# Patient Record
Sex: Female | Born: 1945 | Race: White | Hispanic: No | Marital: Married | Smoking: Never smoker
Health system: Southern US, Community
[De-identification: ages and names within clinical notes are randomized; demographics above are authoritative.]

## PROBLEM LIST (undated history)

## (undated) DIAGNOSIS — E876 Hypokalemia: Secondary | ICD-10-CM

## (undated) DIAGNOSIS — C50919 Malignant neoplasm of unspecified site of unspecified female breast: Secondary | ICD-10-CM

## (undated) DIAGNOSIS — J939 Pneumothorax, unspecified: Secondary | ICD-10-CM

## (undated) DIAGNOSIS — Z9889 Other specified postprocedural states: Secondary | ICD-10-CM

## (undated) DIAGNOSIS — J189 Pneumonia, unspecified organism: Secondary | ICD-10-CM

## (undated) DIAGNOSIS — D696 Thrombocytopenia, unspecified: Secondary | ICD-10-CM

## (undated) DIAGNOSIS — I158 Other secondary hypertension: Secondary | ICD-10-CM

## (undated) DIAGNOSIS — R011 Cardiac murmur, unspecified: Secondary | ICD-10-CM

## (undated) DIAGNOSIS — D6851 Activated protein C resistance: Secondary | ICD-10-CM

## (undated) DIAGNOSIS — E7800 Pure hypercholesterolemia, unspecified: Secondary | ICD-10-CM

## (undated) DIAGNOSIS — D62 Acute posthemorrhagic anemia: Secondary | ICD-10-CM

## (undated) DIAGNOSIS — R739 Hyperglycemia, unspecified: Secondary | ICD-10-CM

## (undated) DIAGNOSIS — I340 Nonrheumatic mitral (valve) insufficiency: Secondary | ICD-10-CM

## (undated) DIAGNOSIS — I341 Nonrheumatic mitral (valve) prolapse: Secondary | ICD-10-CM

## (undated) DIAGNOSIS — D72829 Elevated white blood cell count, unspecified: Secondary | ICD-10-CM

## (undated) DIAGNOSIS — E871 Hypo-osmolality and hyponatremia: Secondary | ICD-10-CM

## (undated) HISTORY — DX: Malignant neoplasm of unspecified site of unspecified female breast: C50.919

## (undated) HISTORY — DX: Thrombocytopenia, unspecified: D69.6

## (undated) HISTORY — DX: Nonrheumatic mitral (valve) prolapse: I34.1

## (undated) HISTORY — DX: Nonrheumatic mitral (valve) insufficiency: I34.0

## (undated) HISTORY — DX: Hypokalemia: E87.6

## (undated) HISTORY — PX: MASTECTOMY: SHX3

## (undated) HISTORY — DX: Hyperglycemia, unspecified: R73.9

## (undated) HISTORY — DX: Acute posthemorrhagic anemia: D62

## (undated) HISTORY — DX: Other secondary hypertension: I15.8

## (undated) HISTORY — DX: Activated protein C resistance: D68.51

## (undated) HISTORY — DX: Hypo-osmolality and hyponatremia: E87.1

## (undated) HISTORY — DX: Pneumothorax, unspecified: J93.9

## (undated) HISTORY — DX: Pure hypercholesterolemia, unspecified: E78.00

## (undated) HISTORY — DX: Elevated white blood cell count, unspecified: D72.829

---
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
## 2008-11-23 HISTORY — PX: MASTECTOMY: SHX3

## 2009-11-23 DIAGNOSIS — J939 Pneumothorax, unspecified: Secondary | ICD-10-CM

## 2009-11-23 HISTORY — DX: Pneumothorax, unspecified: J93.9

## 2009-11-23 HISTORY — PX: ROTATOR CUFF REPAIR: SHX139

## 2010-11-23 DIAGNOSIS — C50919 Malignant neoplasm of unspecified site of unspecified female breast: Secondary | ICD-10-CM

## 2010-11-23 HISTORY — DX: Malignant neoplasm of unspecified site of unspecified female breast: C50.919

## 2014-08-30 LAB — PULMONARY FUNCTION TEST

## 2015-08-13 ENCOUNTER — Ambulatory Visit (INDEPENDENT_AMBULATORY_CARE_PROVIDER_SITE_OTHER): Payer: Medicare Other | Admitting: Pulmonary Disease

## 2015-08-13 ENCOUNTER — Ambulatory Visit (INDEPENDENT_AMBULATORY_CARE_PROVIDER_SITE_OTHER)
Admission: RE | Admit: 2015-08-13 | Discharge: 2015-08-13 | Disposition: A | Discharge status: Home / Self Care | Payer: Medicare Other | Source: Ambulatory Visit | Attending: Pulmonary Disease | Admitting: Pulmonary Disease

## 2015-08-13 ENCOUNTER — Encounter: Payer: Self-pay | Admitting: Pulmonary Disease

## 2015-08-13 VITALS — BP 118/66 | HR 58 | Ht 62.5 in | Wt 102.0 lb

## 2015-08-13 DIAGNOSIS — R05 Cough: Secondary | ICD-10-CM

## 2015-08-13 DIAGNOSIS — R059 Cough, unspecified: Secondary | ICD-10-CM

## 2015-08-13 HISTORY — DX: Cough, unspecified: R05.9

## 2015-08-13 MED ORDER — BECLOMETHASONE DIPROPIONATE 80 MCG/ACT IN AERS: 2.0000 | INHALATION_SPRAY | Freq: Two times a day (BID) | RESPIRATORY_TRACT | Status: DC

## 2015-08-13 NOTE — Progress Notes (Signed)
Subjective:    Patient ID: Laura Galvan, female    DOB: 08/29/1946, 69 y.o.   MRN: [id number]  HPI Chief Complaint  Patient presents with  . Advice Only    Pt here for chronic cough X4 mos.     Magdaline is here to see me today for a cough that's been persistent since May. She says it came out of the blue without any sort of preceding infection or other problem. She said that it has persisted since without much mucus production. She says that there is no difference when she lays flat or when she is awake. She has noticed that the cough is worse with talking. There does not seem to be a correlation with a meal. She says sometimes it feels like ice water may make her cough. She denies heartburn symptoms. She says that she will have allergic rhinitis type symptoms with postnasal drip and itchy eyes after a coughing spell that she does not have these before the coughing spell. She says the cough does cause her to wake up at night. After several months of expansion treatments she finally went to the doctor in the end of August and she was prescribed dymista, this did not help. She has also used a proton pump inhibitor twice a day as well as daily Zyrtec. She has been elevating the head of her bed. She's not certain if any of these have helped.  She's never smoked. She has a history of breast cancer and a mastectomy. She says these thinks that she had childhood asthma. She worked in [employer] house [employer].  About a year ago her oncologist said that she heard abnormal breath sounds and so she started with a chest x-ray, and then finally her primary care physician ordered a pulmonary function test and a CT scan. She believes that these were normal.   Past Medical History  Diagnosis Date  . Hypercholesteremia   . Mitral valve prolapse      Family History  Problem Relation Age of Onset  . Heart disease Father   . Clotting disorder      "whole family" per pt.   . Lung cancer Father   . Lung cancer Mother      Social History   Social History  . Marital Status: Married    Spouse Name: N/A  . Number of Children: N/A  . Years of Education: N/A   Occupational History  . Not on file.   Social History Main Topics  . Smoking status: Never Smoker   . Smokeless tobacco: Never Used  . Alcohol Use: Not on file  . Drug Use: Not on file  . Sexual Activity: Not on file   Other Topics Concern  . Not on file   Social History Narrative  . No narrative on file     Allergies  Allergen Reactions  . Sulfa Antibiotics     Childhood allergy     No outpatient prescriptions prior to visit.   No facility-administered medications prior to visit.       Review of Systems  Constitutional: Negative for fever and unexpected weight change.  HENT: Positive for rhinorrhea. Negative for congestion, dental problem, ear pain, nosebleeds, postnasal drip, sinus pressure, sneezing, sore throat and trouble swallowing.   Eyes: Negative for redness and itching.  Respiratory: Positive for cough. Negative for chest tightness, shortness of breath and wheezing.   Cardiovascular: Negative for palpitations and leg swelling.  Gastrointestinal: Negative for nausea and vomiting.  Genitourinary: Negative  for dysuria.  Musculoskeletal: Negative for joint swelling.  Skin: Negative for rash.  Neurological: Negative for headaches.  Hematological: Does not bruise/bleed easily.  Psychiatric/Behavioral: Negative for dysphoric mood. The patient is not nervous/anxious.        Objective:   Physical Exam Filed Vitals:   08/13/15 1324  BP: 118/66  Pulse: 58  Height: 5' 2.5" (1.588 m)  Weight: 102 lb (46.267 kg)  SpO2: 97%     Gen: well appearing, no acute distress HENT: NCAT, OP clear, neck supple without masses Eyes: PERRL, EOMi Lymph: no cervical lymphadenopathy PULM: wheezing bases bilaterally, good air movement CV: RRR, systolic murmur noted, no JVD GI: BS+, soft, nontender, no hsm Derm: no rash or skin  breakdown MSK: normal bulk and tone Neuro: A&Ox4, CN II-XII intact, strength 5/5 in all 4 extremities Psyche: normal mood and affect  Chest x-ray report available to me from August 2016 is normal    Assessment & Plan:  Cough She has struggled with cough for the last several months and the underlying cause is not certain. It started spontaneously without an upper respiratory infection or other problems though she did note some allergic rhinitis preceding it. She says her allergy symptoms have improved. I explained to her today that the most common causes of chronic cough in the [place] her postnasal drip (it does not sound she has much of this), GERD (she denies but is currently receiving treatment for), underlying lung disease, active smoking, or asthma. She has a history of childhood asthma and on exam today she does have some mild wheezing in the bases of her lungs so I wonder whether or not there is some degree of airways inflammation such as low-level asthma or eosinophilic bronchitis. Ongoing laryngeal irritation (irritable larynx syndrome) can also cause a persistent cough like this.  Interestingly she said that she was assessed for lung disease about a year ago when her primary care physician heard abnormal lung sounds. I will need to request the results of the CT scan and pulmonary function testing that were done at that time.  Plan: Simple spirometry today Start Qvar empirically for asthma/eosinophilic bronchitis Use albuterol as needed for cough Add nasal steroid for allergic rhinitis symptoms, continue anti-histamine and proton pump inhibitor prescribed by primary care physician Voice rest was encouraged Chest x-ray today Obtain results from pulmonary function test and CT scan from 2015 If no improvement in 3 weeks then come back and we will repeat a pulmonary infection test and see her here in clinic.     Current outpatient prescriptions:  .  atorvastatin (LIPITOR) 10 MG  tablet, Take 10 mg by mouth daily., Disp: , Rfl:  .  Calcium Carbonate-Vitamin D (CALTRATE 600+D PO), Take 1 tablet by mouth daily., Disp: , Rfl:  .  cetirizine (ZYRTEC) 10 MG chewable tablet, Chew 10 mg by mouth daily., Disp: , Rfl:  .  omeprazole (PRILOSEC) 40 MG capsule, Take 40 mg by mouth 2 (two) times daily., Disp: , Rfl:  .  beclomethasone (QVAR) 80 MCG/ACT inhaler, Inhale 2 puffs into the lungs 2 (two) times daily., Disp: 1 Inhaler, Rfl: 6

## 2015-08-13 NOTE — Patient Instructions (Addendum)
Take Qvar 2 puffs twice a day no matter how you feel Use albuterol 1 puff as needed for coughing spells I would like for you to use a nasal steroid consistently for 2-3 weeks, you can use Dymista or OTC nasacort We will call you with results of the chest x-ray  We will request records of your CT scan and PFT from last year  You need to try to suppress your cough to allow your larynx (voice box) to heal.  For three days don't talk, laugh, sing, or clear your throat. Do everything you can to suppress the cough during this time. Use hard candies (sugarless Jolly Ranchers) or non-mint or non-menthol containing cough drops during this time to soothe your throat.  Use a cough suppressant (Delsym) around the clock during this time.  After three days, gradually increase the use of your voice and back off on the cough suppressants.  If you are not better in 3 weeks let me know and we will order a lung function test here

## 2015-08-13 NOTE — Assessment & Plan Note (Signed)
She has struggled with cough for the last several months and the underlying cause is not certain. It started spontaneously without an upper respiratory infection or other problems though she did note some allergic rhinitis preceding it. She says her allergy symptoms have improved. I explained to her today that the most common causes of chronic cough in the [place] her postnasal drip (it does not sound she has much of this), GERD (she denies but is currently receiving treatment for), underlying lung disease, active smoking, or asthma. She has a history of childhood asthma and on exam today she does have some mild wheezing in the bases of her lungs so I wonder whether or not there is some degree of airways inflammation such as low-level asthma or eosinophilic bronchitis. Ongoing laryngeal irritation (irritable larynx syndrome) can also cause a persistent cough like this.  Interestingly she said that she was assessed for lung disease about a year ago when her primary care physician heard abnormal lung sounds. I will need to request the results of the CT scan and pulmonary function testing that were done at that time.  Plan: Simple spirometry today Start Qvar empirically for asthma/eosinophilic bronchitis Use albuterol as needed for cough Add nasal steroid for allergic rhinitis symptoms, continue anti-histamine and proton pump inhibitor prescribed by primary care physician Voice rest was encouraged Chest x-ray today Obtain results from pulmonary function test and CT scan from 2015 If no improvement in 3 weeks then come back and we will repeat a pulmonary infection test and see her here in clinic.

## 2015-08-27 ENCOUNTER — Telehealth: Payer: Self-pay | Admitting: Pulmonary Disease

## 2015-08-27 DIAGNOSIS — R05 Cough: Secondary | ICD-10-CM

## 2015-08-27 DIAGNOSIS — R059 Cough, unspecified: Secondary | ICD-10-CM

## 2015-08-27 NOTE — Telephone Encounter (Signed)
Patient Instructions     Take Qvar 2 puffs twice a day no matter how you feel Use albuterol 1 puff as needed for coughing spells I would like for you to use a nasal steroid consistently for 2-3 weeks, you can use Dymista or OTC nasacort We will call you with results of the chest x-ray  We will request records of your CT scan and PFT from last year  You need to try to suppress your cough to allow your larynx (voice box) to heal. For three days don't talk, laugh, sing, or clear your throat. Do everything you can to suppress the cough during this time. Use hard candies (sugarless Jolly Ranchers) or non-mint or non-menthol containing cough drops during this time to soothe your throat. Use a cough suppressant (Delsym) around the clock during this time. After three days, gradually increase the use of your voice and back off on the cough suppressants.  If you are not better in 3 weeks let me know and we will order a lung function test here   Spoke with pt. States that Qvar, Albuterol and Dymista are not working for her. Would like to know what BQ wants her to do from here. Advised her that BQ is on vacation this week and would not be back until next week. She verbalized understanding.  BQ - please advise. Thanks.

## 2015-09-03 NOTE — Telephone Encounter (Signed)
Called and spoke with pt Pt stated having CT scan done at [facility] in [place], [place] and was informed that release would need to be signed from pt to release results  Called pt back and informed he that when she come to office tomorrow to sign release form before leaving Fax # [phone] Will fax once completed  Sending to Dr Lake Bells as Juluis Rainier

## 2015-09-03 NOTE — Telephone Encounter (Signed)
Dr. McQuaid, please advise. 

## 2015-09-03 NOTE — Telephone Encounter (Signed)
She needs a PFT We need to get the report and the disc of her CT scan of her chest from a year ago She needs to follow up with me after the PFT, ideally same day as she lives in [place]

## 2015-09-03 NOTE — Telephone Encounter (Signed)
We need to call the place where she had the CT scan and request the result, she doesn't need to do anything other than sign a release if necessary.  Find out where she had the CT scan performed and request the images and the report from there.

## 2015-09-03 NOTE — Telephone Encounter (Signed)
thanks

## 2015-09-03 NOTE — Telephone Encounter (Signed)
Patient is currently in town due to [facility] evacuation.  She is staying in [place].  She requested that she go ahead and get the PFT done this week while she is in [place].  Scheduled patient to have PFT done tomorrow at 11:00pm.  Patient says she will be leaving [place] on Friday.  She said she will not be able to obtain the CT results until she returns home. Patient will call to schedule follow up appointment as soon as she gets home and can obtain CT results.  FYI to Dr. Lake Bells

## 2015-09-04 ENCOUNTER — Ambulatory Visit (INDEPENDENT_AMBULATORY_CARE_PROVIDER_SITE_OTHER): Payer: Medicare Other | Admitting: Pulmonary Disease

## 2015-09-04 DIAGNOSIS — R059 Cough, unspecified: Secondary | ICD-10-CM

## 2015-09-04 DIAGNOSIS — R05 Cough: Secondary | ICD-10-CM | POA: Diagnosis not present

## 2015-09-04 LAB — PULMONARY FUNCTION TEST
DL/VA % PRED: 112 %
DL/VA: 5.26 ml/min/mmHg/L
DLCO UNC % PRED: 89 %
DLCO UNC: 20.52 ml/min/mmHg
FEF 25-75 POST: 1.92 L/s
FEF 25-75 PRE: 1.32 L/s
FEF2575-%Change-Post: 45 %
FEF2575-%PRED-POST: 103 %
FEF2575-%PRED-PRE: 71 %
FEV1-%CHANGE-POST: 8 %
FEV1-%Pred-Post: 88 %
FEV1-%Pred-Pre: 81 %
FEV1-Post: 1.93 L
FEV1-Pre: 1.78 L
FEV1FVC-%Change-Post: 6 %
FEV1FVC-%PRED-PRE: 97 %
FEV6-%CHANGE-POST: 1 %
FEV6-%PRED-POST: 88 %
FEV6-%Pred-Pre: 86 %
FEV6-Post: 2.42 L
FEV6-Pre: 2.38 L
FEV6FVC-%CHANGE-POST: 0 %
FEV6FVC-%Pred-Post: 105 %
FEV6FVC-%Pred-Pre: 105 %
FVC-%Change-Post: 1 %
FVC-%PRED-POST: 84 %
FVC-%Pred-Pre: 82 %
FVC-Post: 2.43 L
FVC-Pre: 2.39 L
POST FEV1/FVC RATIO: 80 %
PRE FEV1/FVC RATIO: 74 %
PRE FEV6/FVC RATIO: 100 %
Post FEV6/FVC ratio: 100 %
RV % pred: 83 %
RV: 1.79 L
TLC % pred: 88 %
TLC: 4.33 L

## 2015-09-04 NOTE — Progress Notes (Signed)
PFT done today. 

## 2015-09-04 NOTE — Telephone Encounter (Signed)
Received signature on authorization request. Faxed to [facility] Nothing further needed.

## 2015-09-06 ENCOUNTER — Telehealth: Payer: Self-pay | Admitting: Pulmonary Disease

## 2015-09-06 NOTE — Telephone Encounter (Signed)
Please check on the status of our records request from the CT chest performed one year ago

## 2015-09-06 NOTE — Progress Notes (Signed)
Quick Note:  lmomtcb for pt on home and cell #s ______ 

## 2015-09-06 NOTE — Telephone Encounter (Signed)
Spoke to medical records dept with [facility] in [place] about CT results being faxed Informed that when authorization form was faxed on 09/04/15 their fax machine was down and nothing was transmitted all day Asked medical records if there was any way they could fax results since we had attempted to fax authorization form and was told no  Signed authorization form has been sent to scan center and is not currently in the system Will have to call on Monday to see if scan center can fax authorization form to office so we can fax again to medical center  Will hold in triage until Monday

## 2015-09-06 NOTE — Telephone Encounter (Signed)
Notes Recorded by Adalberto Ill, RN on 09/06/2015 at 10:31 AM lmomtcb for pt on home and cell #s. Notes Recorded by Juanito Doom, MD on 09/05/2015 at 5:32 PM C, Please let the patient know this was OK Thanks, B -------------------- Pt is aware of results. She states that she would like to know where she goes from here. Still has a cough that won't go away.  BQ- please advise. Thanks.

## 2015-09-09 NOTE — Telephone Encounter (Signed)
Patient is calling for results.  She is in [place] today and will be back in [place] tomorrow afternoon through Thursday in case he needs to see her.  Patient can be reached at [phone].

## 2015-09-09 NOTE — Telephone Encounter (Signed)
The form still has not reached the scan center. Was advised to try back later this afternoon or tomorrow.

## 2015-09-10 NOTE — Telephone Encounter (Signed)
[phone], pt cb

## 2015-09-10 NOTE — Telephone Encounter (Signed)
Called scan center at [phone] and they have not received the release form. LMTCB for pt to see if she is able to get the past CT.

## 2015-09-10 NOTE — Telephone Encounter (Signed)
Patient called Laura Galvan back.  She recd a copy of the report and the CT scan that Dr. Lake Bells sent.  She will bring it in around 8 am in the morning.  Patient can be reached at [phone] if you have any questions.

## 2015-09-10 NOTE — Telephone Encounter (Signed)
Called and spoke with pt and advised her about issue with form not being able to be located to get CT results from [place] stated that she was in [place] now and would stop by office to get information She stated that she would drop off images and report tomorrow to Dr Lake Bells for review   Nothing further is needed at this time.   Will send to Dr Lake Bells and Caryl Pina as a Juluis Rainier

## 2015-09-11 ENCOUNTER — Telehealth: Payer: Self-pay | Admitting: Pulmonary Disease

## 2015-09-11 DIAGNOSIS — R059 Cough, unspecified: Secondary | ICD-10-CM

## 2015-09-11 DIAGNOSIS — R05 Cough: Secondary | ICD-10-CM

## 2015-09-12 ENCOUNTER — Ambulatory Visit (INDEPENDENT_AMBULATORY_CARE_PROVIDER_SITE_OTHER)
Admission: RE | Admit: 2015-09-12 | Discharge: 2015-09-12 | Disposition: A | Discharge status: Home / Self Care | Payer: Medicare Other | Source: Ambulatory Visit | Attending: Pulmonary Disease | Admitting: Pulmonary Disease

## 2015-09-12 DIAGNOSIS — R059 Cough, unspecified: Secondary | ICD-10-CM

## 2015-09-12 DIAGNOSIS — R05 Cough: Secondary | ICD-10-CM | POA: Diagnosis not present

## 2015-09-12 DIAGNOSIS — J479 Bronchiectasis, uncomplicated: Secondary | ICD-10-CM

## 2015-09-12 NOTE — Telephone Encounter (Signed)
Per BQ, pt needs repeat CT based on these findings.  Pt aware.  Pt in office and gave her papers back to her.  Nothing further needed.

## 2015-09-13 DIAGNOSIS — J479 Bronchiectasis, uncomplicated: Secondary | ICD-10-CM

## 2015-09-13 HISTORY — DX: Bronchiectasis, uncomplicated: J47.9

## 2015-09-18 ENCOUNTER — Telehealth: Payer: Self-pay | Admitting: Pulmonary Disease

## 2015-09-18 NOTE — Telephone Encounter (Signed)
Result Notes     Notes Recorded by Glean Hess, CMA on 09/16/2015 at 3:28 PM lmtcb ------  Notes Recorded by Juanito Doom, MD on 09/13/2015 at 9:37 PM Caryl Pina, Her CT scan showed bronchiectasis, please let her know. It appears to be mild. However, this likely explains the reasoning for her cough. I would like to talk to her about this next clinic visit. Please let me know when she is coming back. Thanks, Ruby Cola   Pt is aware of results. She does not have an upcoming appointment. Offered the next available (09/24/15) with BQ but she declined due to her being out of town. The next available will be 11/05/15 and she feels this is to far out. Wants to know if there is medication BQ wants her to start, if so she would to go ahead and do this.  BQ - can pt wait until 11/05/15?

## 2015-09-18 NOTE — Telephone Encounter (Signed)
She can try stopping other inhaled medications and starting symbicort 80/4.5 2 puffs bid  12/13 too far out   I am OK with an overbook sooner.

## 2015-09-19 MED ORDER — BUDESONIDE-FORMOTEROL FUMARATE 80-4.5 MCG/ACT IN AERO: 2.0000 | INHALATION_SPRAY | Freq: Two times a day (BID) | RESPIRATORY_TRACT | Status: DC

## 2015-09-19 NOTE — Telephone Encounter (Signed)
Spoke with the pt and notified of appt date/time  She verbalized understanding and nothing further needed

## 2015-09-19 NOTE — Telephone Encounter (Signed)
OV scheduled for 12.13.16, will inform pt when she calls back.

## 2015-09-19 NOTE — Telephone Encounter (Signed)
I spoke with Laura Galvan on the phone this morning and explained that she has mild bronchiectasis. She told me that she had a severe case of pneumonia about 3 or 4 years ago. She also had pneumonia as a child.  I instructed her to stop taking Qvar. We will start Symbicort 2 puffs twice a day.  If there is no improvement after 4-6 weeks of therapy then I will change focused to decreasing the sensitivity of her upper airway.  I would like for her to come back and see me on December 13.  I will copy this message to triage to make sure that she comes back to see [place] on that date.  Roselie Awkward, MD [facility] Pager: [phone] Cell: [phone] After 3pm or if no response, call [phone]

## 2015-09-19 NOTE — Telephone Encounter (Signed)
LVM for pt to return call to schedule ov.

## 2015-11-05 ENCOUNTER — Ambulatory Visit (INDEPENDENT_AMBULATORY_CARE_PROVIDER_SITE_OTHER): Payer: Medicare Other | Admitting: Pulmonary Disease

## 2015-11-05 ENCOUNTER — Encounter: Payer: Self-pay | Admitting: Pulmonary Disease

## 2015-11-05 VITALS — BP 118/66 | HR 61 | Ht 62.5 in | Wt 105.0 lb

## 2015-11-05 DIAGNOSIS — J479 Bronchiectasis, uncomplicated: Secondary | ICD-10-CM

## 2015-11-05 DIAGNOSIS — R059 Cough, unspecified: Secondary | ICD-10-CM

## 2015-11-05 DIAGNOSIS — R05 Cough: Secondary | ICD-10-CM

## 2015-11-05 MED ORDER — FLUTTER DEVI: Status: DC

## 2015-11-05 NOTE — Progress Notes (Signed)
Subjective:    Patient ID: Laura Galvan, female    DOB: [birth year], 69 y.o.   MRN: [id number]  Synopsis: Referred in 2016 for evaluation of chronic cough. Found to have mild bronchiectasis. Has a distant history of breast cancer. October 2016 pulmonary function testing ratio 80%, FEV1 1.93 L (88% predicted), FVC 2.43 L (43% predicted), total lung capacity 4.33 L (88% predicted), DLCO 20.52 (89% predicted). October 2016 CT chest with areas of mild bronchiectasis bilaterally, no other pulmonary parenchymal abnormality  HPI Chief Complaint  Patient presents with  . Follow-up    pt still c/o nonprod cough through all day.  pt saw an allergist for this also, has results to review today.      Laura Galvan was seen by an allergist a few weeks ago and was found to have mulitple allergist including dust mite, cat, some tree.  She has started on allergy injections in the last two weeks.  No the cough has steadily improved, but it still persistent.  The inhaled therapy doesn't help.  She says taht the cough is worse on days when she talks a lot. She denies shortness of breath or chest pain. No fevers chills or weight loss.   Past Medical History  Diagnosis Date  . Hypercholesteremia   . Mitral valve prolapse       Review of Systems     Objective:   Physical Exam Filed Vitals:   11/05/15 1006  BP: 118/66  Pulse: 61  Height: 5' 2.5" (1.588 m)  Weight: 105 lb (47.628 kg)  SpO2: 99%   Gen: well appearing HENT: OP clear, TM's clear, neck supple PULM: CTA B, normal percussion CV: RRR, no mgr, trace edema GI: BS+, soft, nontender Derm: no cyanosis or rash Psyche: normal mood and affect        Assessment & Plan:  Cough I believe that her cough is primarily due to upper airway irritation in her larynx. She has recently been found to have a significant amount of allergies and so I agree completely with immunotherapy. I also encouraged her today to rest her voice is much as  possible.  Bronchiectasis ([place]) She has very mild varicose bronchiectasis which I believe is related to a severe episode of pneumonia approximately 2 years ago. Fortunately this is not seem to be causing any other problems and at this time I do not think it's causing her cough. She did not benefit from 2 separate bronchodilator and inhaled steroid combination therapy so I see no benefit to further using this. I did explain to her today that should she have an infection that she would be at increased risk for a bacterial superinfection so she should be treated with antibiotics sooner rather then later should she develop bronchitis. Specifically, I would recommend treating her with an antipseudomonal therapy like Levaquin.  Plan: Stop Symbicort Flutter valve provided for episodes of bronchitis Encouraged to use Mucinex twice a day when she has bronchitis Encouraged her to see a physician immediately for antibody therapy if she has bronchitis Follow-up as needed     Current outpatient prescriptions:  .  atorvastatin (LIPITOR) 10 MG tablet, Take 10 mg by mouth daily., Disp: , Rfl:  .  budesonide-formoterol (SYMBICORT) 80-4.5 MCG/ACT inhaler, Inhale 2 puffs into the lungs 2 (two) times daily., Disp: 1 Inhaler, Rfl: 12 .  Calcium Carbonate-Vitamin D (CALTRATE 600+D PO), Take 1 tablet by mouth daily., Disp: , Rfl:  .  cetirizine (ZYRTEC) 10 MG chewable tablet, Chew 10 mg  by mouth daily., Disp: , Rfl:  .  Respiratory Therapy Supplies (FLUTTER) DEVI, Use as directed, Disp: 1 each, Rfl: 0

## 2015-11-05 NOTE — Patient Instructions (Signed)
If you have a cough with mucus production I recommend that you see a physician to be treated with antibiotics sooner rather than later on the basis of your bronchiectasis If this occurs I recommend that you take guaifenesin 1200 mg extended release twice a day and you use a flutter valve 4-5 breaths, 4-5 times a day  We will see you back on an as-needed basis

## 2015-11-05 NOTE — Assessment & Plan Note (Signed)
She has very mild varicose bronchiectasis which I believe is related to a severe episode of pneumonia approximately 2 years ago. Fortunately this is not seem to be causing any other problems and at this time I do not think it's causing her cough. She did not benefit from 2 separate bronchodilator and inhaled steroid combination therapy so I see no benefit to further using this. I did explain to her today that should she have an infection that she would be at increased risk for a bacterial superinfection so she should be treated with antibiotics sooner rather then later should she develop bronchitis. Specifically, I would recommend treating her with an antipseudomonal therapy like Levaquin.  Plan: Stop Symbicort Flutter valve provided for episodes of bronchitis Encouraged to use Mucinex twice a day when she has bronchitis Encouraged her to see a physician immediately for antibody therapy if she has bronchitis Follow-up as needed

## 2015-11-05 NOTE — Assessment & Plan Note (Signed)
I believe that her cough is primarily due to upper airway irritation in her larynx. She has recently been found to have a significant amount of allergies and so I agree completely with immunotherapy. I also encouraged her today to rest her voice is much as possible.

## 2016-04-02 IMAGING — CT CT CHEST W/O CM
2 of 3 series · 15 of 36 positions shown, 18 images · IV contrast (Omnipaque 300)
Comparison: None

CLINICAL DATA: Cough.

EXAM:
CT CHEST WITHOUT CONTRAST
TECHNIQUE: Multidetector CT imaging of the chest was performed following the
standard protocol without IV contrast.

[Series 2: chest routine with · axial · 0.61mm/px · z∈[-254,-24]mm · 12 of 56 slices shown, 15 images]
[im 5/56  mediastinal]
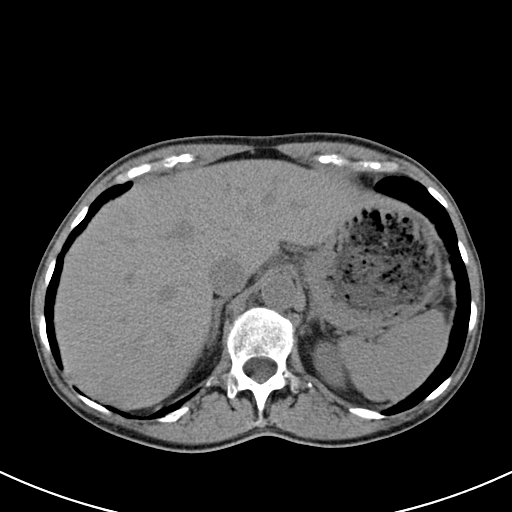
[im 5/56  lung]
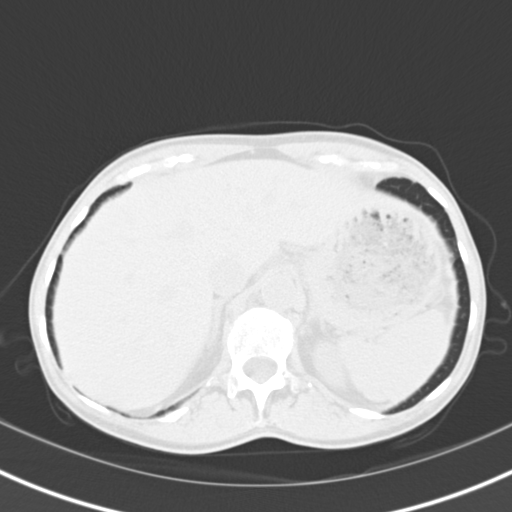
[im 9/56  lung]
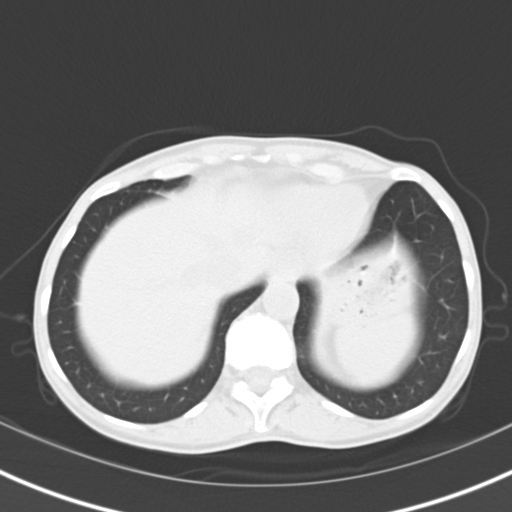
[im 13/56  lung]
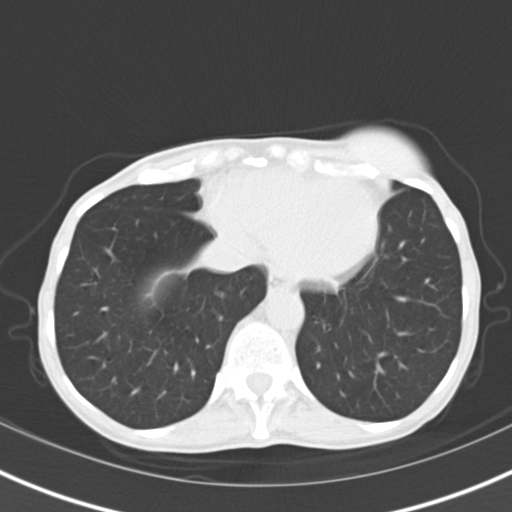
[im 17/56  lung]
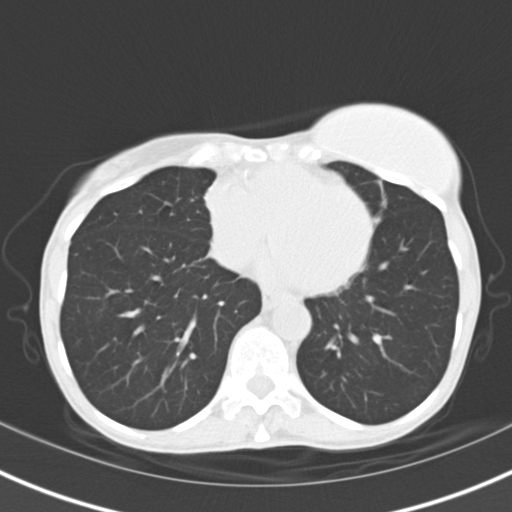
[im 21/56  mediastinal]
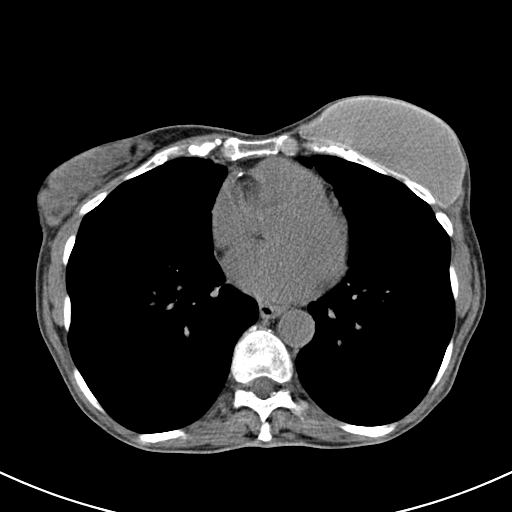
[im 21/56  lung]
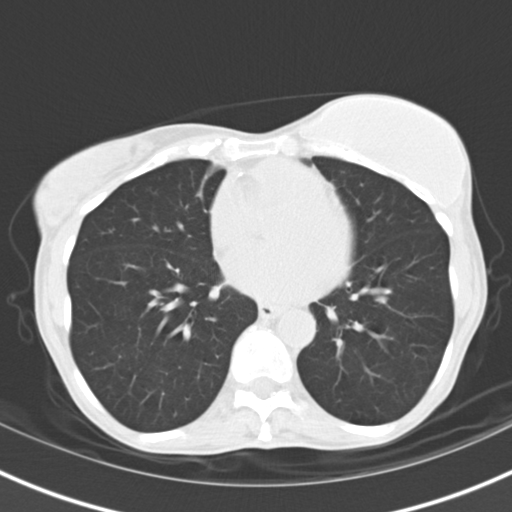
[im 25/56  lung]
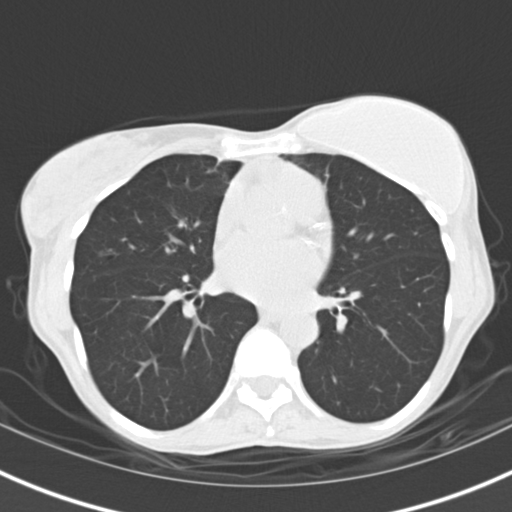
[im 31/56  lung]
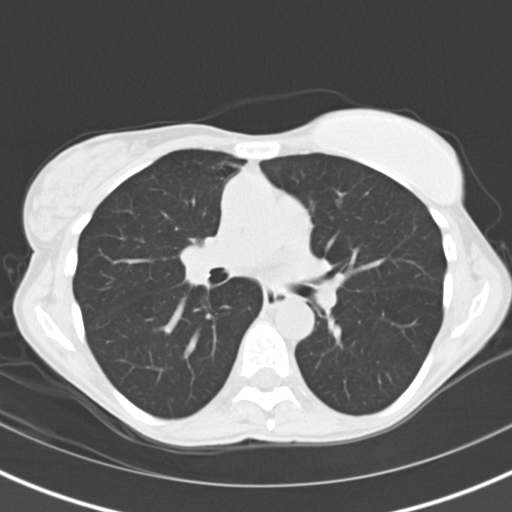
[im 35/56  lung]
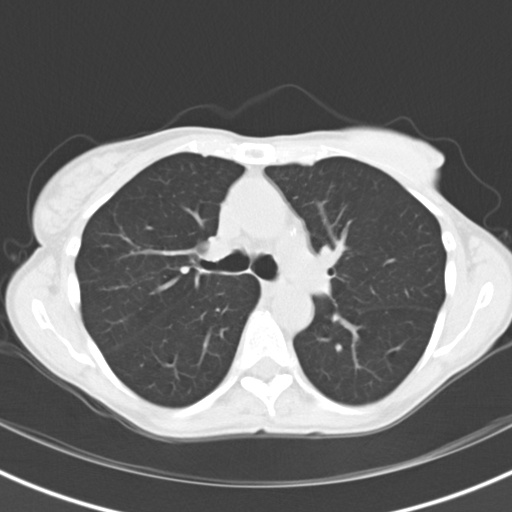
[im 39/56  mediastinal]
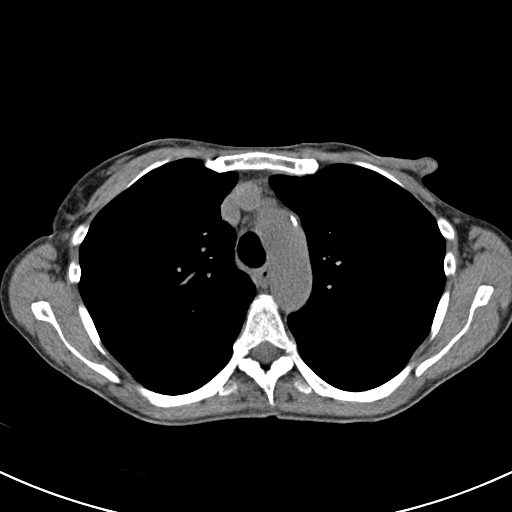
[im 39/56  lung]
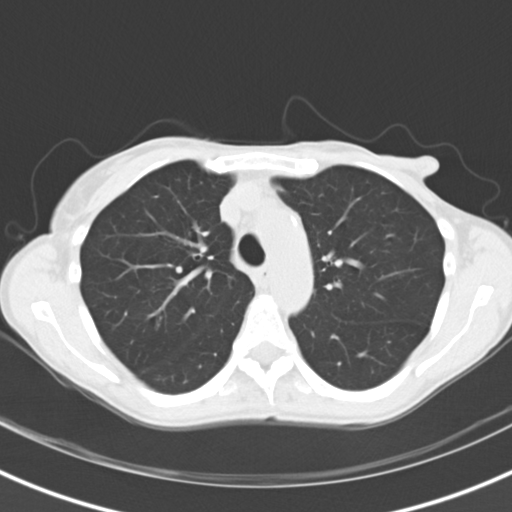
[im 43/56  lung]
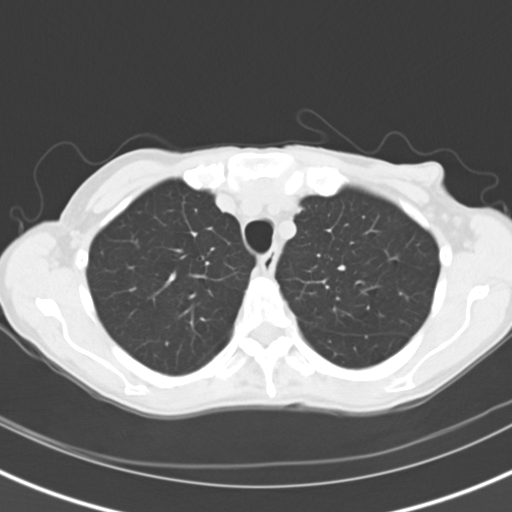
[im 47/56  lung]
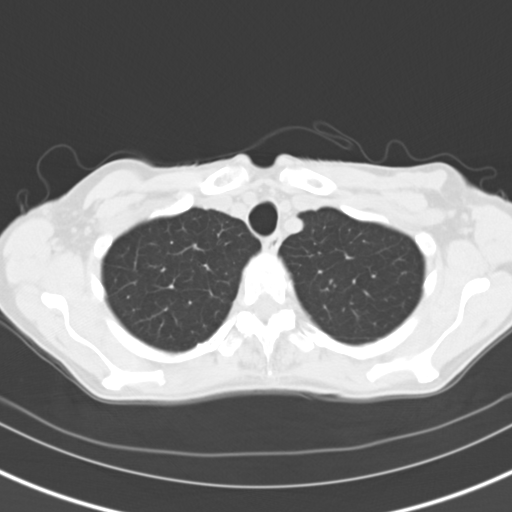
[im 51/56  lung]
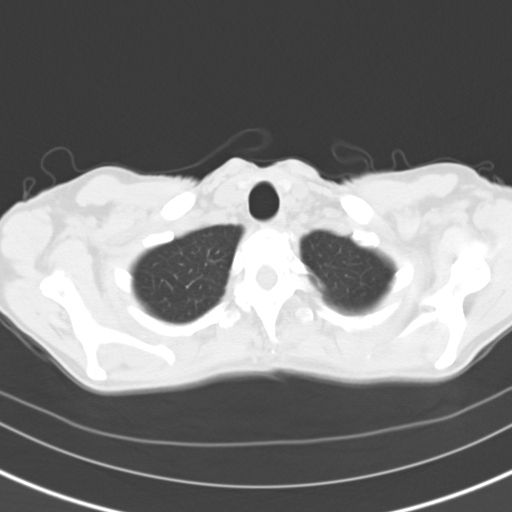

[Series 602: cor · coronal · 0.61mm/px · 3 of 99 slices shown]
[im 20/99  lung]
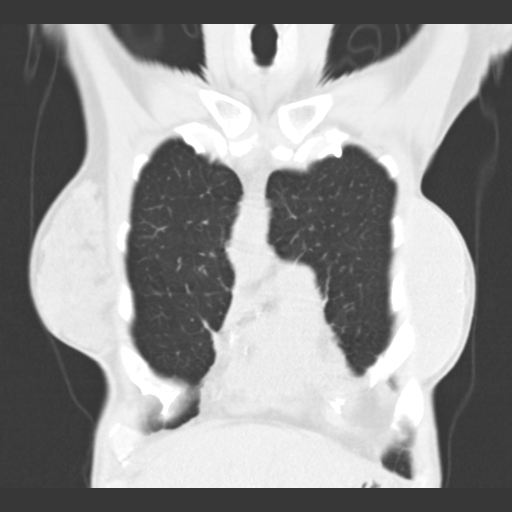
[im 40/99  lung]
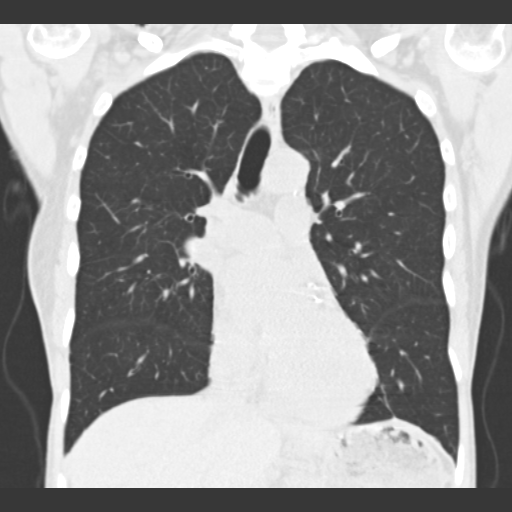
[im 59/99  lung]
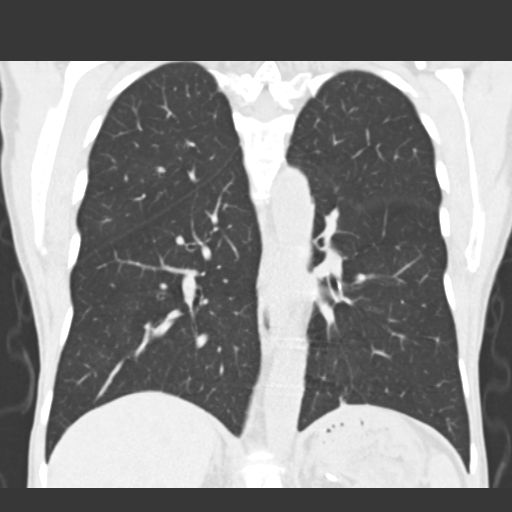

[15 of 36 positions shown; findings below may reference images not displayed]

FINDINGS: Mediastinum: Heart size appears normal. No pericardial effusion.
Aortic atherosclerosis noted. Calcification within the LAD, RCA and
left circumflex coronary arteries noted. The trachea is patent and
midline. Normal appearance of the esophagus. No mediastinal or hilar
adenopathy. No supraclavicular or axillary adenopathy. Status post
left mastectomy and left axillary nodal dissection. Left breast
prosthesis is in place.

Lungs/Pleura: Bilateral areas of varicoid bronchiectasis identified.
Within the mild anterior right middle lobe scarring also noted. No
airspace consolidation. No nodularity.

Upper Abdomen: No focal liver abnormalities. The gallbladder is
normal. The adrenal glands are unremarkable. Visualized portions of
the pancreas and kidneys are normal.

Musculoskeletal: No aggressive lytic or sclerotic bone lesions.
IMPRESSION: 1. Evidence of varicoid bronchiectasis identified bilaterally.
2. No acute cardiopulmonary abnormalities.
3. Aortic atherosclerosis and multi vessel coronary artery
calcification.

## 2016-07-09 ENCOUNTER — Telehealth (HOSPITAL_COMMUNITY): Payer: Self-pay | Admitting: *Deleted

## 2016-07-09 DIAGNOSIS — I341 Nonrheumatic mitral (valve) prolapse: Secondary | ICD-10-CM

## 2016-07-09 NOTE — Telephone Encounter (Signed)
Echo ordered per Dr Haroldine Laws

## 2016-07-10 ENCOUNTER — Ambulatory Visit (HOSPITAL_COMMUNITY)
Admission: RE | Admit: 2016-07-10 | Discharge: 2016-07-10 | Disposition: A | Discharge status: Home / Self Care | Payer: Medicare Other | Source: Ambulatory Visit | Attending: Neurology | Admitting: Neurology

## 2016-07-10 ENCOUNTER — Other Ambulatory Visit: Payer: Self-pay | Admitting: *Deleted

## 2016-07-10 ENCOUNTER — Institutional Professional Consult (permissible substitution) (INDEPENDENT_AMBULATORY_CARE_PROVIDER_SITE_OTHER): Payer: Medicare Other | Admitting: Thoracic Surgery (Cardiothoracic Vascular Surgery)

## 2016-07-10 ENCOUNTER — Encounter: Payer: Self-pay | Admitting: Thoracic Surgery (Cardiothoracic Vascular Surgery)

## 2016-07-10 VITALS — BP 155/96 | HR 63 | Resp 18 | Ht 62.5 in | Wt 105.0 lb

## 2016-07-10 DIAGNOSIS — I341 Nonrheumatic mitral (valve) prolapse: Secondary | ICD-10-CM | POA: Insufficient documentation

## 2016-07-10 DIAGNOSIS — I351 Nonrheumatic aortic (valve) insufficiency: Secondary | ICD-10-CM | POA: Insufficient documentation

## 2016-07-10 DIAGNOSIS — I517 Cardiomegaly: Secondary | ICD-10-CM | POA: Diagnosis not present

## 2016-07-10 DIAGNOSIS — Z01818 Encounter for other preprocedural examination: Secondary | ICD-10-CM

## 2016-07-10 DIAGNOSIS — I340 Nonrheumatic mitral (valve) insufficiency: Secondary | ICD-10-CM

## 2016-07-10 DIAGNOSIS — I059 Rheumatic mitral valve disease, unspecified: Secondary | ICD-10-CM | POA: Diagnosis present

## 2016-07-10 DIAGNOSIS — I7409 Other arterial embolism and thrombosis of abdominal aorta: Secondary | ICD-10-CM

## 2016-07-10 HISTORY — DX: Nonrheumatic mitral (valve) prolapse: I34.1

## 2016-07-10 MED ORDER — AMIODARONE HCL 200 MG PO TABS: 200.0000 mg | ORAL_TABLET | Freq: Every day | ORAL | 0 refills | Status: DC

## 2016-07-10 NOTE — Progress Notes (Signed)
  Echocardiogram 2D Echocardiogram has been performed.  Laura Galvan 07/10/2016, 12:22 PM

## 2016-07-10 NOTE — Patient Instructions (Signed)
Continue all previous medications without any changes at this time  Begin taking amiodarone 7 days prior to your surgery

## 2016-07-10 NOTE — Progress Notes (Signed)
[address]       [place] [address]             [place] REPORT  Referring Provider is Larey Dresser, MD PCP is Saunders Glance Carlean Jews, MD  Chief Complaint  Patient presents with  . Mitral Valve Prolapse    Surgical eval for mitral valve repair, ECHO today, Cardiac Cath pending future date    HPI:  Patient is a 70 year old female with history of mitral valve prolapse and mitral regurgitation, hyperlipidemia, breast cancer status post left mastectomy with reconstruction, and heterozygous status prothrombin gene mutation without any clinical history of significant bleeding diathesis or hypercoagulable state who has been referred for surgical consultation to discuss treatment options for management of severe mitral regurgitation. The patient states that she was noted to have a murmur on routine physical exam many years ago. She has been followed by Dr. Saunders Glance in [facility] for more than 15 years with known mitral valve prolapse and mitral regurgitation. Recent follow-up echocardiogram revealed significant progression of disease with what appeared to be partially flail segment of the posterior leaflet and severe mitral regurgitation. Transesophageal echocardiogram was performed, confirming the presence of severe mitral regurgitation with flail segment of the posterior leaflet. There is normal left ventricular size and systolic function. No other significant abnormalities were identified.  The patient was referred for surgical consultation.  The patient is married and lives with her husband in [place], [place].  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 The patient has been very active physically all of her life and she continues to exercise on a regular basis, including regular swimming, aerobics, and other exercises. She reports no significant change in her exercise  tolerance. She specifically denies any symptoms of exertional chest pain or shortness of breath. She denies any history of PND, orthopnea, or lower extremity edema. She reports occasional palpitations. She has not had any dizzy spells or syncope. She does have history of chronic dry nonproductive cough that in the past has been attributed to bronchiectasis. The patient also has noted that her cough is improved with treatment for chronic allergies.  In the past the patient underwent genetic screening for possible hypercoagulable state because of family member had suffered deep vein thrombosis. She was found to be heterozygous for prothrombin gene mutation. Prior to breast cancer surgery she underwent an extensive hematologic workup that revealed entirely normal blood work with exception of slightly abnormal platelet function. She was treated with DDAVP prior to her mastectomy and suffered a seizure secondary to severe hyponatremia. Prior to breast reconstruction surgery she was not pretreated and she did not have any bleeding or clotting problems at all.  Past Medical History:  Diagnosis Date  . Breast cancer ([place])   . Hypercholesteremia   . Mitral valve prolapse   . Severe mitral regurgitation   . 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 APPENDECTOMY  1960  . MASTECTOMY Left 2010  . ROTATOR CUFF REPAIR Right 2011    Family History  Problem Relation Age of Onset  . Heart disease Father   . Lung cancer Father   . Clotting disorder      "whole family" per pt.   . Lung cancer Mother     Social  History   Social History  . Marital status: Married    Spouse name: N/A  . Number of children: N/A  . Years of education: N/A   Occupational History  . Not on file.   Social History Main Topics  . Smoking status: Never Smoker  . Smokeless tobacco: Never Used    . Alcohol use Not on file  . Drug use: Unknown  . Sexual activity: Not on file   Other Topics Concern  . Not on file   Social History Narrative  . No narrative on file    Current Outpatient Prescriptions  Medication Sig Dispense Refill  . atorvastatin (LIPITOR) 10 MG tablet Take 10 mg by mouth daily.    Marland Kitchen azelastine (ASTELIN) 0.1 % nasal spray Place 1 spray into both nostrils daily. Use in each nostril as directed    . Calcium Carbonate-Vitamin D (CALTRATE 600+D PO) Take 1 tablet by mouth daily.    . cetirizine (ZYRTEC) 10 MG chewable tablet Chew 10 mg by mouth daily.    Marland Kitchen Respiratory Therapy Supplies (FLUTTER) DEVI Use as directed 1 each 0  . amiodarone (PACERONE) 200 MG tablet Take 1 tablet (200 mg total) by mouth daily. 30 tablet 0   No current facility-administered medications for this visit.     Allergies  Allergen Reactions  . Aspirin Other (See Comments)  . Desmopressin Other (See Comments)    hyponatraemia  . Sulfa Antibiotics     Childhood allergy      Review of Systems:   General:  normal appetite, normal energy, no weight gain, no weight loss, no fever  Cardiac:  no chest pain with exertion, no chest pain at rest, noSOB with exertion, no resting SOB, no PND, no orthopnea, + palpitations, no arrhythmia, no atrial fibrillation, no LE edema, no dizzy spells, no syncope  Respiratory:  no shortness of breath, no home oxygen, no productive cough, + chronic dry cough, no bronchitis, no wheezing, no hemoptysis, no asthma, no pain with inspiration or cough, no sleep apnea, no CPAP at night  GI:   no difficulty swallowing, no reflux, no frequent heartburn, no hiatal hernia, no abdominal pain, no constipation, no diarrhea, no hematochezia, no hematemesis, no melena  GU:   no dysuria,  no frequency, no urinary tract infection, no hematuria, no enlarged prostate, no kidney stones, no kidney disease  Vascular:  no pain suggestive of claudication, no pain in feet, = leg cramps,  + varicose veins, NO DVT, NO non-healing foot ulcer  Neuro:   NO stroke, NO TIA's, NO seizures, NO headaches, NO temporary blindness one eye,  NO slurred speech, NO peripheral neuropathy, NO chronic pain, NO instability of gait, NO memory/cognitive dysfunction  Musculoskeletal: no arthritis, no joint swelling, no myalgias, no difficulty walking, normal mobility   Skin:   no rash, no itching, no skin infections, no pressure sores or ulcerations  Psych:   no anxiety, no depression, no nervousness, no unusual recent stress  Eyes:   no blurry vision, no floaters, no recent vision changes, = wears glasses or contacts  ENT:   NO hearing loss, NO loose or painful teeth, NO dentures, last saw dentist 6/17  Hematologic:  no easy bruising, no abnormal bleeding, ? clotting disorder, no frequent epistaxis  Endocrine:  no diabetes, does not check CBG's at home     Physical Exam:   BP (!) 155/96 (BP Location: Right Arm, Patient Position: Sitting, Cuff Size: Small)   Pulse 63   Resp 18  Ht 5' 2.5" (1.588 m)   Wt 105 lb (47.6 kg)   SpO2 96% Comment: RA  BMI 18.90 kg/m   General:  Thin,  well-appearing  HEENT:  Unremarkable   Neck:   no JVD, no bruits, no adenopathy   Chest:   clear to auscultation, symmetrical breath sounds, no wheezes, no rhonchi   CV:   RRR, grade IV/VI holosystolic murmur best at apex and LLSB  Abdomen:  soft, non-tender, no masses   Extremities:  warm, well-perfused, pulses palpable, no LE edema  Rectal/GU  Deferred  Neuro:   Grossly non-focal and symmetrical throughout  Skin:   Clean and dry, no rashes, no breakdown   Diagnostic Tests:  TRANSESOPHAGEAL ECHOCARDIOGRAM  Both report and images from transesophageal echocardiogram performed 06/03/2016 at [facility] in [place] have been reviewed. 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 There is severe prolapse involving the posterior leaflet with a partially flail segment of the  posterior leaflet. There is severe mitral regurgitation with an eccentric jet that is directed anteriorly around the left atrium. There is normal left ventricular size and systolic function. Right ventricular size and function are normal. There was trace tricuspid regurgitation.   Transthoracic Echocardiography  Patient:    Terissa, Starr MR #:       [id number] Study Date: 07/10/2016 Gender:     F Age:        26 Height:     158.8 cm Weight:     47.6 kg BSA:        1.44 m^2 Pt. Status: Room:   Elenore Paddy, M.D.  REFERRING    Loralie Champagne, M.D.  PERFORMING   [facility], Outpatient  SONOGRAPHER  [facility]  ATTENDING    Pamalee Leyden E  cc:  ------------------------------------------------------------------- LV EF: 60% -   65%  ------------------------------------------------------------------- Indications:      424.0 Mitral valve disease.  ------------------------------------------------------------------- History:   PMH:  Cough.  ------------------------------------------------------------------- Study Conclusions  - Left ventricle: The cavity size was normal. There was mild focal   basal hypertrophy of the septum. Systolic function was normal.   The estimated ejection fraction was in the range of 60% to 65%.   Wall motion was normal; there were no regional wall motion   abnormalities. Doppler parameters are consistent with abnormal   left ventricular relaxation (grade 1 diastolic dysfunction).   Doppler parameters are consistent with high ventricular filling   pressure. - Aortic valve: Trileaflet; mildly thickened, mildly calcified   leaflets. There was trivial regurgitation. - Mitral valve: Severe prolapse, involving the posterior leaflet.   There was severe regurgitation directed anteriorly. - Left atrium: The atrium was moderately dilated. Volume/bsa, ES   (1-plane Simpson&'s, A4C): 41.4  ml/m^2.  ------------------------------------------------------------------- Study data:  No prior study was available for comparison.  Study status:  Routine.  Procedure:  The patient reported no pain pre or post test. Transthoracic echocardiography. Image quality was poor. The study was technically difficult, as a result of breast implants.  Study completion:  There were no complications. Transthoracic echocardiography.  M-mode, complete 2D, spectral Doppler, and color Doppler.  Birthdate:  Patient birthdate: 05-19-1946.  Age:  Patient is 70 yr old.  Sex:  Gender: female. BMI: 18.9 kg/m^2.  Blood pressure:     138/71  Patient status: Outpatient.  Study date:  Study date: 07/10/2016. Study time: 11:06 AM.  Location:  Echo laboratory.  -------------------------------------------------------------------  ------------------------------------------------------------------- Left ventricle:  The cavity  size was normal. There was mild focal basal hypertrophy of the septum. Systolic function was normal. The estimated ejection fraction was in the range of 60% to 65%. Wall motion was normal; there were no regional wall motion abnormalities. Doppler parameters are consistent with abnormal left ventricular relaxation (grade 1 diastolic dysfunction). Doppler parameters are consistent with high ventricular filling pressure.   ------------------------------------------------------------------- Aortic valve:   Trileaflet; mildly thickened, mildly calcified leaflets. Mobility was not restricted.  Doppler:  Transvalvular velocity was within the normal range. There was no stenosis. There was trivial regurgitation.  ------------------------------------------------------------------- Aorta:  Aortic root: The aortic root was normal in size.  ------------------------------------------------------------------- Mitral valve:   Moderately thickened leaflets . Mobility was not restricted.  Severe  prolapse, involving the posterior leaflet. Doppler:  Transvalvular velocity was within the normal range. There was no evidence for stenosis. There was severe regurgitation directed anteriorly.    Peak gradient (D): 8 mm Hg.  ------------------------------------------------------------------- Left atrium:  The atrium was moderately dilated.  ------------------------------------------------------------------- Right ventricle:  The cavity size was normal. Wall thickness was normal. Systolic function was normal.  ------------------------------------------------------------------- Pulmonic valve:   Poorly visualized.  Structurally normal valve. Cusp separation was normal.  Doppler:  Transvalvular velocity was within the normal range. There was no evidence for stenosis. There was no regurgitation.  ------------------------------------------------------------------- Tricuspid valve:   Structurally normal valve.    Doppler: Transvalvular velocity was within the normal range. There was no regurgitation.  ------------------------------------------------------------------- Pulmonary artery:   The main pulmonary artery was normal-sized. Systolic pressure was within the normal range.  ------------------------------------------------------------------- Right atrium:  The atrium was normal in size.  ------------------------------------------------------------------- Pericardium:  There was no pericardial effusion.  ------------------------------------------------------------------- Systemic veins: Inferior vena cava: The vessel was normal in size.  ------------------------------------------------------------------- Measurements   Left ventricle                        Value        Reference  LV ID, ED, PLAX chordal        (L)    40.5  mm     43 - 52  LV ID, ES, PLAX chordal        (L)    21.6  mm     23 - 38  LV fx shortening, PLAX chordal        47    %      >=29  LV PW  thickness, ED                   10.1  mm     ---------  IVS/LV PW ratio, ED            (H)    1.4          <=1.3  LV e&', lateral                        9.14  cm/s   ---------  LV E/e&', lateral                      15.86        ---------  LV e&', medial                         7.4   cm/s   ---------  LV E/e&', medial  19.59        ---------  LV e&', average                        8.27  cm/s   ---------  LV E/e&', average                      17.53        ---------  Longitudinal strain, TDI              22    %      ---------    Ventricular septum                    Value        Reference  IVS thickness, ED                     14.1  mm     ---------    LVOT                                  Value        Reference  LVOT peak velocity, S                 95    cm/s   ---------  LVOT mean velocity, S                 57.4  cm/s   ---------  LVOT VTI, S                           21.2  cm     ---------    Aorta                                 Value        Reference  Aortic root ID, ED                    30    mm     ---------    Left atrium                           Value        Reference  LA ID, A-P, ES                        38    mm     ---------  LA ID/bsa, A-P                 (H)    2.63  cm/m^2 <=2.2  LA volume, S                          72.3  ml     ---------  LA volume/bsa, S                      50    ml/m^2 ---------  LA volume, ES, 1-p A4C                59.8  ml     ---------  LA volume/bsa, ES, 1-p A4C  41.4  ml/m^2 ---------  LA volume, ES, 1-p A2C                79.6  ml     ---------  LA volume/bsa, ES, 1-p A2C            55.1  ml/m^2 ---------    Mitral valve                          Value        Reference  Mitral E-wave peak velocity           145   cm/s   ---------  Mitral A-wave peak velocity           104   cm/s   ---------  Mitral deceleration time       (H)    239   ms     150 - 230  Mitral peak gradient, D               8     mm Hg   ---------  Mitral E/A ratio, peak                1.4          ---------  Mitral regurg VTI, PISA               148   cm     ---------  Mitral ERO, PISA                      0.47  cm^2   ---------  Mitral regurg volume, PISA            70    ml     ---------    Systemic veins                        Value        Reference  Estimated CVP                         3     mm Hg  ---------    Right ventricle                       Value        Reference  TAPSE                                 28.7  mm     ---------  RV s&', lateral, S                     12.4  cm/s   ---------  Legend: (L)  and  (H)  mark values outside specified reference range.  ------------------------------------------------------------------- Prepared and Electronically Authenticated by  Candee Furbish, M.D. 2017-08-18T14:46:36   Impression:  Patient has stage C severe asymptomatic primary mitral regurgitation. I have personally reviewed the patient's recent transesophageal echocardiogram and the transthoracic echocardiogram performed earlier today. 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 There is an obvious flail segment of the posterior leaflet with ruptured chordae tendineae. There is severe mitral regurgitation with a jet of regurgitation that courses anteriorly around the entire left atrium. There is normal left ventricular size and  systolic function. No other significant abnormalities are noted.  The patient is otherwise healthy and remains active physically. She specifically denies any symptoms of exertional shortness of breath, chest discomfort, or worsening fatigue. Options include continued watchful waiting with close follow-up versus elective mitral valve repair. Based upon review of the patient's echocardiograms I feel there is a greater than 95% likelihood of successful and durable mitral valve repair. Operative risks should be  very low. The patient would likely be a good candidate for minimally invasive approach for surgery, although diagnostic cardiac catheterization has yet to be performed.   Plan:  The patient was counseled at length regarding the indications, risks and potential benefits of mitral valve repair.  The rationale for elective surgery has been explained, including a comparison between surgery and continued medical therapy with close follow-up.  The likelihood of successful and durable valve repair has been discussed with particular reference to the findings of their recent echocardiogram.  Based upon these findings and previous experience, I have quoted them a greater than 95 percent likelihood of successful valve repair.  In the unlikely event that their valve cannot be successfully repaired, we discussed the possibility of replacing the mitral valve using a mechanical prosthesis with the attendant need for long-term anticoagulation versus the alternative of replacing it using a bioprosthetic tissue valve with its potential for late structural valve deterioration and failure, depending upon the patient's longevity.  The patient understands and accepts all potential risks of surgery including but not limited to risk of death, stroke or other neurologic complication, myocardial infarction, congestive heart failure, respiratory failure, renal failure, bleeding requiring transfusion and/or reexploration, arrhythmia, infection or other wound complications, pneumonia, pleural and/or pericardial effusion, pulmonary embolus, aortic dissection or other major vascular complication, or delayed complications related to valve repair or replacement including but not limited to structural valve deterioration and failure, thrombosis, embolization, endocarditis, or paravalvular leak.  Alternative surgical approaches have been discussed including a comparison between conventional sternotomy and minimally-invasive techniques.  The  relative risks and benefits of each have been reviewed as they pertain to the patient's specific circumstances, and all of their questions have been addressed.  Specific risks potentially related to the minimally-invasive approach were discussed at length, including but not limited to risk of conversion to full or partial sternotomy, aortic dissection or other major vascular complication, unilateral acute lung injury or pulmonary edema, phrenic nerve dysfunction or paralysis, rib fracture, chronic pain, lung hernia, or lymphocele. All of her questions have been answered.  The patient hopes to proceed with surgery in the near future. We will make arrangements for her to undergo left and right heart catheterization as soon as practical. She will also undergo CT angiography of the aorta and iliac vessels to evaluate the feasibility of peripheral cannulation for surgery. I have personally discussed results from the patient's previous hematologic workup that was performed at [facility] in 2010 over the telephone with Dr. Beryle Beams.  Under the circumstances no further preoperative hematologic workup is felt to be necessary as long as routine blood counts and coagulation profile remained normal. Pretreatment with DDAVP or any other products is not recommended. I have given the patient a prescription for amiodarone to begin 7 days prior to surgery to decrease her risk of perioperative atrial dysrhythmias. We tentatively plan to proceed with surgery on Wednesday, 07/29/2016. The patient will return for follow-up on Tuesday, 07/28/2016 prior to surgery.    I spent in excess of 90 minutes during the conduct of this  office consultation and >50% of this time involved direct face-to-face encounter with the patient for counseling and/or coordination of their care.   Valentina Gu. Roxy Manns, MD 07/10/2016 3:25 PM

## 2016-07-14 ENCOUNTER — Telehealth: Payer: Self-pay | Admitting: Cardiovascular Disease

## 2016-07-14 NOTE — Telephone Encounter (Signed)
I spoke with the pt and she saw her opthalmologist today and was diagnosed with preseptal cellulitis.  She was started on Augmentin twice a day for 7 days and is scheduled to see opthalmology tomorrow for a recheck.  They advised her to contact our office to determine if she can proceed with cardiac catheterization.  Discussed with Dr Burt Knack and pt okay to proceed.

## 2016-07-14 NOTE — Telephone Encounter (Signed)
New message      Pt calling stating that she has questions about her procedure. Please call.

## 2016-07-16 ENCOUNTER — Encounter (HOSPITAL_COMMUNITY)
Admission: RE | Disposition: A | Discharge status: Home / Self Care | Payer: Self-pay | Source: Ambulatory Visit | Attending: Cardiovascular Disease

## 2016-07-16 ENCOUNTER — Ambulatory Visit (HOSPITAL_COMMUNITY)
Admission: RE | Admit: 2016-07-16 | Discharge: 2016-07-16 | Disposition: A | Discharge status: Home / Self Care | Payer: Medicare Other | Source: Ambulatory Visit | Attending: Cardiovascular Disease | Admitting: Cardiovascular Disease

## 2016-07-16 ENCOUNTER — Other Ambulatory Visit: Payer: Self-pay | Admitting: Cardiovascular Disease

## 2016-07-16 DIAGNOSIS — I2584 Coronary atherosclerosis due to calcified coronary lesion: Secondary | ICD-10-CM | POA: Diagnosis not present

## 2016-07-16 DIAGNOSIS — Z8249 Family history of ischemic heart disease and other diseases of the circulatory system: Secondary | ICD-10-CM | POA: Insufficient documentation

## 2016-07-16 DIAGNOSIS — Z9012 Acquired absence of left breast and nipple: Secondary | ICD-10-CM | POA: Diagnosis not present

## 2016-07-16 DIAGNOSIS — E785 Hyperlipidemia, unspecified: Secondary | ICD-10-CM | POA: Diagnosis not present

## 2016-07-16 DIAGNOSIS — Z882 Allergy status to sulfonamides status: Secondary | ICD-10-CM | POA: Diagnosis not present

## 2016-07-16 DIAGNOSIS — E7800 Pure hypercholesterolemia, unspecified: Secondary | ICD-10-CM | POA: Insufficient documentation

## 2016-07-16 DIAGNOSIS — I2510 Atherosclerotic heart disease of native coronary artery without angina pectoris: Secondary | ICD-10-CM | POA: Insufficient documentation

## 2016-07-16 DIAGNOSIS — I340 Nonrheumatic mitral (valve) insufficiency: Secondary | ICD-10-CM | POA: Diagnosis present

## 2016-07-16 DIAGNOSIS — I058 Other rheumatic mitral valve diseases: Secondary | ICD-10-CM | POA: Diagnosis not present

## 2016-07-16 DIAGNOSIS — Z853 Personal history of malignant neoplasm of breast: Secondary | ICD-10-CM | POA: Diagnosis not present

## 2016-07-16 DIAGNOSIS — D6852 Prothrombin gene mutation: Secondary | ICD-10-CM | POA: Diagnosis not present

## 2016-07-16 DIAGNOSIS — Z801 Family history of malignant neoplasm of trachea, bronchus and lung: Secondary | ICD-10-CM | POA: Insufficient documentation

## 2016-07-16 DIAGNOSIS — D6851 Activated protein C resistance: Secondary | ICD-10-CM | POA: Insufficient documentation

## 2016-07-16 HISTORY — PX: PERIPHERAL VASCULAR CATHETERIZATION: SHX172C

## 2016-07-16 HISTORY — PX: CARDIAC CATHETERIZATION: SHX172

## 2016-07-16 LAB — PROTIME-INR
INR: 0.98
Prothrombin Time: 13 seconds (ref 11.4–15.2)

## 2016-07-16 LAB — CBC
HEMATOCRIT: 39 % (ref 36.0–46.0)
HEMOGLOBIN: 12.9 g/dL (ref 12.0–15.0)
MCH: 30.8 pg (ref 26.0–34.0)
MCHC: 33.1 g/dL (ref 30.0–36.0)
MCV: 93.1 fL (ref 78.0–100.0)
Platelets: 198 10*3/uL (ref 150–400)
RBC: 4.19 MIL/uL (ref 3.87–5.11)
RDW: 13.6 % (ref 11.5–15.5)
WBC: 8.5 10*3/uL (ref 4.0–10.5)

## 2016-07-16 LAB — POCT I-STAT 3, VENOUS BLOOD GAS (G3P V)
ACID-BASE DEFICIT: 1 mmol/L (ref 0.0–2.0)
Acid-base deficit: 1 mmol/L (ref 0.0–2.0)
BICARBONATE: 25.5 meq/L — AB (ref 20.0–24.0)
Bicarbonate: 26 mEq/L — ABNORMAL HIGH (ref 20.0–24.0)
Bicarbonate: 27.3 mEq/L — ABNORMAL HIGH (ref 20.0–24.0)
O2 SAT: 77 %
O2 SAT: 80 %
O2 Saturation: 75 %
PCO2 VEN: 51.3 mmHg — AB (ref 45.0–50.0)
PCO2 VEN: 54.2 mmHg — AB (ref 45.0–50.0)
PH VEN: 7.31 — AB (ref 7.250–7.300)
PO2 VEN: 46 mmHg — AB (ref 31.0–45.0)
TCO2: 27 mmol/L (ref 0–100)
TCO2: 27 mmol/L (ref 0–100)
TCO2: 29 mmol/L (ref 0–100)
pCO2, Ven: 50.5 mmHg — ABNORMAL HIGH (ref 45.0–50.0)
pH, Ven: 7.311 — ABNORMAL HIGH (ref 7.250–7.300)
pH, Ven: 7.313 — ABNORMAL HIGH (ref 7.250–7.300)
pO2, Ven: 44 mmHg (ref 31.0–45.0)
pO2, Ven: 49 mmHg — ABNORMAL HIGH (ref 31.0–45.0)

## 2016-07-16 LAB — BASIC METABOLIC PANEL
Anion gap: 6 (ref 5–15)
BUN: 10 mg/dL (ref 6–20)
CHLORIDE: 102 mmol/L (ref 101–111)
CO2: 29 mmol/L (ref 22–32)
CREATININE: 0.54 mg/dL (ref 0.44–1.00)
Calcium: 9.1 mg/dL (ref 8.9–10.3)
GFR calc non Af Amer: >60 mL/min (ref >60)
Glucose, Bld: 104 mg/dL — ABNORMAL HIGH (ref 65–99)
POTASSIUM: 4.2 mmol/L (ref 3.5–5.1)
SODIUM: 137 mmol/L (ref 135–145)

## 2016-07-16 LAB — POCT I-STAT 3, ART BLOOD GAS (G3+)
Acid-Base Excess: 1 mmol/L (ref 0.0–2.0)
BICARBONATE: 27.3 meq/L — AB (ref 20.0–24.0)
O2 Saturation: 99 %
PCO2 ART: 50.5 mmHg — AB (ref 35.0–45.0)
PH ART: 7.341 — AB (ref 7.350–7.450)
TCO2: 29 mmol/L (ref 0–100)
pO2, Arterial: 125 mmHg — ABNORMAL HIGH (ref 80.0–100.0)

## 2016-07-16 SURGERY — RIGHT/LEFT HEART CATH AND CORONARY ANGIOGRAPHY
Anesthesia: LOCAL

## 2016-07-16 MED ORDER — IOPAMIDOL (ISOVUE-370) INJECTION 76%
INTRAVENOUS | Status: AC
  Filled 2016-07-16: qty 100

## 2016-07-16 MED ORDER — VERAPAMIL HCL 2.5 MG/ML IV SOLN
INTRAVENOUS | Status: AC
  Filled 2016-07-16: qty 2

## 2016-07-16 MED ORDER — FENTANYL CITRATE (PF) 100 MCG/2ML IJ SOLN
INTRAMUSCULAR | Status: DC | PRN
  Administered 2016-07-16 (×2): 25 ug via INTRAVENOUS

## 2016-07-16 MED ORDER — ONDANSETRON HCL 4 MG/2ML IJ SOLN: 4.0000 mg | Freq: Four times a day (QID) | INTRAMUSCULAR | Status: DC | PRN

## 2016-07-16 MED ORDER — ACETAMINOPHEN 325 MG PO TABS: 650.0000 mg | ORAL_TABLET | ORAL | Status: DC | PRN

## 2016-07-16 MED ORDER — SODIUM CHLORIDE 0.9% FLUSH: 3.0000 mL | Freq: Two times a day (BID) | INTRAVENOUS | Status: DC

## 2016-07-16 MED ORDER — HEPARIN (PORCINE) IN NACL 2-0.9 UNIT/ML-% IJ SOLN
INTRAMUSCULAR | Status: AC
  Filled 2016-07-16: qty 1000

## 2016-07-16 MED ORDER — MIDAZOLAM HCL 2 MG/2ML IJ SOLN
INTRAMUSCULAR | Status: DC | PRN
  Administered 2016-07-16 (×2): 1 mg via INTRAVENOUS

## 2016-07-16 MED ORDER — FENTANYL CITRATE (PF) 100 MCG/2ML IJ SOLN
INTRAMUSCULAR | Status: AC
  Filled 2016-07-16: qty 2

## 2016-07-16 MED ORDER — SODIUM CHLORIDE 0.9 % IV SOLN: 250.0000 mL | INTRAVENOUS | Status: DC | PRN

## 2016-07-16 MED ORDER — SODIUM CHLORIDE 0.9 % IV SOLN
INTRAVENOUS | Status: DC
  Administered 2016-07-16: 09:00:00 via INTRAVENOUS

## 2016-07-16 MED ORDER — VERAPAMIL HCL 2.5 MG/ML IV SOLN
INTRAVENOUS | Status: DC | PRN
  Administered 2016-07-16: 10 mL via INTRA_ARTERIAL

## 2016-07-16 MED ORDER — LIDOCAINE HCL (PF) 1 % IJ SOLN
INTRAMUSCULAR | Status: AC
  Filled 2016-07-16: qty 30

## 2016-07-16 MED ORDER — IOPAMIDOL (ISOVUE-370) INJECTION 76%
INTRAVENOUS | Status: DC | PRN
Start: 2016-07-16 — End: 2016-07-16
  Administered 2016-07-16: 50 mL via INTRA_ARTERIAL

## 2016-07-16 MED ORDER — SODIUM CHLORIDE 0.9 % IV SOLN: INTRAVENOUS | Status: AC

## 2016-07-16 MED ORDER — HEPARIN (PORCINE) IN NACL 2-0.9 UNIT/ML-% IJ SOLN
INTRAMUSCULAR | Status: DC | PRN
  Administered 2016-07-16: 1000 mL

## 2016-07-16 MED ORDER — HEPARIN SODIUM (PORCINE) 1000 UNIT/ML IJ SOLN
INTRAMUSCULAR | Status: DC | PRN
  Administered 2016-07-16: 3000 [IU] via INTRAVENOUS

## 2016-07-16 MED ORDER — HEPARIN SODIUM (PORCINE) 1000 UNIT/ML IJ SOLN
INTRAMUSCULAR | Status: AC
  Filled 2016-07-16: qty 1

## 2016-07-16 MED ORDER — SODIUM CHLORIDE 0.9% FLUSH: 3.0000 mL | INTRAVENOUS | Status: DC | PRN

## 2016-07-16 MED ORDER — LIDOCAINE HCL (PF) 1 % IJ SOLN
INTRAMUSCULAR | Status: DC | PRN
  Administered 2016-07-16: 3 mL via INTRADERMAL

## 2016-07-16 MED ORDER — ASPIRIN 81 MG PO CHEW
CHEWABLE_TABLET | ORAL | Status: AC
  Administered 2016-07-16: 81 mg via ORAL
  Filled 2016-07-16: qty 1

## 2016-07-16 MED ORDER — MIDAZOLAM HCL 2 MG/2ML IJ SOLN
INTRAMUSCULAR | Status: AC
  Filled 2016-07-16: qty 2

## 2016-07-16 MED ORDER — ASPIRIN 81 MG PO CHEW
81.0000 mg | CHEWABLE_TABLET | ORAL | Status: AC
  Administered 2016-07-16: 81 mg via ORAL

## 2016-07-16 SURGICAL SUPPLY — 15 items
CATH BALLN WEDGE 5F 110CM (CATHETERS) ×2 IMPLANT
CATH INFINITI 5 FR JL3.5 (CATHETERS) ×2 IMPLANT
CATH INFINITI 5FR ANG PIGTAIL (CATHETERS) ×2 IMPLANT
CATH INFINITI JR4 5F (CATHETERS) ×2 IMPLANT
DEVICE RAD COMP TR BAND LRG (VASCULAR PRODUCTS) ×2 IMPLANT
GLIDESHEATH SLEND SS 6F .021 (SHEATH) ×2 IMPLANT
KIT HEART LEFT (KITS) ×2 IMPLANT
PACK CARDIAC CATHETERIZATION (CUSTOM PROCEDURE TRAY) ×2 IMPLANT
SHEATH FAST CATH BRACH 5F 5CM (SHEATH) ×2 IMPLANT
SYR MEDRAD MARK V 150ML (SYRINGE) ×2 IMPLANT
TRANSDUCER W/STOPCOCK (MISCELLANEOUS) ×2 IMPLANT
TUBING CIL FLEX 10 FLL-RA (TUBING) ×2 IMPLANT
WIRE EMERALD 3MM-J .025X260CM (WIRE) ×2 IMPLANT
WIRE HI TORQ VERSACORE-J 145CM (WIRE) ×2 IMPLANT
WIRE SAFE-T 1.5MM-J .035X260CM (WIRE) ×2 IMPLANT

## 2016-07-16 NOTE — Discharge Instructions (Signed)
Radial Site Care °Refer to this sheet in the next few weeks. These instructions provide you with information about caring for yourself after your procedure. Your health care provider may also give you more specific instructions. Your treatment has been planned according to current medical practices, but problems sometimes occur. Call your health care provider if you have any problems or questions after your procedure. °WHAT TO EXPECT AFTER THE PROCEDURE °After your procedure, it is typical to have the following: °· Bruising at the radial site that usually fades within 1-2 weeks. °· Blood collecting in the tissue (hematoma) that may be painful to the touch. It should usually decrease in size and tenderness within 1-2 weeks. °HOME CARE INSTRUCTIONS °· Take medicines only as directed by your health care provider. °· You may shower 24-48 hours after the procedure or as directed by your health care provider. Remove the bandage (dressing) and gently wash the site with plain soap and water. Pat the area dry with a clean towel. Do not rub the site, because this may cause bleeding. °· Do not take baths, swim, or use a hot tub until your health care provider approves. °· Check your insertion site every day for redness, swelling, or drainage. °· Do not apply powder or lotion to the site. °· Do not flex or bend the affected arm for 24 hours or as directed by your health care provider. °· Do not push or pull heavy objects with the affected arm for 24 hours or as directed by your health care provider. °· Do not lift over 10 lb (4.5 kg) for 5 days after your procedure or as directed by your health care provider. °· Ask your health care provider when it is okay to: °¨ Return to work or school. °¨ Resume usual physical activities or sports. °¨ Resume sexual activity. °· Do not drive home if you are discharged the same day as the procedure. Have someone else drive you. °· You may drive 24 hours after the procedure unless otherwise  instructed by your health care provider. °· Do not operate machinery or power tools for 24 hours after the procedure. °· If your procedure was done as an outpatient procedure, which means that you went home the same day as your procedure, a responsible adult should be with you for the first 24 hours after you arrive home. °· Keep all follow-up visits as directed by your health care provider. This is important. °SEEK MEDICAL CARE IF: °· You have a fever. °· You have chills. °· You have increased bleeding from the radial site. Hold pressure on the site. °SEEK IMMEDIATE MEDICAL CARE IF: °· You have unusual pain at the radial site. °· You have redness, warmth, or swelling at the radial site. °· You have drainage (other than a small amount of blood on the dressing) from the radial site. °· The radial site is bleeding, and the bleeding does not stop after 30 minutes of holding steady pressure on the site. °· Your arm or hand becomes pale, cool, tingly, or numb. °  °This information is not intended to replace advice given to you by your health care provider. Make sure you discuss any questions you have with your health care provider. °  °Document Released: 12/12/2010 Document Revised: 11/30/2014 Document Reviewed: 05/28/2014 °[employer] Interactive Patient Education ©2016 [employer] ° °

## 2016-07-16 NOTE — Interval H&P Note (Signed)
History and Physical Interval Note:  07/16/2016 10:38 AM  Terressa Koyanagi  has presented today for surgery, with the diagnosis of mitrial regurgetation, pre-surgical clearance  The various methods of treatment have been discussed with the patient and family. After consideration of risks, benefits and other options for treatment, the patient has consented to  Procedure(s): Right/Left Heart Cath and Coronary Angiography (N/A) as a surgical intervention .  The patient's history has been reviewed, patient examined, no change in status, stable for surgery.  I have reviewed the patient's chart and labs.  Questions were answered to the patient's satisfaction.     Sherren Mocha

## 2016-07-16 NOTE — H&P (View-Only) (Signed)
[address]       [place] [address]             [place] REPORT  Referring Provider is Larey Dresser, MD PCP is Saunders Glance Carlean Jews, MD  Chief Complaint  Patient presents with  . Mitral Valve Prolapse    Surgical eval for mitral valve repair, ECHO today, Cardiac Cath pending future date    HPI:  Patient is a 70 year old female with history of mitral valve prolapse and mitral regurgitation, hyperlipidemia, breast cancer status post left mastectomy with reconstruction, and heterozygous status prothrombin gene mutation without any clinical history of significant bleeding diathesis or hypercoagulable state who has been referred for surgical consultation to discuss treatment options for management of severe mitral regurgitation. The patient states that she was noted to have a murmur on routine physical exam many years ago. She has been followed by Dr. Saunders Glance in [facility] for more than 15 years with known mitral valve prolapse and mitral regurgitation. Recent follow-up echocardiogram revealed significant progression of disease with what appeared to be partially flail segment of the posterior leaflet and severe mitral regurgitation. Transesophageal echocardiogram was performed, confirming the presence of severe mitral regurgitation with flail segment of the posterior leaflet. There is normal left ventricular size and systolic function. No other significant abnormalities were identified.  The patient was referred for surgical consultation.  The patient is married and lives with her husband in [place], [place].  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 The patient has been very active physically all of her life and she continues to exercise on a regular basis, including regular swimming, aerobics, and other exercises. She reports no significant change in her exercise  tolerance. She specifically denies any symptoms of exertional chest pain or shortness of breath. She denies any history of PND, orthopnea, or lower extremity edema. She reports occasional palpitations. She has not had any dizzy spells or syncope. She does have history of chronic dry nonproductive cough that in the past has been attributed to bronchiectasis. The patient also has noted that her cough is improved with treatment for chronic allergies.  In the past the patient underwent genetic screening for possible hypercoagulable state because of family member had suffered deep vein thrombosis. She was found to be heterozygous for prothrombin gene mutation. Prior to breast cancer surgery she underwent an extensive hematologic workup that revealed entirely normal blood work with exception of slightly abnormal platelet function. She was treated with DDAVP prior to her mastectomy and suffered a seizure secondary to severe hyponatremia. Prior to breast reconstruction surgery she was not pretreated and she did not have any bleeding or clotting problems at all.  Past Medical History:  Diagnosis Date  . Breast cancer ([place])   . Hypercholesteremia   . Mitral valve prolapse   . Severe mitral regurgitation   . 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 APPENDECTOMY  1960  . MASTECTOMY Left 2010  . ROTATOR CUFF REPAIR Right 2011    Family History  Problem Relation Age of Onset  . Heart disease Father   . Lung cancer Father   . Clotting disorder      "whole family" per pt.   . Lung cancer Mother     Social  History   Social History  . Marital status: Married    Spouse name: N/A  . Number of children: N/A  . Years of education: N/A   Occupational History  . Not on file.   Social History Main Topics  . Smoking status: Never Smoker  . Smokeless tobacco: Never Used    . Alcohol use Not on file  . Drug use: Unknown  . Sexual activity: Not on file   Other Topics Concern  . Not on file   Social History Narrative  . No narrative on file    Current Outpatient Prescriptions  Medication Sig Dispense Refill  . atorvastatin (LIPITOR) 10 MG tablet Take 10 mg by mouth daily.    Marland Kitchen azelastine (ASTELIN) 0.1 % nasal spray Place 1 spray into both nostrils daily. Use in each nostril as directed    . Calcium Carbonate-Vitamin D (CALTRATE 600+D PO) Take 1 tablet by mouth daily.    . cetirizine (ZYRTEC) 10 MG chewable tablet Chew 10 mg by mouth daily.    Marland Kitchen Respiratory Therapy Supplies (FLUTTER) DEVI Use as directed 1 each 0  . amiodarone (PACERONE) 200 MG tablet Take 1 tablet (200 mg total) by mouth daily. 30 tablet 0   No current facility-administered medications for this visit.     Allergies  Allergen Reactions  . Aspirin Other (See Comments)  . Desmopressin Other (See Comments)    hyponatraemia  . Sulfa Antibiotics     Childhood allergy      Review of Systems:   General:  normal appetite, normal energy, no weight gain, no weight loss, no fever  Cardiac:  no chest pain with exertion, no chest pain at rest, noSOB with exertion, no resting SOB, no PND, no orthopnea, + palpitations, no arrhythmia, no atrial fibrillation, no LE edema, no dizzy spells, no syncope  Respiratory:  no shortness of breath, no home oxygen, no productive cough, + chronic dry cough, no bronchitis, no wheezing, no hemoptysis, no asthma, no pain with inspiration or cough, no sleep apnea, no CPAP at night  GI:   no difficulty swallowing, no reflux, no frequent heartburn, no hiatal hernia, no abdominal pain, no constipation, no diarrhea, no hematochezia, no hematemesis, no melena  GU:   no dysuria,  no frequency, no urinary tract infection, no hematuria, no enlarged prostate, no kidney stones, no kidney disease  Vascular:  no pain suggestive of claudication, no pain in feet, = leg cramps,  + varicose veins, NO DVT, NO non-healing foot ulcer  Neuro:   NO stroke, NO TIA's, NO seizures, NO headaches, NO temporary blindness one eye,  NO slurred speech, NO peripheral neuropathy, NO chronic pain, NO instability of gait, NO memory/cognitive dysfunction  Musculoskeletal: no arthritis, no joint swelling, no myalgias, no difficulty walking, normal mobility   Skin:   no rash, no itching, no skin infections, no pressure sores or ulcerations  Psych:   no anxiety, no depression, no nervousness, no unusual recent stress  Eyes:   no blurry vision, no floaters, no recent vision changes, = wears glasses or contacts  ENT:   NO hearing loss, NO loose or painful teeth, NO dentures, last saw dentist 6/17  Hematologic:  no easy bruising, no abnormal bleeding, ? clotting disorder, no frequent epistaxis  Endocrine:  no diabetes, does not check CBG's at home     Physical Exam:   BP (!) 155/96 (BP Location: Right Arm, Patient Position: Sitting, Cuff Size: Small)   Pulse 63   Resp 18  Ht 5' 2.5" (1.588 m)   Wt 105 lb (47.6 kg)   SpO2 96% Comment: RA  BMI 18.90 kg/m   General:  Thin,  well-appearing  HEENT:  Unremarkable   Neck:   no JVD, no bruits, no adenopathy   Chest:   clear to auscultation, symmetrical breath sounds, no wheezes, no rhonchi   CV:   RRR, grade IV/VI holosystolic murmur best at apex and LLSB  Abdomen:  soft, non-tender, no masses   Extremities:  warm, well-perfused, pulses palpable, no LE edema  Rectal/GU  Deferred  Neuro:   Grossly non-focal and symmetrical throughout  Skin:   Clean and dry, no rashes, no breakdown   Diagnostic Tests:  TRANSESOPHAGEAL ECHOCARDIOGRAM  Both report and images from transesophageal echocardiogram performed 06/03/2016 at [facility] in [place] have been reviewed. 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 There is severe prolapse involving the posterior leaflet with a partially flail segment of the  posterior leaflet. There is severe mitral regurgitation with an eccentric jet that is directed anteriorly around the left atrium. There is normal left ventricular size and systolic function. Right ventricular size and function are normal. There was trace tricuspid regurgitation.   Transthoracic Echocardiography  Patient:    Terissa, Starr MR #:       [id number] Study Date: 07/10/2016 Gender:     F Age:        26 Height:     158.8 cm Weight:     47.6 kg BSA:        1.44 m^2 Pt. Status: Room:   Elenore Paddy, M.D.  REFERRING    Loralie Champagne, M.D.  PERFORMING   [facility], Outpatient  SONOGRAPHER  [facility]  ATTENDING    Pamalee Leyden E  cc:  ------------------------------------------------------------------- LV EF: 60% -   65%  ------------------------------------------------------------------- Indications:      424.0 Mitral valve disease.  ------------------------------------------------------------------- History:   PMH:  Cough.  ------------------------------------------------------------------- Study Conclusions  - Left ventricle: The cavity size was normal. There was mild focal   basal hypertrophy of the septum. Systolic function was normal.   The estimated ejection fraction was in the range of 60% to 65%.   Wall motion was normal; there were no regional wall motion   abnormalities. Doppler parameters are consistent with abnormal   left ventricular relaxation (grade 1 diastolic dysfunction).   Doppler parameters are consistent with high ventricular filling   pressure. - Aortic valve: Trileaflet; mildly thickened, mildly calcified   leaflets. There was trivial regurgitation. - Mitral valve: Severe prolapse, involving the posterior leaflet.   There was severe regurgitation directed anteriorly. - Left atrium: The atrium was moderately dilated. Volume/bsa, ES   (1-plane Simpson&'s, A4C): 41.4  ml/m^2.  ------------------------------------------------------------------- Study data:  No prior study was available for comparison.  Study status:  Routine.  Procedure:  The patient reported no pain pre or post test. Transthoracic echocardiography. Image quality was poor. The study was technically difficult, as a result of breast implants.  Study completion:  There were no complications. Transthoracic echocardiography.  M-mode, complete 2D, spectral Doppler, and color Doppler.  Birthdate:  Patient birthdate: 05-19-1946.  Age:  Patient is 70 yr old.  Sex:  Gender: female. BMI: 18.9 kg/m^2.  Blood pressure:     138/71  Patient status: Outpatient.  Study date:  Study date: 07/10/2016. Study time: 11:06 AM.  Location:  Echo laboratory.  -------------------------------------------------------------------  ------------------------------------------------------------------- Left ventricle:  The cavity  size was normal. There was mild focal basal hypertrophy of the septum. Systolic function was normal. The estimated ejection fraction was in the range of 60% to 65%. Wall motion was normal; there were no regional wall motion abnormalities. Doppler parameters are consistent with abnormal left ventricular relaxation (grade 1 diastolic dysfunction). Doppler parameters are consistent with high ventricular filling pressure.   ------------------------------------------------------------------- Aortic valve:   Trileaflet; mildly thickened, mildly calcified leaflets. Mobility was not restricted.  Doppler:  Transvalvular velocity was within the normal range. There was no stenosis. There was trivial regurgitation.  ------------------------------------------------------------------- Aorta:  Aortic root: The aortic root was normal in size.  ------------------------------------------------------------------- Mitral valve:   Moderately thickened leaflets . Mobility was not restricted.  Severe  prolapse, involving the posterior leaflet. Doppler:  Transvalvular velocity was within the normal range. There was no evidence for stenosis. There was severe regurgitation directed anteriorly.    Peak gradient (D): 8 mm Hg.  ------------------------------------------------------------------- Left atrium:  The atrium was moderately dilated.  ------------------------------------------------------------------- Right ventricle:  The cavity size was normal. Wall thickness was normal. Systolic function was normal.  ------------------------------------------------------------------- Pulmonic valve:   Poorly visualized.  Structurally normal valve. Cusp separation was normal.  Doppler:  Transvalvular velocity was within the normal range. There was no evidence for stenosis. There was no regurgitation.  ------------------------------------------------------------------- Tricuspid valve:   Structurally normal valve.    Doppler: Transvalvular velocity was within the normal range. There was no regurgitation.  ------------------------------------------------------------------- Pulmonary artery:   The main pulmonary artery was normal-sized. Systolic pressure was within the normal range.  ------------------------------------------------------------------- Right atrium:  The atrium was normal in size.  ------------------------------------------------------------------- Pericardium:  There was no pericardial effusion.  ------------------------------------------------------------------- Systemic veins: Inferior vena cava: The vessel was normal in size.  ------------------------------------------------------------------- Measurements   Left ventricle                        Value        Reference  LV ID, ED, PLAX chordal        (L)    40.5  mm     43 - 52  LV ID, ES, PLAX chordal        (L)    21.6  mm     23 - 38  LV fx shortening, PLAX chordal        47    %      >=29  LV PW  thickness, ED                   10.1  mm     ---------  IVS/LV PW ratio, ED            (H)    1.4          <=1.3  LV e&', lateral                        9.14  cm/s   ---------  LV E/e&', lateral                      15.86        ---------  LV e&', medial                         7.4   cm/s   ---------  LV E/e&', medial  19.59        ---------  LV e&', average                        8.27  cm/s   ---------  LV E/e&', average                      17.53        ---------  Longitudinal strain, TDI              22    %      ---------    Ventricular septum                    Value        Reference  IVS thickness, ED                     14.1  mm     ---------    LVOT                                  Value        Reference  LVOT peak velocity, S                 95    cm/s   ---------  LVOT mean velocity, S                 57.4  cm/s   ---------  LVOT VTI, S                           21.2  cm     ---------    Aorta                                 Value        Reference  Aortic root ID, ED                    30    mm     ---------    Left atrium                           Value        Reference  LA ID, A-P, ES                        38    mm     ---------  LA ID/bsa, A-P                 (H)    2.63  cm/m^2 <=2.2  LA volume, S                          72.3  ml     ---------  LA volume/bsa, S                      50    ml/m^2 ---------  LA volume, ES, 1-p A4C                59.8  ml     ---------  LA volume/bsa, ES, 1-p A4C  41.4  ml/m^2 ---------  LA volume, ES, 1-p A2C                79.6  ml     ---------  LA volume/bsa, ES, 1-p A2C            55.1  ml/m^2 ---------    Mitral valve                          Value        Reference  Mitral E-wave peak velocity           145   cm/s   ---------  Mitral A-wave peak velocity           104   cm/s   ---------  Mitral deceleration time       (H)    239   ms     150 - 230  Mitral peak gradient, D               8     mm Hg   ---------  Mitral E/A ratio, peak                1.4          ---------  Mitral regurg VTI, PISA               148   cm     ---------  Mitral ERO, PISA                      0.47  cm^2   ---------  Mitral regurg volume, PISA            70    ml     ---------    Systemic veins                        Value        Reference  Estimated CVP                         3     mm Hg  ---------    Right ventricle                       Value        Reference  TAPSE                                 28.7  mm     ---------  RV s&', lateral, S                     12.4  cm/s   ---------  Legend: (L)  and  (H)  mark values outside specified reference range.  ------------------------------------------------------------------- Prepared and Electronically Authenticated by  Candee Furbish, M.D. 2017-08-18T14:46:36   Impression:  Patient has stage C severe asymptomatic primary mitral regurgitation. I have personally reviewed the patient's recent transesophageal echocardiogram and the transthoracic echocardiogram performed earlier today. 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 There is an obvious flail segment of the posterior leaflet with ruptured chordae tendineae. There is severe mitral regurgitation with a jet of regurgitation that courses anteriorly around the entire left atrium. There is normal left ventricular size and  systolic function. No other significant abnormalities are noted.  The patient is otherwise healthy and remains active physically. She specifically denies any symptoms of exertional shortness of breath, chest discomfort, or worsening fatigue. Options include continued watchful waiting with close follow-up versus elective mitral valve repair. Based upon review of the patient's echocardiograms I feel there is a greater than 95% likelihood of successful and durable mitral valve repair. Operative risks should be  very low. The patient would likely be a good candidate for minimally invasive approach for surgery, although diagnostic cardiac catheterization has yet to be performed.   Plan:  The patient was counseled at length regarding the indications, risks and potential benefits of mitral valve repair.  The rationale for elective surgery has been explained, including a comparison between surgery and continued medical therapy with close follow-up.  The likelihood of successful and durable valve repair has been discussed with particular reference to the findings of their recent echocardiogram.  Based upon these findings and previous experience, I have quoted them a greater than 95 percent likelihood of successful valve repair.  In the unlikely event that their valve cannot be successfully repaired, we discussed the possibility of replacing the mitral valve using a mechanical prosthesis with the attendant need for long-term anticoagulation versus the alternative of replacing it using a bioprosthetic tissue valve with its potential for late structural valve deterioration and failure, depending upon the patient's longevity.  The patient understands and accepts all potential risks of surgery including but not limited to risk of death, stroke or other neurologic complication, myocardial infarction, congestive heart failure, respiratory failure, renal failure, bleeding requiring transfusion and/or reexploration, arrhythmia, infection or other wound complications, pneumonia, pleural and/or pericardial effusion, pulmonary embolus, aortic dissection or other major vascular complication, or delayed complications related to valve repair or replacement including but not limited to structural valve deterioration and failure, thrombosis, embolization, endocarditis, or paravalvular leak.  Alternative surgical approaches have been discussed including a comparison between conventional sternotomy and minimally-invasive techniques.  The  relative risks and benefits of each have been reviewed as they pertain to the patient's specific circumstances, and all of their questions have been addressed.  Specific risks potentially related to the minimally-invasive approach were discussed at length, including but not limited to risk of conversion to full or partial sternotomy, aortic dissection or other major vascular complication, unilateral acute lung injury or pulmonary edema, phrenic nerve dysfunction or paralysis, rib fracture, chronic pain, lung hernia, or lymphocele. All of her questions have been answered.  The patient hopes to proceed with surgery in the near future. We will make arrangements for her to undergo left and right heart catheterization as soon as practical. She will also undergo CT angiography of the aorta and iliac vessels to evaluate the feasibility of peripheral cannulation for surgery. I have personally discussed results from the patient's previous hematologic workup that was performed at [facility] in 2010 over the telephone with Dr. Beryle Beams.  Under the circumstances no further preoperative hematologic workup is felt to be necessary as long as routine blood counts and coagulation profile remained normal. Pretreatment with DDAVP or any other products is not recommended. I have given the patient a prescription for amiodarone to begin 7 days prior to surgery to decrease her risk of perioperative atrial dysrhythmias. We tentatively plan to proceed with surgery on Wednesday, 07/29/2016. The patient will return for follow-up on Tuesday, 07/28/2016 prior to surgery.    I spent in excess of 90 minutes during the conduct of this  office consultation and >50% of this time involved direct face-to-face encounter with the patient for counseling and/or coordination of their care.   Valentina Gu. Roxy Manns, MD 07/10/2016 3:25 PM

## 2016-07-17 ENCOUNTER — Encounter (HOSPITAL_COMMUNITY): Payer: Self-pay | Admitting: Cardiovascular Disease

## 2016-07-23 NOTE — Pre-Procedure Instructions (Signed)
    Kaidance Joaquim  07/23/2016      [id number] [address], [address] [place] [address] Phone: [phone] Fax: [phone]    Your procedure is scheduled on Wed., Sept 6  Report to [facility] Admitting at 6:30 A.M.  Call this number if you have problems the morning of surgery:  [phone]   Remember:  Do not eat food or drink liquids after midnight on Tues. Sept.5  Take these medicines the morning of surgery with A SIP OF WATER :  nasal spray              Stop vitamins/herbal medicines and NSAIDS: advil, mortin, ibuprofin,  aleve, BC Powders, Goody's.   Do not wear jewelry, make-up or nail polish.  Do not wear lotions, powders, or perfumes, or deoderant.  Do not shave 48 hours prior to surgery.  Men may shave face and neck.  Do not bring valuables to the hospital.  [facility] is not responsible for any belongings or valuables.  Contacts, dentures or bridgework may not be worn into surgery.  Leave your suitcase in the car.  After surgery it may be brought to your room.  For patients admitted to the hospital, discharge time will be determined by your treatment team.  Patients discharged the day of surgery will not be allowed to drive home.    Special instructions: review preparing for surgery  Please read over the following fact sheets that you were given. Coughing and Deep Breathing and MRSA Information

## 2016-07-24 ENCOUNTER — Ambulatory Visit
Admission: RE | Admit: 2016-07-24 | Discharge: 2016-07-24 | Disposition: A | Discharge status: Home / Self Care | Payer: Medicare Other | Source: Ambulatory Visit | Attending: Thoracic Surgery (Cardiothoracic Vascular Surgery) | Admitting: Thoracic Surgery (Cardiothoracic Vascular Surgery)

## 2016-07-24 ENCOUNTER — Other Ambulatory Visit: Payer: Medicare Other

## 2016-07-24 ENCOUNTER — Encounter (HOSPITAL_COMMUNITY)
Admission: RE | Admit: 2016-07-24 | Discharge: 2016-07-24 | Disposition: A | Discharge status: Home / Self Care | Payer: Medicare Other | Source: Ambulatory Visit | Attending: Thoracic Surgery (Cardiothoracic Vascular Surgery) | Admitting: Thoracic Surgery (Cardiothoracic Vascular Surgery)

## 2016-07-24 ENCOUNTER — Ambulatory Visit (HOSPITAL_BASED_OUTPATIENT_CLINIC_OR_DEPARTMENT_OTHER)
Admission: RE | Admit: 2016-07-24 | Discharge: 2016-07-24 | Disposition: A | Discharge status: Home / Self Care | Payer: Medicare Other | Source: Ambulatory Visit | Attending: Thoracic Surgery (Cardiothoracic Vascular Surgery) | Admitting: Thoracic Surgery (Cardiothoracic Vascular Surgery)

## 2016-07-24 ENCOUNTER — Ambulatory Visit (HOSPITAL_COMMUNITY)
Admission: RE | Admit: 2016-07-24 | Discharge: 2016-07-24 | Disposition: A | Discharge status: Home / Self Care | Payer: Medicare Other | Source: Ambulatory Visit | Attending: Thoracic Surgery (Cardiothoracic Vascular Surgery) | Admitting: Thoracic Surgery (Cardiothoracic Vascular Surgery)

## 2016-07-24 ENCOUNTER — Encounter (HOSPITAL_COMMUNITY): Payer: Self-pay

## 2016-07-24 DIAGNOSIS — Z01812 Encounter for preprocedural laboratory examination: Secondary | ICD-10-CM | POA: Diagnosis not present

## 2016-07-24 DIAGNOSIS — Z882 Allergy status to sulfonamides status: Secondary | ICD-10-CM | POA: Insufficient documentation

## 2016-07-24 DIAGNOSIS — I340 Nonrheumatic mitral (valve) insufficiency: Secondary | ICD-10-CM

## 2016-07-24 DIAGNOSIS — I6523 Occlusion and stenosis of bilateral carotid arteries: Secondary | ICD-10-CM | POA: Insufficient documentation

## 2016-07-24 DIAGNOSIS — Z0183 Encounter for blood typing: Secondary | ICD-10-CM | POA: Insufficient documentation

## 2016-07-24 DIAGNOSIS — D6851 Activated protein C resistance: Secondary | ICD-10-CM | POA: Insufficient documentation

## 2016-07-24 DIAGNOSIS — I7409 Other arterial embolism and thrombosis of abdominal aorta: Secondary | ICD-10-CM

## 2016-07-24 DIAGNOSIS — Z01818 Encounter for other preprocedural examination: Secondary | ICD-10-CM

## 2016-07-24 DIAGNOSIS — Z79899 Other long term (current) drug therapy: Secondary | ICD-10-CM | POA: Insufficient documentation

## 2016-07-24 DIAGNOSIS — R918 Other nonspecific abnormal finding of lung field: Secondary | ICD-10-CM | POA: Diagnosis not present

## 2016-07-24 DIAGNOSIS — Z853 Personal history of malignant neoplasm of breast: Secondary | ICD-10-CM | POA: Insufficient documentation

## 2016-07-24 DIAGNOSIS — E7800 Pure hypercholesterolemia, unspecified: Secondary | ICD-10-CM | POA: Diagnosis not present

## 2016-07-24 DIAGNOSIS — I341 Nonrheumatic mitral (valve) prolapse: Secondary | ICD-10-CM

## 2016-07-24 DIAGNOSIS — Z888 Allergy status to other drugs, medicaments and biological substances status: Secondary | ICD-10-CM | POA: Diagnosis not present

## 2016-07-24 HISTORY — DX: Cardiac murmur, unspecified: R01.1

## 2016-07-24 HISTORY — DX: Pneumonia, unspecified organism: J18.9

## 2016-07-24 LAB — PULMONARY FUNCTION TEST
DL/VA % pred: 103 %
DL/VA: 4.85 ml/min/mmHg/L
DLCO UNC % PRED: 71 %
DLCO UNC: 16.44 ml/min/mmHg
FEF 25-75 POST: 2.15 L/s
FEF 25-75 Pre: 1.49 L/sec
FEF2575-%CHANGE-POST: 43 %
FEF2575-%PRED-POST: 118 %
FEF2575-%Pred-Pre: 82 %
FEV1-%CHANGE-POST: 10 %
FEV1-%PRED-POST: 82 %
FEV1-%Pred-Pre: 74 %
FEV1-POST: 1.78 L
FEV1-Pre: 1.61 L
FEV1FVC-%CHANGE-POST: 7 %
FEV1FVC-%Pred-Pre: 101 %
FEV6-%Change-Post: 3 %
FEV6-%PRED-PRE: 76 %
FEV6-%Pred-Post: 79 %
FEV6-PRE: 2.07 L
FEV6-Post: 2.15 L
FEV6FVC-%PRED-PRE: 105 %
FEV6FVC-%Pred-Post: 105 %
FVC-%CHANGE-POST: 3 %
FVC-%PRED-POST: 75 %
FVC-%PRED-PRE: 73 %
FVC-PRE: 2.09 L
FVC-Post: 2.15 L
POST FEV6/FVC RATIO: 100 %
PRE FEV1/FVC RATIO: 77 %
PRE FEV6/FVC RATIO: 100 %
Post FEV1/FVC ratio: 83 %
RV % PRED: 109 %
RV: 2.36 L
TLC % pred: 91 %
TLC: 4.47 L

## 2016-07-24 LAB — VAS US DOPPLER PRE CABG
LEFT ECA DIAS: -4 cm/s
LEFT VERTEBRAL DIAS: -15 cm/s
LICAPDIAS: -19 cm/s
Left CCA dist dias: 15 cm/s
Left CCA dist sys: 61 cm/s
Left CCA prox dias: 14 cm/s
Left CCA prox sys: 68 cm/s
Left ICA dist dias: -22 cm/s
Left ICA dist sys: -89 cm/s
Left ICA prox sys: -69 cm/s
RCCAPDIAS: 16 cm/s
RIGHT ECA DIAS: -8 cm/s
RIGHT VERTEBRAL DIAS: -7 cm/s
Right CCA prox sys: 62 cm/s
Right cca dist sys: -94 cm/s

## 2016-07-24 LAB — CBC
HEMATOCRIT: 38.9 % (ref 36.0–46.0)
Hemoglobin: 12.7 g/dL (ref 12.0–15.0)
MCH: 30.5 pg (ref 26.0–34.0)
MCHC: 32.6 g/dL (ref 30.0–36.0)
MCV: 93.5 fL (ref 78.0–100.0)
PLATELETS: 181 10*3/uL (ref 150–400)
RBC: 4.16 MIL/uL (ref 3.87–5.11)
RDW: 13.5 % (ref 11.5–15.5)
WBC: 7.3 10*3/uL (ref 4.0–10.5)

## 2016-07-24 LAB — URINALYSIS, ROUTINE W REFLEX MICROSCOPIC
BILIRUBIN URINE: NEGATIVE
GLUCOSE, UA: NEGATIVE mg/dL
HGB URINE DIPSTICK: NEGATIVE
Ketones, ur: NEGATIVE mg/dL
Leukocytes, UA: NEGATIVE
Nitrite: NEGATIVE
PROTEIN: NEGATIVE mg/dL
SPECIFIC GRAVITY, URINE: 1.023 (ref 1.005–1.030)
pH: 7.5 (ref 5.0–8.0)

## 2016-07-24 LAB — COMPREHENSIVE METABOLIC PANEL
ALT: 22 U/L (ref 14–54)
AST: 25 U/L (ref 15–41)
Albumin: 4.2 g/dL (ref 3.5–5.0)
Alkaline Phosphatase: 51 U/L (ref 38–126)
Anion gap: 9 (ref 5–15)
BILIRUBIN TOTAL: 0.8 mg/dL (ref 0.3–1.2)
BUN: 9 mg/dL (ref 6–20)
CHLORIDE: 102 mmol/L (ref 101–111)
CO2: 24 mmol/L (ref 22–32)
Calcium: 9.3 mg/dL (ref 8.9–10.3)
Creatinine, Ser: 0.49 mg/dL (ref 0.44–1.00)
Glucose, Bld: 100 mg/dL — ABNORMAL HIGH (ref 65–99)
POTASSIUM: 4.2 mmol/L (ref 3.5–5.1)
Sodium: 135 mmol/L (ref 135–145)
TOTAL PROTEIN: 6.9 g/dL (ref 6.5–8.1)

## 2016-07-24 LAB — BLOOD GAS, ARTERIAL
ACID-BASE EXCESS: 2.4 mmol/L — AB (ref 0.0–2.0)
BICARBONATE: 26.9 mmol/L (ref 20.0–28.0)
FIO2: 21
O2 SAT: 93.2 %
PATIENT TEMPERATURE: 98.6
PO2 ART: 68.7 mmHg — AB (ref 83.0–108.0)
pCO2 arterial: 45.6 mmHg (ref 32.0–48.0)
pH, Arterial: 7.389 (ref 7.350–7.450)

## 2016-07-24 LAB — SURGICAL PCR SCREEN
MRSA, PCR: NEGATIVE
STAPHYLOCOCCUS AUREUS: NEGATIVE

## 2016-07-24 LAB — PROTIME-INR
INR: 1.02
Prothrombin Time: 13.4 seconds (ref 11.4–15.2)

## 2016-07-24 LAB — ABO/RH: ABO/RH(D): A POS

## 2016-07-24 LAB — APTT: aPTT: 33 seconds (ref 24–36)

## 2016-07-24 MED ORDER — IOPAMIDOL (ISOVUE-370) INJECTION 76%
75.0000 mL | Freq: Once | INTRAVENOUS | Status: AC | PRN
  Administered 2016-07-24: 75 mL via INTRAVENOUS

## 2016-07-24 MED ORDER — ALBUTEROL SULFATE (2.5 MG/3ML) 0.083% IN NEBU
2.5000 mg | INHALATION_SOLUTION | Freq: Once | RESPIRATORY_TRACT | Status: AC
  Administered 2016-07-24: 2.5 mg via RESPIRATORY_TRACT

## 2016-07-24 NOTE — Progress Notes (Signed)
PCP:Dr. Charleston Poot @ [facility] in [place]  Dr. Sherian Maroon winslow @ [facility] [place], [place] [phone]  Cardiologist:     Dr. Benjamine Mola henke @ [facility] in [facility]  Dr. Ronalee Belts cooper

## 2016-07-24 NOTE — Progress Notes (Signed)
Pre-op Cardiac Surgery  Carotid Findings:  Bilateral: No significant (1-39%) ICA stenosis. Antegrade vertebral flow.    Upper Extremity Right Left  Brachial Pressures 154 N/A mastectomy  Radial Waveforms Tri Tri  Ulnar Waveforms Bi Tri  Palmar Arch (Allen's Test) Obliterates with radial compression, normal with ulnar compression Obliterates with radial compression, decreases >50% with ulnar compression     Oliver Hum, RVT 07/24/2016 Entered by Landry Mellow

## 2016-07-25 LAB — HEMOGLOBIN A1C
HEMOGLOBIN A1C: 5.3 % (ref 4.8–5.6)
MEAN PLASMA GLUCOSE: 105 mg/dL

## 2016-07-28 ENCOUNTER — Ambulatory Visit (INDEPENDENT_AMBULATORY_CARE_PROVIDER_SITE_OTHER): Payer: Medicare Other | Admitting: Thoracic Surgery (Cardiothoracic Vascular Surgery)

## 2016-07-28 ENCOUNTER — Encounter: Payer: Self-pay | Admitting: Thoracic Surgery (Cardiothoracic Vascular Surgery)

## 2016-07-28 VITALS — BP 130/76 | HR 76 | Resp 18 | Ht 62.5 in | Wt 103.0 lb

## 2016-07-28 DIAGNOSIS — I340 Nonrheumatic mitral (valve) insufficiency: Secondary | ICD-10-CM | POA: Diagnosis not present

## 2016-07-28 DIAGNOSIS — I341 Nonrheumatic mitral (valve) prolapse: Secondary | ICD-10-CM | POA: Diagnosis not present

## 2016-07-28 MED ORDER — SODIUM CHLORIDE 0.9 % IV SOLN
INTRAVENOUS | Status: DC
  Filled 2016-07-28: qty 30

## 2016-07-28 MED ORDER — EPINEPHRINE HCL 1 MG/ML IJ SOLN
0.0000 ug/min | INTRAVENOUS | Status: DC
  Filled 2016-07-28: qty 4

## 2016-07-28 MED ORDER — PLASMA-LYTE 148 IV SOLN
INTRAVENOUS | Status: DC
  Filled 2016-07-28: qty 2.5

## 2016-07-28 MED ORDER — MAGNESIUM SULFATE 50 % IJ SOLN
40.0000 meq | INTRAMUSCULAR | Status: DC
  Filled 2016-07-28: qty 10

## 2016-07-28 MED ORDER — METOPROLOL TARTRATE 12.5 MG HALF TABLET: 12.5000 mg | ORAL_TABLET | Freq: Once | ORAL | Status: DC

## 2016-07-28 MED ORDER — POTASSIUM CHLORIDE 2 MEQ/ML IV SOLN
80.0000 meq | INTRAVENOUS | Status: DC
  Filled 2016-07-28: qty 40

## 2016-07-28 MED ORDER — NITROGLYCERIN IN D5W 200-5 MCG/ML-% IV SOLN
2.0000 ug/min | INTRAVENOUS | Status: DC
  Filled 2016-07-28: qty 250

## 2016-07-28 MED ORDER — DOPAMINE-DEXTROSE 3.2-5 MG/ML-% IV SOLN
0.0000 ug/kg/min | INTRAVENOUS | Status: DC
  Filled 2016-07-28: qty 250

## 2016-07-28 MED ORDER — VANCOMYCIN HCL 1000 MG IV SOLR
INTRAVENOUS | Status: AC
  Administered 2016-07-29: 1000 mL
  Filled 2016-07-28: qty 1000

## 2016-07-28 MED ORDER — GLUTARALDEHYDE 0.625% SOAKING SOLUTION
TOPICAL | Status: DC | PRN
  Filled 2016-07-28: qty 50

## 2016-07-28 MED ORDER — VANCOMYCIN HCL IN DEXTROSE 1-5 GM/200ML-% IV SOLN
1000.0000 mg | INTRAVENOUS | Status: AC
  Administered 2016-07-29: 1000 mg via INTRAVENOUS
  Filled 2016-07-28: qty 200

## 2016-07-28 MED ORDER — AMINOCAPROIC ACID 250 MG/ML IV SOLN
INTRAVENOUS | Status: AC
  Administered 2016-07-29: 70 mL/h via INTRAVENOUS
  Filled 2016-07-28: qty 40

## 2016-07-28 MED ORDER — SODIUM CHLORIDE 0.9 % IV SOLN
INTRAVENOUS | Status: AC
  Administered 2016-07-29: 1.4 [IU]/h via INTRAVENOUS
  Filled 2016-07-28: qty 2.5

## 2016-07-28 MED ORDER — DEXMEDETOMIDINE HCL IN NACL 400 MCG/100ML IV SOLN
0.1000 ug/kg/h | INTRAVENOUS | Status: AC
  Administered 2016-07-29: .2 ug/kg/h via INTRAVENOUS
  Filled 2016-07-28: qty 100

## 2016-07-28 MED ORDER — CHLORHEXIDINE GLUCONATE 0.12 % MT SOLN: 15.0000 mL | Freq: Once | OROMUCOSAL | Status: DC

## 2016-07-28 MED ORDER — CEFUROXIME SODIUM 1.5 G IJ SOLR
1.5000 g | INTRAMUSCULAR | Status: AC
  Administered 2016-07-29: .75 g via INTRAVENOUS
  Administered 2016-07-29: 1.5 g via INTRAVENOUS
  Filled 2016-07-28 (×2): qty 1.5

## 2016-07-28 MED ORDER — PHENYLEPHRINE HCL 10 MG/ML IJ SOLN
30.0000 ug/min | INTRAVENOUS | Status: DC
  Administered 2016-07-29: 25 ug/min via INTRAVENOUS
  Filled 2016-07-28: qty 2

## 2016-07-28 MED ORDER — DEXTROSE 5 % IV SOLN
750.0000 mg | INTRAVENOUS | Status: DC
  Filled 2016-07-28: qty 750

## 2016-07-28 NOTE — H&P (Signed)
[address]       [place] [address]             [phone]          CARDIOTHORACIC SURGERY HISTORY AND PHYSICAL EXAM  Referring Provider is Larey Dresser, MD PCP is Saunders Glance Carlean Jews, MD      Chief Complaint  Patient presents with  . Mitral Valve Prolapse    Surgical eval for mitral valve repair, ECHO today, Cardiac Cath pending future date    HPI:  Patient is a 70 year old female with history of mitral valve prolapse and mitral regurgitation, hyperlipidemia, breast cancer status post left mastectomy with reconstruction, and heterozygous status prothrombin gene mutation without any clinical history of significant bleeding diathesis or hypercoagulable state who has been referred for surgical consultation to discuss treatment options for management of severe mitral regurgitation. The patient states that she was noted to have a murmur on routine physical exam many years ago. She has been followed by Dr. Saunders Glance in [facility] for more than 15 years with known mitral valve prolapse and mitral regurgitation. Recent follow-up echocardiogram revealed significant progression of disease with what appeared to be partially flail segment of the posterior leaflet and severe mitral regurgitation. Transesophageal echocardiogram was performed, confirming the presence of severe mitral regurgitation with flail segment of the posterior leaflet. There is normal left ventricular size and systolic function. No other significant abnormalities were identified.  The patient was referred for surgical consultation.  The patient is married and lives with her husband in [place], [place].  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 The patient has been very active physically all of her life and she continues to exercise on a regular basis, including regular swimming, aerobics, and other exercises. She reports no significant  change in her exercise tolerance. She specifically denies any symptoms of exertional chest pain or shortness of breath. She denies any history of PND, orthopnea, or lower extremity edema. She reports occasional palpitations. She has not had any dizzy spells or syncope. She does have history of chronic dry nonproductive cough that in the past has been attributed to bronchiectasis. The patient also has noted that her cough is improved with treatment for chronic allergies.  In the past the patient underwent genetic screening for possible hypercoagulable state because of family member had suffered deep vein thrombosis. She was found to be heterozygous for prothrombin gene mutation. Prior to breast cancer surgery she underwent an extensive hematologic workup that revealed entirely normal blood work with exception of slightly abnormal platelet function. She was treated with DDAVP prior to her mastectomy and suffered a seizure secondary to severe hyponatremia. Prior to breast reconstruction surgery she was not pretreated and she did not have any bleeding or clotting problems at all.  Patient returns to the office today for follow-up of mitral valve prolapse with severe mitral regurgitation. She was originally seen in consultation on 07/10/2016 at which time we tentatively make plans to proceed with surgery on 07/29/2016. Since then the patient has undergone diagnostic cardiac catheterization and CT angiography. She returns to our office for follow-up with plans to proceed with surgery tomorrow morning. She reports no new problems or complaints over the past few weeks.    Past Medical History:  Diagnosis Date  . Breast cancer ([place])   . Heart murmur   . Hypercholesteremia   . Mitral valve prolapse   . Pneumonia  history of 2014  . Severe mitral regurgitation   . 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 Thrombophilia  associated with double heterozygosity for prothrombin gene mutation and factor V Leiden mutation [facility])     Past Surgical History:  Procedure Laterality Date  . APPENDECTOMY  1960  . CARDIAC CATHETERIZATION N/A 07/16/2016   Procedure: Right/Left Heart Cath and Coronary Angiography;  Surgeon: Sherren Mocha, MD;  Location: [facility];  Service: Cardiovascular;  Laterality: N/A;  . MASTECTOMY Left 2010  . PERIPHERAL VASCULAR CATHETERIZATION N/A 07/16/2016   Procedure: Abdominal Aortogram;  Surgeon: Sherren Mocha, MD;  Location: [facility];  Service: Cardiovascular;  Laterality: N/A;  . ROTATOR CUFF REPAIR Right 2011    Family History  Problem Relation Age of Onset  . Heart disease Father   . Lung cancer Father   . Clotting disorder      "whole family" per pt.   . Lung cancer Mother     Social History Social History  Substance Use Topics  . Smoking status: Never Smoker  . Smokeless tobacco: Never Used  . Alcohol use 0.0 oz/week     Comment: moderately    Prior to Admission medications   Medication Sig Start Date End Date Taking? Authorizing Provider  amiodarone (PACERONE) 200 MG tablet Take 1 tablet (200 mg total) by mouth daily. 07/10/16  Yes Rexene Alberts, MD  atorvastatin (LIPITOR) 10 MG tablet Take 10 mg by mouth daily at 6 PM.    Yes Historical Provider, MD  Calcium Carbonate-Vitamin D (CALTRATE 600+D PO) Take 1 tablet by mouth daily.   Yes Historical Provider, MD  cetirizine (ZYRTEC) 10 MG chewable tablet Chew 10 mg by mouth at bedtime.    Yes Historical Provider, MD  Multiple Vitamin (MULTIVITAMIN WITH MINERALS) TABS tablet Take 1 tablet by mouth daily.   Yes Historical Provider, MD    Allergies  Allergen Reactions  . Desmopressin Other (See Comments)    HYPONATREMIA  . Sulfa Antibiotics Other (See Comments)    UNSPECIFIED REACTION FROM CHILDHOOD     Review of Systems:                        General:                                           normal appetite, normal energy, no weight gain, no weight loss, no fever                       Cardiac:                                           no chest pain with exertion, no chest pain at rest, noSOB with exertion, no resting SOB, no PND, no orthopnea, + palpitations, no arrhythmia, no atrial fibrillation, no LE edema, no dizzy spells, no syncope                       Respiratory:  no shortness of breath, no home oxygen, no productive cough, + chronic dry cough, no bronchitis, no wheezing, no hemoptysis, no asthma, no pain with inspiration or cough, no sleep apnea, no CPAP at night                       GI:                                                             no difficulty swallowing, no reflux, no frequent heartburn, no hiatal hernia, no abdominal pain, no constipation, no diarrhea, no hematochezia, no hematemesis, no melena                       GU:                                                            no dysuria,  no frequency, no urinary tract infection, no hematuria, no enlarged prostate, no kidney stones, no kidney disease                       Vascular:                                         no pain suggestive of claudication, no pain in feet, = leg cramps, + varicose veins, NO DVT, NO non-healing foot ulcer                       Neuro:                                                       NO stroke, NO TIA's, NO seizures, NO headaches, NO temporary blindness one eye,  NO slurred speech, NO peripheral neuropathy, NO chronic pain, NO instability of gait, NO memory/cognitive dysfunction                       Musculoskeletal:                   no arthritis, no joint swelling, no myalgias, no difficulty walking, normal mobility                        Skin:                                                          no rash, no itching, no skin infections, no pressure sores or ulcerations  Psych:                                                        no anxiety, no depression, no nervousness, no unusual recent stress                       Eyes:                                                         no blurry vision, no floaters, no recent vision changes, = wears glasses or contacts                       ENT:                                                          NO hearing loss, NO loose or painful teeth, NO dentures, last saw dentist 6/17                       Hematologic:                                   no easy bruising, no abnormal bleeding, ? clotting disorder, no frequent epistaxis                       Endocrine:                                       no diabetes, does not check CBG's at home                                               Physical Exam:                        BP (!) 155/96 (BP Location: Right Arm, Patient Position: Sitting, Cuff Size: Small)   Pulse 63   Resp 18   Ht 5' 2.5" (1.588 m)   Wt 105 lb (47.6 kg)   SpO2 96% Comment: RA  BMI 18.90 kg/m                        General:                                          Thin,  well-appearing                       HEENT:  Unremarkable                        Neck:                                                         no JVD, no bruits, no adenopathy                        Chest:                                                        clear to auscultation, symmetrical breath sounds, no wheezes, no rhonchi                        CV:                                                            RRR, grade IV/VI holosystolic murmur best at apex and LLSB                       Abdomen:                                        soft, non-tender, no masses                        Extremities:                                     warm, well-perfused, pulses palpable, no LE edema                       Rectal/GU                                       Deferred                       Neuro:                                                        Grossly non-focal and symmetrical throughout                       Skin:  Clean and dry, no rashes, no breakdown   Diagnostic Tests:  TRANSESOPHAGEAL ECHOCARDIOGRAM  Both report and images from transesophageal echocardiogram performed 06/03/2016 at [facility] in [place] have been reviewed. 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 There is severe prolapse involving the posterior leaflet with a partially flail segment of the posterior leaflet. There is severe mitral regurgitation with an eccentric jet that is directed anteriorly around the left atrium. There is normal left ventricular size and systolic function. Right ventricular size and function are normal. There was trace tricuspid regurgitation.   Transthoracic Echocardiography  Patient: Jossalin, Knechtel MR #: [id number] Study Date: 07/10/2016 Gender: F Age: 74 Height: 158.8 cm Weight: 47.6 kg BSA: 1.44 m^2 Pt. Status: Room:  Elenore Paddy, M.D. REFERRING Loralie Champagne, M.D. PERFORMING [facility], Outpatient SONOGRAPHER [facility] ATTENDING Pamalee Leyden E  cc:  ------------------------------------------------------------------- LV EF: 60% - 65%  ------------------------------------------------------------------- Indications: 424.0 Mitral valve disease.  ------------------------------------------------------------------- History: PMH: Cough.  ------------------------------------------------------------------- Study Conclusions  - Left ventricle: The cavity size was normal. There was mild focal basal hypertrophy of the septum. Systolic function was normal. The estimated ejection fraction was in the range of 60% to 65%. Wall motion was normal; there were no regional wall motion abnormalities. Doppler parameters are consistent with  abnormal left ventricular relaxation (grade 1 diastolic dysfunction). Doppler parameters are consistent with high ventricular filling pressure. - Aortic valve: Trileaflet; mildly thickened, mildly calcified leaflets. There was trivial regurgitation. - Mitral valve: Severe prolapse, involving the posterior leaflet. There was severe regurgitation directed anteriorly. - Left atrium: The atrium was moderately dilated. Volume/bsa, ES (1-plane Simpson&'s, A4C): 41.4 ml/m^2.  ------------------------------------------------------------------- Study data: No prior study was available for comparison. Study status: Routine. Procedure: The patient reported no pain pre or post test. Transthoracic echocardiography. Image quality was poor. The study was technically difficult, as a result of breast implants. Study completion: There were no complications. Transthoracic echocardiography. M-mode, complete 2D, spectral Doppler, and color Doppler. Birthdate: Patient birthdate: 08-30-1946. Age: Patient is 70 yr old. Sex: Gender: female. BMI: 18.9 kg/m^2. Blood pressure: 138/71 Patient status: Outpatient. Study date: Study date: 07/10/2016. Study time: 11:06 AM. Location: Echo laboratory.  -------------------------------------------------------------------  ------------------------------------------------------------------- Left ventricle: The cavity size was normal. There was mild focal basal hypertrophy of the septum. Systolic function was normal. The estimated ejection fraction was in the range of 60% to 65%. Wall motion was normal; there were no regional wall motion abnormalities. Doppler parameters are consistent with abnormal left ventricular relaxation (grade 1 diastolic dysfunction). Doppler parameters are consistent with high ventricular filling pressure.  ------------------------------------------------------------------- Aortic valve: Trileaflet;  mildly thickened, mildly calcified leaflets. Mobility was not restricted. Doppler: Transvalvular velocity was within the normal range. There was no stenosis. There was trivial regurgitation.  ------------------------------------------------------------------- Aorta: Aortic root: The aortic root was normal in size.  ------------------------------------------------------------------- Mitral valve: Moderately thickened leaflets . Mobility was not restricted. Severe prolapse, involving the posterior leaflet. Doppler: Transvalvular velocity was within the normal range. There was no evidence for stenosis. There was severe regurgitation directed anteriorly. Peak gradient (D): 8 mm Hg.  ------------------------------------------------------------------- Left atrium: The atrium was moderately dilated.  ------------------------------------------------------------------- Right ventricle: The cavity size was normal. Wall thickness was normal. Systolic function was normal.  ------------------------------------------------------------------- Pulmonic valve: Poorly visualized. Structurally normal valve. Cusp separation was normal. Doppler: Transvalvular velocity was within the normal range. There was no evidence for stenosis. There was no regurgitation.  ------------------------------------------------------------------- Tricuspid valve: Structurally normal valve. Doppler: Transvalvular velocity was within the  normal range. There was no regurgitation.  ------------------------------------------------------------------- Pulmonary artery: The main pulmonary artery was normal-sized. Systolic pressure was within the normal range.  ------------------------------------------------------------------- Right atrium: The atrium was normal in size.  ------------------------------------------------------------------- Pericardium: There was no pericardial  effusion.  ------------------------------------------------------------------- Systemic veins: Inferior vena cava: The vessel was normal in size.  ------------------------------------------------------------------- Measurements  Left ventricle Value Reference LV ID, ED, PLAX chordal (L) 40.5 mm 43 - 52 LV ID, ES, PLAX chordal (L) 21.6 mm 23 - 38 LV fx shortening, PLAX chordal 47 % >=29 LV PW thickness, ED 10.1 mm --------- IVS/LV PW ratio, ED (H) 1.4 <=1.3 LV e&', lateral 9.14 cm/s --------- LV E/e&', lateral 15.86 --------- LV e&', medial 7.4 cm/s --------- LV E/e&', medial 19.59 --------- LV e&', average 8.27 cm/s --------- LV E/e&', average 17.53 --------- Longitudinal strain, TDI 22 % ---------  Ventricular septum Value Reference IVS thickness, ED 14.1 mm ---------  LVOT Value Reference LVOT peak velocity, S 95 cm/s --------- LVOT mean velocity, S 57.4 cm/s --------- LVOT VTI, S 21.2 cm ---------  Aorta Value Reference Aortic root ID, ED 30 mm ---------  Left atrium Value Reference LA ID, A-P, ES 38 mm --------- LA ID/bsa, A-P (H) 2.63 cm/m^2 <=2.2 LA volume, S 72.3 ml --------- LA volume/bsa, S 50 ml/m^2 --------- LA volume, ES, 1-p A4C 59.8 ml --------- LA  volume/bsa, ES, 1-p A4C 41.4 ml/m^2 --------- LA volume, ES, 1-p A2C 79.6 ml --------- LA volume/bsa, ES, 1-p A2C 55.1 ml/m^2 ---------  Mitral valve Value Reference Mitral E-wave peak velocity 145 cm/s --------- Mitral A-wave peak velocity 104 cm/s --------- Mitral deceleration time (H) 239 ms 150 - 230 Mitral peak gradient, D 8 mm Hg --------- Mitral E/A ratio, peak 1.4 --------- Mitral regurg VTI, PISA 148 cm --------- Mitral ERO, PISA 0.47 cm^2 --------- Mitral regurg volume, PISA 70 ml ---------  Systemic veins Value Reference Estimated CVP 3 mm Hg ---------  Right ventricle Value Reference TAPSE 28.7 mm --------- RV s&', lateral, S 12.4 cm/s ---------  Legend: (L) and (H) mark values outside specified reference range.  ------------------------------------------------------------------- Prepared and Electronically Authenticated by  Candee Furbish, M.D. 2017-08-18T14:46:36    Procedures   Abdominal Aortogram  Right/Left Heart Cath and Coronary Angiography  Conclusion     Prox LAD to Mid LAD lesion, 30 %stenosed.  LV end diastolic pressure is low.  LV end diastolic pressure is normal.  1. Mild diffuse nonobstructive proximal LAD stenosis 2. Widely patent left main, left circumflex, and RCA 3. Normal intracardiac hemodynamics 4. Patent aortoiliac vessels and patent renal arteries bilaterally  Continue evaluation for minimally invasive mitral valve surgery. Medical therapy for nonobstructive CAD.   Indications   Severe mitral regurgitation [I34.0 (ICD-10-CM)]   Procedural Details/Technique   Technical Details INDICATION: Severe mitral regurgitation, preoperative study  PROCEDURAL DETAILS: There was an indwelling IV in a right antecubital vein. Using normal sterile technique, the IV was changed out for a 5 Fr brachial sheath over a 0.018 inch wire. The right wrist was then prepped, draped, and anesthetized with 1% lidocaine. Using the modified Seldinger technique a 5/6 French Slender sheath was placed in the right radial artery. Intra-arterial verapamil was administered through the radial artery sheath. IV heparin was administered after a JR4 catheter was advanced into the central aorta. A Swan-Ganz catheter was used for the right heart catheterization. Standard protocol was followed for recording of right heart pressures and sampling of oxygen saturations. Fick cardiac output was calculated. Standard Judkins catheters were used for  selective coronary angiography, recording of LV pressure, and abdominal aortography. There were no immediate procedural complications. The patient was transferred to the post catheterization recovery area for further monitoring.  During this procedure the patient is administered a total of Versed 2 mg and Fentanyl 50 mg to achieve and maintain moderate conscious sedation. The patient's heart rate, blood pressure, and oxygen saturation are monitored continuously during the procedure. The period of conscious sedation is 40 minutes, of which I was present face-to-face 100% of this time.   Estimated blood loss <50 mL. .    Coronary Findings   Dominance: Right  Left Anterior Descending  Prox LAD to Mid LAD lesion, 30% stenosed. The lesion is moderately calcified. The stenosis was measured by a visual reading. mild diffuse irregular nonobstructive proximal LAD stenosis  Left Circumflex  Vessel was injected. Vessel is normal in caliber. Vessel is angiographically normal.  Right Coronary Artery  The vessel exhibits minimal luminal  irregularities. The RCA is calcified without stenosis. This is a dominant vessel.  Right Posterior Descending Artery  Vessel is normal in size. Vessel is angiographically normal. The PDA is widely patent  Right Heart   Right Heart Pressures LV EDP is normal. Normal right heart hemodynamics with preserved cardiac output and no evidence of pulmonary hypertension    Left Heart   Left Ventricle LV end diastolic pressure is low.    Aorta Abdominal Aorta: The abdominal aorta is normal in size.    Coronary Diagrams   Diagnostic Diagram     Implants        No implant documentation for this case.  PACS Images   Show images for Cardiac catheterization   Link to Procedure Log   Procedure Log    Hemo Data   Flowsheet Row Most Recent Value  Fick Cardiac Output 5.82 L/min  Fick Cardiac Output Index 3.99 (L/min)/BSA  RA A Wave 6 mmHg  RA V Wave 4 mmHg  RA Mean 2 mmHg  RV Systolic Pressure 26 mmHg  RV Diastolic Pressure 0 mmHg  RV EDP 6 mmHg  PA Systolic Pressure 27 mmHg  PA Diastolic Pressure 5 mmHg  PA Mean 15 mmHg  PW A Wave 17 mmHg  PW V Wave 16 mmHg  PW Mean 10 mmHg  AO Systolic Pressure 96 mmHg  AO Diastolic Pressure 48 mmHg  AO Mean 68 mmHg  LV Systolic Pressure 96 mmHg  LV Diastolic Pressure 3 mmHg  LV EDP 8 mmHg  Arterial Occlusion Pressure Extended Systolic Pressure 82 mmHg  Arterial Occlusion Pressure Extended Diastolic Pressure 47 mmHg  Arterial Occlusion Pressure Extended Mean Pressure 60 mmHg  Left Ventricular Apex Extended Systolic Pressure 123XX123 mmHg  Left Ventricular Apex Extended Diastolic Pressure 3 mmHg  Left Ventricular Apex Extended EDP Pressure 9 mmHg  QP/QS 0.83  TPVR Index 4.55 HRUI  TSVR Index 17.05 HRUI  PVR SVR Ratio 0.09  TPVR/TSVR Ratio 0.27    CT ANGIOGRAPHY CHEST, ABDOMEN, PELVIS WITH CONTRAST  TECHNIQUE: Multidetector CT imaging of the chest, abdomen, pelvis was performed using the standard protocol during bolus  administration of intravenous contrast. Multiplanar CT image reconstructions and MIPs were obtained to evaluate the vascular anatomy.  CONTRAST: 75 mL Isovue 370 IV  COMPARISON: CT chest 09/20/2015  FINDINGS: CHEST  Vascular: Right arm IV contrast administration. SVC patent. Satisfactory opacification of pulmonary arteries noted, and there is no evidence of pulmonary emboli. Patent bilateral pulmonary veins drain into the left atrium. Mild left atrial enlargement. No left atrial  appendage thrombus. Scattered coronary calcifications. Adequate contrast opacification of the thoracic aorta with no evidence of dissection, aneurysm, or stenosis. There is classic 3-vessel brachiocephalic arch anatomy without proximal stenosis. Minimal plaque at the origin of the left subclavian artery without stenosis. Otherwise no significant atheromatous irregularity.  Mediastinum/Lymph Nodes: No pericardial effusion. No masses or pathologically enlarged lymph nodes identified.  Lungs/Pleura: Linear scarring or subsegmental atelectasis in the inferior aspect of the lingula and right middle lobe, slightly increased since previous exam. Lungs otherwise clear. No effusion. No pneumothorax.  Musculoskeletal: No chest wall mass or suspicious bone lesions identified.  ABDOMEN  Arterial  Aorta: No aneurysm, dissection, or stenosis. Mild partially calcified plaque in the infrarenal segment.  Celiac axis: Separate origins of the common hepatic artery and splenic artery, both widely patent. Left gastric arises from the splenic artery. 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  Superior mesenteric: Patent, classic distal branch anatomy.  Left renal: Single, patent, with mild nonocclusive calcified ostial plaque.  Right renal: Single, patent  Inferior mesenteric: Patent  Left iliac: Minimal  calcified plaque at the origin of the common iliac. No aneurysm or stenosis. No significant tortuosity.  Right iliac: Scattered calcified nonocclusive plaque in the common iliac. No aneurysm or stenosis. No significant tortuosity.  Venous  Dedicated venous phase imaging not obtained. Patent bilateral renal veins, splenic vein, and portal vein noted.  Review of the MIP images confirms the above findings.  Nonvasular  Hepatobiliary: No masses or other significant abnormality.  Pancreas: No mass, inflammatory changes, or other significant abnormality.  Spleen: Within normal limits in size and appearance.  Adrenals/Urinary Tract: No masses identified. No evidence of hydronephrosis. Mild wall thickening of the urinary bladder which is physiologically distended.  Stomach/Bowel: No evidence of obstruction, inflammatory process, or abnormal fluid collections.  Lymphatic: No pathologically enlarged lymph nodes.  Reproductive: Coarse calcification in the anterior uterine body probably degenerated fibroid. Otherwise unremarkable.  Other: No ascites. No free air.  Musculoskeletal: Early degenerative spurring in bilateral hips. Negative for fracture.  IMPRESSION: 1. Mild aortoiliac plaque without aneurysm, stenosis, or significant tortuosity. 2. Coronary calcifications 3. Some minimal increase in patchy infiltrate or subsegmental atelectasis in the inferior lingula and right middle lobe.   Electronically Signed By: Lucrezia Europe M.D. 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 I have personally reviewed the patient's recent transesophageal echocardiogram, transthoracic echocardiogram, cardiac catheterization and CT angiograms. 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 There is an obvious flail segment of  the posterior leaflet with ruptured chordae tendineae. There is severe mitral regurgitation with a jet of regurgitation that courses anteriorly around the entire left atrium. There is normal left ventricular size and systolic function. No other significant abnormalities are noted.  Diagnostic cardiac catheterization reveals no significant coronary artery disease and normal right-sided pressures. CT angiography reveals no significant findings including no contraindications to peripheral arterial cannulation for surgery.  The patient is otherwise healthy and remains active physically. She specifically denies any symptoms of exertional shortness of breath, chest discomfort, or worsening fatigue. Options include continued watchful waiting with close follow-up versus elective mitral valve repair. Based upon review of the patient's echocardiograms I feel there is a greater than 95% likelihood of successful and durable mitral valve repair. Operative risks should be very low. The patient appears to be  a good candidate for minimally invasive approach for surgery.    Plan:  The patient and her husband were again counseled at length regarding the indications, risks and potential benefits of mitral valve repair.  The rationale for elective surgery has been explained, including a comparison between surgery and continued medical therapy with close follow-up.  Expectations for the patient's postoperative recovery at been discussed at length.   The patient understands and accepts all potential risks of surgery including but not limited to risk of death, stroke or other neurologic complication, myocardial infarction, congestive heart failure, respiratory failure, renal failure, bleeding requiring transfusion and/or reexploration, arrhythmia, infection or other wound complications, pneumonia, pleural and/or pericardial effusion, pulmonary embolus, aortic dissection or other major vascular complication, or delayed  complications related to valve repair or replacement including but not limited to structural valve deterioration and failure, thrombosis, embolization, endocarditis, or paravalvular leak.  Alternative surgical approaches have been discussed including a comparison between conventional sternotomy and minimally-invasive techniques.  The relative risks and benefits of each have been reviewed as they pertain to the patient's specific circumstances, and all of their questions have been addressed.  Specific risks potentially related to the minimally-invasive approach were discussed at length, including but not limited to risk of conversion to full or partial sternotomy, aortic dissection or other major vascular complication, unilateral acute lung injury or pulmonary edema, phrenic nerve dysfunction or paralysis, rib fracture, chronic pain, lung hernia, or lymphocele.  All of their questions have been answered.    Valentina Gu. Roxy Manns, MD 07/28/2016 5:05 PM

## 2016-07-28 NOTE — Anesthesia Preprocedure Evaluation (Addendum)
Anesthesia Evaluation  Patient identified by MRN, date of birth, ID band Patient awake    Reviewed: Allergy & Precautions, NPO status , Patient's Chart, lab work & pertinent test results  Airway Mallampati: I  TM Distance: >3 FB Neck ROM: Full    Dental  (+) Teeth Intact, Dental Advisory Given   Pulmonary    breath sounds clear to auscultation       Cardiovascular negative cardio ROS   Rhythm:Regular Rate:Bradycardia     Neuro/Psych negative neurological ROS  negative psych ROS   GI/Hepatic negative GI ROS, Neg liver ROS,   Endo/Other  negative endocrine ROS  Renal/GU negative Renal ROS  negative genitourinary   Musculoskeletal negative musculoskeletal ROS (+)   Abdominal Normal abdominal exam  (+)   Peds negative pediatric ROS (+)  Hematology negative hematology ROS (+)   Anesthesia Other Findings   Reproductive/Obstetrics negative OB ROS                            Lab Results  Component Value Date   WBC 7.3 07/24/2016   HGB 12.7 07/24/2016   HCT 38.9 07/24/2016   MCV 93.5 07/24/2016   PLT 181 07/24/2016   Lab Results  Component Value Date   CREATININE 0.49 07/24/2016   BUN 9 07/24/2016   NA 135 07/24/2016   K 4.2 07/24/2016   CL 102 07/24/2016   CO2 24 07/24/2016   Lab Results  Component Value Date   INR 1.02 07/24/2016   INR 0.98 07/16/2016   06/2016 EKG: NSR  06/2016 Echo - Left ventricle: The cavity size was normal. There was mild focal   basal hypertrophy of the septum. Systolic function was normal.   The estimated ejection fraction was in the range of 60% to 65%.   Wall motion was normal; there were no regional wall motion   abnormalities. Doppler parameters are consistent with abnormal   left ventricular relaxation (grade 1 diastolic dysfunction).   Doppler parameters are consistent with high ventricular filling   pressure. - Aortic valve: Trileaflet; mildly  thickened, mildly calcified   leaflets. There was trivial regurgitation. - Mitral valve: Severe prolapse, involving the posterior leaflet.   There was severe regurgitation directed anteriorly. - Left atrium: The atrium was moderately dilated. Volume/bsa, ES   (1-plane Simpson&'s, A4C): 41.4 ml/m^2.  Anesthesia Physical Anesthesia Plan  ASA: IV  Anesthesia Plan: General   Post-op Pain Management:    Induction: Intravenous  Airway Management Planned: Double Lumen EBT  Additional Equipment: Arterial line, CVP, PA Cath, TEE, 3D TEE and Ultrasound Guidance Line Placement  Intra-op Plan:   Post-operative Plan: Post-operative intubation/ventilation  Informed Consent: I have reviewed the patients History and Physical, chart, labs and discussed the procedure including the risks, benefits and alternatives for the proposed anesthesia with the patient or authorized representative who has indicated his/her understanding and acceptance.   Dental advisory given  Plan Discussed with: CRNA  Anesthesia Plan Comments:         Anesthesia Quick Evaluation

## 2016-07-28 NOTE — Progress Notes (Signed)
[address]       [place] [address]             [phone]     CARDIOTHORACIC SURGERY OFFICE NOTE  Referring Provider is Larey Dresser, MD PCP is Orpah Cobb, MD  HPI:  Patient returns to the office today for follow-up of mitral valve prolapse with severe mitral regurgitation. She was originally seen in consultation on 07/10/2016 at which time we tentatively make plans to proceed with surgery on 07/29/2016. Since then the patient has undergone diagnostic cardiac catheterization and CT angiography. She returns to our office for follow-up with plans to proceed with surgery tomorrow morning. She reports no new problems or complaints over the past few weeks.   Current Outpatient Prescriptions  Medication Sig Dispense Refill  . amiodarone (PACERONE) 200 MG tablet Take 1 tablet (200 mg total) by mouth daily. 30 tablet 0  . atorvastatin (LIPITOR) 10 MG tablet Take 10 mg by mouth daily at 6 PM.     . Calcium Carbonate-Vitamin D (CALTRATE 600+D PO) Take 1 tablet by mouth daily.    . cetirizine (ZYRTEC) 10 MG chewable tablet Chew 10 mg by mouth at bedtime.     . Multiple Vitamin (MULTIVITAMIN WITH MINERALS) TABS tablet Take 1 tablet by mouth daily.     No current facility-administered medications for this visit.    Facility-Administered Medications Ordered in Other Visits  Medication Dose Route Frequency Provider Last Rate Last Dose  . [START ON 07/29/2016] aminocaproic acid (AMICAR) 10 g in sodium chloride 0.9 % 100 mL infusion   Intravenous To OR Rexene Alberts, MD      . Derrill Memo ON 07/29/2016] cefUROXime (ZINACEF) 1.5 g in dextrose 5 % 50 mL IVPB  1.5 g Intravenous To OR Rexene Alberts, MD      . Derrill Memo ON 07/29/2016] cefUROXime (ZINACEF) 750 mg in dextrose 5 % 50 mL IVPB  750 mg Intravenous To OR Rexene Alberts, MD      . Derrill Memo ON 07/29/2016] chlorhexidine (PERIDEX) 0.12 % solution 15 mL  15 mL Mouth/Throat Once Rexene Alberts, MD      . Derrill Memo ON  07/29/2016] dexmedetomidine (PRECEDEX) 400 MCG/100ML (4 mcg/mL) infusion  0.1-0.7 mcg/kg/hr Intravenous To OR Rexene Alberts, MD      . Derrill Memo ON 07/29/2016] DOPamine (INTROPIN) 800 mg in dextrose 5 % 250 mL (3.2 mg/mL) infusion  0-10 mcg/kg/min Intravenous To OR Rexene Alberts, MD      . Derrill Memo ON 07/29/2016] EPINEPHrine (ADRENALIN) 4 mg in dextrose 5 % 250 mL (0.016 mg/mL) infusion  0-10 mcg/min Intravenous To OR Rexene Alberts, MD      . glutaraldehyde 0.625% soaking solution   Topical PRN Rexene Alberts, MD      . Derrill Memo ON 07/29/2016] heparin 2,500 Units, papaverine 30 mg in electrolyte-148 (PLASMALYTE-148) 500 mL irrigation   Irrigation To OR Rexene Alberts, MD      . Derrill Memo ON 07/29/2016] heparin 30,000 units/NS 1000 mL solution for CELLSAVER   Other To OR Rexene Alberts, MD      . Derrill Memo ON 07/29/2016] insulin regular (NOVOLIN R,HUMULIN R) 250 Units in sodium chloride 0.9 % 250 mL (1 Units/mL) infusion   Intravenous To OR Rexene Alberts, MD      . Derrill Memo ON 07/29/2016] magnesium sulfate (IV Push/IM) injection 40 mEq  40 mEq Other To OR Rexene Alberts, MD      . [  START ON 07/29/2016] metoprolol tartrate (LOPRESSOR) tablet 12.5 mg  12.5 mg Oral Once Rexene Alberts, MD      . Derrill Memo ON 07/29/2016] nitroGLYCERIN 50 mg in dextrose 5 % 250 mL (0.2 mg/mL) infusion  2-200 mcg/min Intravenous To OR Rexene Alberts, MD      . Derrill Memo ON 07/29/2016] phenylephrine (NEO-SYNEPHRINE) 20 mg in dextrose 5 % 250 mL (0.08 mg/mL) infusion  30-200 mcg/min Intravenous To OR Rexene Alberts, MD      . Derrill Memo ON 07/29/2016] potassium chloride injection 80 mEq  80 mEq Other To OR Rexene Alberts, MD      . Derrill Memo ON 07/29/2016] vancomycin (VANCOCIN) 1,000 mg in sodium chloride 0.9 % 1,000 mL irrigation   Irrigation To OR Rexene Alberts, MD      . Derrill Memo ON 07/29/2016] vancomycin (VANCOCIN) IVPB 1000 mg/200 mL premix  1,000 mg Intravenous To OR Rexene Alberts, MD          Physical Exam:   BP 130/76 (BP Location: Right Arm, Patient  Position: Sitting, Cuff Size: Small)   Pulse 76   Resp 18   Ht 5' 2.5" (1.588 m)   Wt 103 lb (46.7 kg)   SpO2 98% Comment: RA  BMI 18.54 kg/m   General:  Well-appearing  Chest:   Clear  CV:   Regular rate and rhythm with prominent holosystolic murmur  Incisions:  n/a  Abdomen:  Soft and nontender  Extremities:  Warm and well-perfused  Diagnostic Tests:  Procedures   Abdominal Aortogram  Right/Left Heart Cath and Coronary Angiography  Conclusion     Prox LAD to Mid LAD lesion, 30 %stenosed.  LV end diastolic pressure is low.  LV end diastolic pressure is normal.   1. Mild diffuse nonobstructive proximal LAD stenosis 2. Widely patent left main, left circumflex, and RCA 3. Normal intracardiac hemodynamics 4. Patent aortoiliac vessels and patent renal arteries bilaterally  Continue evaluation for minimally invasive mitral valve surgery. Medical therapy for nonobstructive CAD.   Indications   Severe mitral regurgitation [I34.0 (ICD-10-CM)]  Procedural Details/Technique   Technical Details INDICATION: Severe mitral regurgitation, preoperative study  PROCEDURAL DETAILS: There was an indwelling IV in a right antecubital vein. Using normal sterile technique, the IV was changed out for a 5 Fr brachial sheath over a 0.018 inch wire. The right wrist was then prepped, draped, and anesthetized with 1% lidocaine. Using the modified Seldinger technique a 5/6 French Slender sheath was placed in the right radial artery. Intra-arterial verapamil was administered through the radial artery sheath. IV heparin was administered after a JR4 catheter was advanced into the central aorta. A Swan-Ganz catheter was used for the right heart catheterization. Standard protocol was followed for recording of right heart pressures and sampling of oxygen saturations. Fick cardiac output was calculated. Standard Judkins catheters were used for selective coronary angiography, recording of LV pressure, and  abdominal aortography. There were no immediate procedural complications. The patient was transferred to the post catheterization recovery area for further monitoring.  During this procedure the patient is administered a total of Versed 2 mg and Fentanyl 50 mg to achieve and maintain moderate conscious sedation. The patient's heart rate, blood pressure, and oxygen saturation are monitored continuously during the procedure. The period of conscious sedation is 40 minutes, of which I was present face-to-face 100% of this time.   Estimated blood loss <50 mL. .    Coronary Findings   Dominance: Right  Left Anterior Descending  Prox LAD to Mid LAD lesion, 30% stenosed. The lesion is moderately calcified. The stenosis was measured by a visual reading. mild diffuse irregular nonobstructive proximal LAD stenosis  Left Circumflex  Vessel was injected. Vessel is normal in caliber. Vessel is angiographically normal.  Right Coronary Artery  The vessel exhibits minimal luminal irregularities. The RCA is calcified without stenosis. This is a dominant vessel.  Right Posterior Descending Artery  Vessel is normal in size. Vessel is angiographically normal. The PDA is widely patent  Right Heart   Right Heart Pressures LV EDP is normal. Normal right heart hemodynamics with preserved cardiac output and no evidence of pulmonary hypertension    Left Heart   Left Ventricle LV end diastolic pressure is low.    Aorta Abdominal Aorta: The abdominal aorta is normal in size.    Coronary Diagrams   Diagnostic Diagram     Implants     No implant documentation for this case.  PACS Images   Show images for Cardiac catheterization   Link to Procedure Log   Procedure Log    Hemo Data   Flowsheet Row Most Recent Value  Fick Cardiac Output 5.82 L/min  Fick Cardiac Output Index 3.99 (L/min)/BSA  RA A Wave 6 mmHg  RA V Wave 4 mmHg  RA Mean 2 mmHg  RV Systolic Pressure 26 mmHg  RV Diastolic Pressure 0  mmHg  RV EDP 6 mmHg  PA Systolic Pressure 27 mmHg  PA Diastolic Pressure 5 mmHg  PA Mean 15 mmHg  PW A Wave 17 mmHg  PW V Wave 16 mmHg  PW Mean 10 mmHg  AO Systolic Pressure 96 mmHg  AO Diastolic Pressure 48 mmHg  AO Mean 68 mmHg  LV Systolic Pressure 96 mmHg  LV Diastolic Pressure 3 mmHg  LV EDP 8 mmHg  Arterial Occlusion Pressure Extended Systolic Pressure 82 mmHg  Arterial Occlusion Pressure Extended Diastolic Pressure 47 mmHg  Arterial Occlusion Pressure Extended Mean Pressure 60 mmHg  Left Ventricular Apex Extended Systolic Pressure 123XX123 mmHg  Left Ventricular Apex Extended Diastolic Pressure 3 mmHg  Left Ventricular Apex Extended EDP Pressure 9 mmHg  QP/QS 0.83  TPVR Index 4.55 HRUI  TSVR Index 17.05 HRUI  PVR SVR Ratio 0.09  TPVR/TSVR Ratio 0.27    CT ANGIOGRAPHY CHEST, ABDOMEN, PELVIS WITH CONTRAST  TECHNIQUE: Multidetector CT imaging of the chest, abdomen, pelvis was performed using the standard protocol during bolus administration of intravenous contrast. Multiplanar CT image reconstructions and MIPs were obtained to evaluate the vascular anatomy.  CONTRAST:  75 mL Isovue 370 IV  COMPARISON:  CT chest 09/20/2015  FINDINGS: CHEST  Vascular: Right arm IV contrast administration. SVC patent. Satisfactory opacification of pulmonary arteries noted, and there is no evidence of pulmonary emboli. Patent bilateral pulmonary veins drain into the left atrium. Mild left atrial enlargement. No left atrial appendage thrombus. Scattered coronary calcifications. Adequate contrast opacification of the thoracic aorta with no evidence of dissection, aneurysm, or stenosis. There is classic 3-vessel brachiocephalic arch anatomy without proximal stenosis. Minimal plaque at the origin of the left subclavian artery without stenosis. Otherwise no significant atheromatous irregularity.  Mediastinum/Lymph Nodes: No pericardial effusion. No masses or pathologically enlarged  lymph nodes identified.  Lungs/Pleura: Linear scarring or subsegmental atelectasis in the inferior aspect of the lingula and right middle lobe, slightly increased since previous exam. Lungs otherwise clear. No effusion. No pneumothorax.  Musculoskeletal: No chest wall mass or suspicious bone lesions identified.  ABDOMEN  Arterial  Aorta: No aneurysm,  dissection, or stenosis. Mild partially calcified plaque in the infrarenal segment.  Celiac axis: Separate origins of the common hepatic artery and splenic artery, both widely patent. Left gastric arises from the splenic artery. 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  Superior mesenteric:  Patent, classic distal branch anatomy.  Left renal: Single, patent, with mild nonocclusive calcified ostial plaque.  Right renal:          Single, patent  Inferior mesenteric:  Patent  Left iliac: Minimal calcified plaque at the origin of the common iliac. No aneurysm or stenosis. No significant tortuosity.  Right iliac: Scattered calcified nonocclusive plaque in the common iliac. No aneurysm or stenosis. No significant tortuosity.  Venous  Dedicated venous phase imaging not obtained. Patent bilateral renal veins, splenic vein, and portal vein noted.  Review of the MIP images confirms the above findings.  Nonvasular  Hepatobiliary: No masses or other significant abnormality.  Pancreas: No mass, inflammatory changes, or other significant abnormality.  Spleen: Within normal limits in size and appearance.  Adrenals/Urinary Tract: No masses identified. No evidence of hydronephrosis. Mild wall thickening of the urinary bladder which is physiologically distended.  Stomach/Bowel: No evidence of obstruction, inflammatory process, or abnormal fluid collections.  Lymphatic: No pathologically enlarged lymph nodes.  Reproductive: Coarse  calcification in the anterior uterine body probably degenerated fibroid. Otherwise unremarkable.  Other: No ascites.  No free air.  Musculoskeletal: Early degenerative spurring in bilateral hips. Negative for fracture.  IMPRESSION: 1. Mild aortoiliac plaque without aneurysm, stenosis, or significant tortuosity. 2. Coronary calcifications 3. Some minimal increase in patchy infiltrate or subsegmental atelectasis in the inferior lingula and right middle lobe.   Electronically Signed   By: Lucrezia Europe M.D.   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 I have personally reviewed the patient's recent transesophageal echocardiogram, transthoracic echocardiogram, cardiac catheterization and CT angiograms. 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 There is an obvious flail segment of the posterior leaflet with ruptured chordae tendineae. There is severe mitral regurgitation with a jet of regurgitation that courses anteriorly around the entire left atrium. There is normal left ventricular size and systolic function. No other significant abnormalities are noted.  Diagnostic cardiac catheterization reveals no significant coronary artery disease and normal right-sided pressures. CT angiography reveals no significant findings including no contraindications to peripheral arterial cannulation for surgery.  The patient is otherwise healthy and remains active physically. She specifically denies any symptoms of exertional shortness of breath, chest discomfort, or worsening fatigue. Options include continued watchful waiting with close follow-up versus elective mitral valve repair. Based upon review of the patient's echocardiograms I feel there is a greater than 95% likelihood of successful and durable mitral valve repair. Operative risks should be very  low. The patient appears to be a good candidate for minimally invasive approach for surgery.    Plan:  The patient and her husband were again counseled at length regarding the indications, risks and potential benefits of mitral valve repair.  The rationale for elective surgery has been explained, including a comparison between surgery and continued medical therapy with close follow-up.  Expectations for the patient's postoperative recovery at been discussed at length.   The patient understands and accepts all potential risks of surgery including but not limited to risk of death, stroke or other neurologic complication,  myocardial infarction, congestive heart failure, respiratory failure, renal failure, bleeding requiring transfusion and/or reexploration, arrhythmia, infection or other wound complications, pneumonia, pleural and/or pericardial effusion, pulmonary embolus, aortic dissection or other major vascular complication, or delayed complications related to valve repair or replacement including but not limited to structural valve deterioration and failure, thrombosis, embolization, endocarditis, or paravalvular leak.  Alternative surgical approaches have been discussed including a comparison between conventional sternotomy and minimally-invasive techniques.  The relative risks and benefits of each have been reviewed as they pertain to the patient's specific circumstances, and all of their questions have been addressed.  Specific risks potentially related to the minimally-invasive approach were discussed at length, including but not limited to risk of conversion to full or partial sternotomy, aortic dissection or other major vascular complication, unilateral acute lung injury or pulmonary edema, phrenic nerve dysfunction or paralysis, rib fracture, chronic pain, lung hernia, or lymphocele.  All of their questions have been answered.    I spent in excess of 30 minutes during the conduct of this office  consultation and >50% of this time involved direct face-to-face encounter with the patient for counseling and/or coordination of their care.    Valentina Gu. Roxy Manns, MD 07/28/2016 5:05 PM

## 2016-07-29 ENCOUNTER — Inpatient Hospital Stay (HOSPITAL_COMMUNITY): Payer: Medicare Other | Admitting: Certified Registered Nurse Anesthetist

## 2016-07-29 ENCOUNTER — Inpatient Hospital Stay (HOSPITAL_COMMUNITY): Payer: Medicare Other

## 2016-07-29 ENCOUNTER — Encounter (HOSPITAL_COMMUNITY): Payer: Self-pay | Admitting: *Deleted

## 2016-07-29 ENCOUNTER — Encounter (HOSPITAL_COMMUNITY)
Admission: RE | Disposition: A | Discharge status: Skilled Nursing Facility | Payer: Self-pay | Source: Ambulatory Visit | Attending: Thoracic Surgery (Cardiothoracic Vascular Surgery)

## 2016-07-29 ENCOUNTER — Inpatient Hospital Stay (HOSPITAL_COMMUNITY)
Admission: RE | Admit: 2016-07-29 | Discharge: 2016-08-05 | DRG: 219 | Disposition: A | Discharge status: Skilled Nursing Facility | Payer: Medicare Other | Source: Ambulatory Visit | Attending: Thoracic Surgery (Cardiothoracic Vascular Surgery) | Admitting: Thoracic Surgery (Cardiothoracic Vascular Surgery)

## 2016-07-29 DIAGNOSIS — J9811 Atelectasis: Secondary | ICD-10-CM | POA: Diagnosis not present

## 2016-07-29 DIAGNOSIS — E7800 Pure hypercholesterolemia, unspecified: Secondary | ICD-10-CM | POA: Diagnosis present

## 2016-07-29 DIAGNOSIS — D6959 Other secondary thrombocytopenia: Secondary | ICD-10-CM | POA: Diagnosis not present

## 2016-07-29 DIAGNOSIS — D72829 Elevated white blood cell count, unspecified: Secondary | ICD-10-CM

## 2016-07-29 DIAGNOSIS — D62 Acute posthemorrhagic anemia: Secondary | ICD-10-CM

## 2016-07-29 DIAGNOSIS — I2510 Atherosclerotic heart disease of native coronary artery without angina pectoris: Secondary | ICD-10-CM | POA: Diagnosis present

## 2016-07-29 DIAGNOSIS — E871 Hypo-osmolality and hyponatremia: Secondary | ICD-10-CM

## 2016-07-29 DIAGNOSIS — J939 Pneumothorax, unspecified: Secondary | ICD-10-CM

## 2016-07-29 DIAGNOSIS — K5900 Constipation, unspecified: Secondary | ICD-10-CM | POA: Diagnosis not present

## 2016-07-29 DIAGNOSIS — Z9889 Other specified postprocedural states: Secondary | ICD-10-CM

## 2016-07-29 DIAGNOSIS — Z419 Encounter for procedure for purposes other than remedying health state, unspecified: Secondary | ICD-10-CM

## 2016-07-29 DIAGNOSIS — G8918 Other acute postprocedural pain: Secondary | ICD-10-CM

## 2016-07-29 DIAGNOSIS — D696 Thrombocytopenia, unspecified: Secondary | ICD-10-CM

## 2016-07-29 DIAGNOSIS — E878 Other disorders of electrolyte and fluid balance, not elsewhere classified: Secondary | ICD-10-CM | POA: Diagnosis present

## 2016-07-29 DIAGNOSIS — R030 Elevated blood-pressure reading, without diagnosis of hypertension: Secondary | ICD-10-CM | POA: Diagnosis not present

## 2016-07-29 DIAGNOSIS — I058 Other rheumatic mitral valve diseases: Secondary | ICD-10-CM | POA: Diagnosis not present

## 2016-07-29 DIAGNOSIS — Z79899 Other long term (current) drug therapy: Secondary | ICD-10-CM | POA: Diagnosis not present

## 2016-07-29 DIAGNOSIS — Z853 Personal history of malignant neoplasm of breast: Secondary | ICD-10-CM

## 2016-07-29 DIAGNOSIS — D6852 Prothrombin gene mutation: Secondary | ICD-10-CM | POA: Diagnosis not present

## 2016-07-29 DIAGNOSIS — I340 Nonrheumatic mitral (valve) insufficiency: Secondary | ICD-10-CM | POA: Diagnosis not present

## 2016-07-29 DIAGNOSIS — Z9012 Acquired absence of left breast and nipple: Secondary | ICD-10-CM

## 2016-07-29 DIAGNOSIS — I511 Rupture of chordae tendineae, not elsewhere classified: Secondary | ICD-10-CM | POA: Diagnosis present

## 2016-07-29 DIAGNOSIS — R739 Hyperglycemia, unspecified: Secondary | ICD-10-CM

## 2016-07-29 DIAGNOSIS — E876 Hypokalemia: Secondary | ICD-10-CM | POA: Diagnosis not present

## 2016-07-29 DIAGNOSIS — I341 Nonrheumatic mitral (valve) prolapse: Secondary | ICD-10-CM | POA: Diagnosis present

## 2016-07-29 DIAGNOSIS — D6851 Activated protein C resistance: Secondary | ICD-10-CM | POA: Diagnosis present

## 2016-07-29 DIAGNOSIS — I158 Other secondary hypertension: Secondary | ICD-10-CM

## 2016-07-29 DIAGNOSIS — I349 Nonrheumatic mitral valve disorder, unspecified: Secondary | ICD-10-CM | POA: Diagnosis not present

## 2016-07-29 HISTORY — DX: Other specified postprocedural states: Z98.890

## 2016-07-29 HISTORY — PX: MITRAL VALVE REPAIR: SHX2039

## 2016-07-29 HISTORY — PX: TEE WITHOUT CARDIOVERSION: SHX5443

## 2016-07-29 LAB — POCT I-STAT 3, ART BLOOD GAS (G3+)
ACID-BASE DEFICIT: 2 mmol/L (ref 0.0–2.0)
ACID-BASE DEFICIT: 7 mmol/L — AB (ref 0.0–2.0)
ACID-BASE EXCESS: 6 mmol/L — AB (ref 0.0–2.0)
ACID-BASE EXCESS: 3 mmol/L — AB (ref 0.0–2.0)
BICARBONATE: 26.7 mmol/L (ref 20.0–28.0)
BICARBONATE: 22.9 mmol/L (ref 20.0–28.0)
BICARBONATE: 19.3 mmol/L — AB (ref 20.0–28.0)
Bicarbonate: 29.5 mmol/L — ABNORMAL HIGH (ref 20.0–28.0)
O2 SAT: 100 %
O2 Saturation: 100 %
O2 Saturation: 99 %
O2 Saturation: 99 %
PCO2 ART: 39.2 mmHg (ref 32.0–48.0)
PCO2 ART: 40.1 mmHg (ref 32.0–48.0)
PH ART: 7.496 — AB (ref 7.350–7.450)
PH ART: 7.288 — AB (ref 7.350–7.450)
PO2 ART: 425 mmHg — AB (ref 83.0–108.0)
PO2 ART: 118 mmHg — AB (ref 83.0–108.0)
PO2 ART: 138 mmHg — AB (ref 83.0–108.0)
Patient temperature: 97.9
TCO2: 31 mmol/L (ref 0–100)
TCO2: 28 mmol/L (ref 0–100)
TCO2: 24 mmol/L (ref 0–100)
TCO2: 20 mmol/L (ref 0–100)
pCO2 arterial: 34.5 mmHg (ref 32.0–48.0)
pCO2 arterial: 32.7 mmHg (ref 32.0–48.0)
pH, Arterial: 7.485 — ABNORMAL HIGH (ref 7.350–7.450)
pH, Arterial: 7.442 (ref 7.350–7.450)
pO2, Arterial: 406 mmHg — ABNORMAL HIGH (ref 83.0–108.0)

## 2016-07-29 LAB — POCT I-STAT, CHEM 8
BUN: 13 mg/dL (ref 6–20)
BUN: 11 mg/dL (ref 6–20)
BUN: 9 mg/dL (ref 6–20)
BUN: 10 mg/dL (ref 6–20)
BUN: 10 mg/dL (ref 6–20)
BUN: 9 mg/dL (ref 6–20)
BUN: 7 mg/dL (ref 6–20)
CALCIUM ION: 0.97 mmol/L — AB (ref 1.15–1.40)
CALCIUM ION: 1.2 mmol/L (ref 1.15–1.40)
CALCIUM ION: 1.07 mmol/L — AB (ref 1.15–1.40)
CALCIUM ION: 1 mmol/L — AB (ref 1.15–1.40)
CHLORIDE: 101 mmol/L (ref 101–111)
CHLORIDE: 99 mmol/L — AB (ref 101–111)
CHLORIDE: 101 mmol/L (ref 101–111)
CHLORIDE: 105 mmol/L (ref 101–111)
CREATININE: 0.3 mg/dL — AB (ref 0.44–1.00)
CREATININE: 0.5 mg/dL (ref 0.44–1.00)
Calcium, Ion: 1.24 mmol/L (ref 1.15–1.40)
Calcium, Ion: 1 mmol/L — ABNORMAL LOW (ref 1.15–1.40)
Calcium, Ion: 1.06 mmol/L — ABNORMAL LOW (ref 1.15–1.40)
Chloride: 99 mmol/L — ABNORMAL LOW (ref 101–111)
Chloride: 102 mmol/L (ref 101–111)
Chloride: 101 mmol/L (ref 101–111)
Creatinine, Ser: 0.2 mg/dL — ABNORMAL LOW (ref 0.44–1.00)
Creatinine, Ser: 0.2 mg/dL — ABNORMAL LOW (ref 0.44–1.00)
Creatinine, Ser: 0.2 mg/dL — ABNORMAL LOW (ref 0.44–1.00)
Creatinine, Ser: 0.3 mg/dL — ABNORMAL LOW (ref 0.44–1.00)
GLUCOSE: 128 mg/dL — AB (ref 65–99)
GLUCOSE: 128 mg/dL — AB (ref 65–99)
GLUCOSE: 99 mg/dL (ref 65–99)
GLUCOSE: 118 mg/dL — AB (ref 65–99)
Glucose, Bld: 116 mg/dL — ABNORMAL HIGH (ref 65–99)
Glucose, Bld: 122 mg/dL — ABNORMAL HIGH (ref 65–99)
Glucose, Bld: 106 mg/dL — ABNORMAL HIGH (ref 65–99)
HCT: 32 % — ABNORMAL LOW (ref 36.0–46.0)
HCT: 25 % — ABNORMAL LOW (ref 36.0–46.0)
HCT: 30 % — ABNORMAL LOW (ref 36.0–46.0)
HCT: 23 % — ABNORMAL LOW (ref 36.0–46.0)
HEMATOCRIT: 20 % — AB (ref 36.0–46.0)
HEMATOCRIT: 23 % — AB (ref 36.0–46.0)
HEMATOCRIT: 26 % — AB (ref 36.0–46.0)
HEMOGLOBIN: 10.9 g/dL — AB (ref 12.0–15.0)
HEMOGLOBIN: 7.8 g/dL — AB (ref 12.0–15.0)
HEMOGLOBIN: 6.8 g/dL — AB (ref 12.0–15.0)
HEMOGLOBIN: 7.8 g/dL — AB (ref 12.0–15.0)
Hemoglobin: 10.2 g/dL — ABNORMAL LOW (ref 12.0–15.0)
Hemoglobin: 8.5 g/dL — ABNORMAL LOW (ref 12.0–15.0)
Hemoglobin: 8.8 g/dL — ABNORMAL LOW (ref 12.0–15.0)
POTASSIUM: 3.5 mmol/L (ref 3.5–5.1)
POTASSIUM: 4.4 mmol/L (ref 3.5–5.1)
Potassium: 4 mmol/L (ref 3.5–5.1)
Potassium: 3.9 mmol/L (ref 3.5–5.1)
Potassium: 4 mmol/L (ref 3.5–5.1)
Potassium: 4.1 mmol/L (ref 3.5–5.1)
Potassium: 4.3 mmol/L (ref 3.5–5.1)
SODIUM: 141 mmol/L (ref 135–145)
SODIUM: 140 mmol/L (ref 135–145)
SODIUM: 140 mmol/L (ref 135–145)
SODIUM: 140 mmol/L (ref 135–145)
SODIUM: 137 mmol/L (ref 135–145)
Sodium: 138 mmol/L (ref 135–145)
Sodium: 138 mmol/L (ref 135–145)
TCO2: 31 mmol/L (ref 0–100)
TCO2: 27 mmol/L (ref 0–100)
TCO2: 31 mmol/L (ref 0–100)
TCO2: 30 mmol/L (ref 0–100)
TCO2: 28 mmol/L (ref 0–100)
TCO2: 28 mmol/L (ref 0–100)
TCO2: 22 mmol/L (ref 0–100)

## 2016-07-29 LAB — CBC
HEMATOCRIT: 30.3 % — AB (ref 36.0–46.0)
HEMATOCRIT: 29 % — AB (ref 36.0–46.0)
HEMOGLOBIN: 9.8 g/dL — AB (ref 12.0–15.0)
HEMOGLOBIN: 9.7 g/dL — AB (ref 12.0–15.0)
MCH: 29.9 pg (ref 26.0–34.0)
MCH: 31.1 pg (ref 26.0–34.0)
MCHC: 32.3 g/dL (ref 30.0–36.0)
MCHC: 33.4 g/dL (ref 30.0–36.0)
MCV: 92.4 fL (ref 78.0–100.0)
MCV: 92.9 fL (ref 78.0–100.0)
Platelets: 97 10*3/uL — ABNORMAL LOW (ref 150–400)
Platelets: 90 10*3/uL — ABNORMAL LOW (ref 150–400)
RBC: 3.28 MIL/uL — AB (ref 3.87–5.11)
RBC: 3.12 MIL/uL — ABNORMAL LOW (ref 3.87–5.11)
RDW: 13.4 % (ref 11.5–15.5)
RDW: 13.5 % (ref 11.5–15.5)
WBC: 13.3 10*3/uL — AB (ref 4.0–10.5)
WBC: 18 10*3/uL — ABNORMAL HIGH (ref 4.0–10.5)

## 2016-07-29 LAB — BLOOD GAS, ARTERIAL
Acid-Base Excess: 4.6 mmol/L — ABNORMAL HIGH (ref 0.0–2.0)
Bicarbonate: 29.4 mmol/L — ABNORMAL HIGH (ref 20.0–28.0)
DRAWN BY: 449841
O2 Saturation: 97.5 %
PATIENT TEMPERATURE: 98.6
PH ART: 7.383 (ref 7.350–7.450)
PO2 ART: 99.7 mmHg (ref 83.0–108.0)
pCO2 arterial: 50.5 mmHg — ABNORMAL HIGH (ref 32.0–48.0)

## 2016-07-29 LAB — MAGNESIUM: MAGNESIUM: 2.8 mg/dL — AB (ref 1.7–2.4)

## 2016-07-29 LAB — GLUCOSE, CAPILLARY
GLUCOSE-CAPILLARY: 129 mg/dL — AB (ref 65–99)
GLUCOSE-CAPILLARY: 77 mg/dL (ref 65–99)
Glucose-Capillary: 102 mg/dL — ABNORMAL HIGH (ref 65–99)
Glucose-Capillary: 83 mg/dL (ref 65–99)

## 2016-07-29 LAB — PREPARE RBC (CROSSMATCH)

## 2016-07-29 LAB — PROTIME-INR
INR: 1.53
Prothrombin Time: 18.5 seconds — ABNORMAL HIGH (ref 11.4–15.2)

## 2016-07-29 LAB — CREATININE, SERUM
Creatinine, Ser: 0.45 mg/dL (ref 0.44–1.00)
GFR calc non Af Amer: >60 mL/min (ref >60)

## 2016-07-29 LAB — HEMOGLOBIN AND HEMATOCRIT, BLOOD
HCT: 20.5 % — ABNORMAL LOW (ref 36.0–46.0)
HEMOGLOBIN: 7 g/dL — AB (ref 12.0–15.0)

## 2016-07-29 LAB — POCT I-STAT 4, (NA,K, GLUC, HGB,HCT)
GLUCOSE: 83 mg/dL (ref 65–99)
HEMATOCRIT: 29 % — AB (ref 36.0–46.0)
Hemoglobin: 9.9 g/dL — ABNORMAL LOW (ref 12.0–15.0)
Potassium: 3.4 mmol/L — ABNORMAL LOW (ref 3.5–5.1)
SODIUM: 141 mmol/L (ref 135–145)

## 2016-07-29 LAB — PLATELET COUNT: PLATELETS: 88 10*3/uL — AB (ref 150–400)

## 2016-07-29 LAB — APTT: APTT: 42 s — AB (ref 24–36)

## 2016-07-29 SURGERY — REPAIR, MITRAL VALVE, MINIMALLY INVASIVE
Anesthesia: General | Site: Chest | Laterality: Right

## 2016-07-29 MED ORDER — HEPARIN SODIUM (PORCINE) 1000 UNIT/ML IJ SOLN
INTRAMUSCULAR | Status: DC | PRN
  Administered 2016-07-29: 18000 [IU] via INTRAVENOUS

## 2016-07-29 MED ORDER — ROCURONIUM BROMIDE 100 MG/10ML IV SOLN
INTRAVENOUS | Status: DC | PRN
  Administered 2016-07-29: 50 mg via INTRAVENOUS
  Administered 2016-07-29: 25 mg via INTRAVENOUS
  Administered 2016-07-29: 10 mg via INTRAVENOUS
  Administered 2016-07-29: 25 mg via INTRAVENOUS

## 2016-07-29 MED ORDER — ASPIRIN EC 325 MG PO TBEC
325.0000 mg | DELAYED_RELEASE_TABLET | Freq: Every day | ORAL | Status: DC
  Administered 2016-07-30 – 2016-07-31 (×2): 325 mg via ORAL
  Filled 2016-07-29 (×2): qty 1

## 2016-07-29 MED ORDER — DEXMEDETOMIDINE HCL IN NACL 200 MCG/50ML IV SOLN
0.0000 ug/kg/h | INTRAVENOUS | Status: DC
  Filled 2016-07-29: qty 50

## 2016-07-29 MED ORDER — MAGNESIUM SULFATE 4 GM/100ML IV SOLN
4.0000 g | Freq: Once | INTRAVENOUS | Status: AC
  Administered 2016-07-29: 4 g via INTRAVENOUS
  Filled 2016-07-29: qty 100

## 2016-07-29 MED ORDER — ONDANSETRON HCL 4 MG/2ML IJ SOLN
4.0000 mg | Freq: Four times a day (QID) | INTRAMUSCULAR | Status: DC | PRN
  Administered 2016-07-29 – 2016-08-01 (×4): 4 mg via INTRAVENOUS
  Filled 2016-07-29 (×3): qty 2

## 2016-07-29 MED ORDER — METOPROLOL TARTRATE 25 MG/10 ML ORAL SUSPENSION: 12.5000 mg | Freq: Two times a day (BID) | ORAL | Status: DC

## 2016-07-29 MED ORDER — METOPROLOL TARTRATE 12.5 MG HALF TABLET: 12.5000 mg | ORAL_TABLET | Freq: Two times a day (BID) | ORAL | Status: DC

## 2016-07-29 MED ORDER — LACTATED RINGERS IV SOLN: INTRAVENOUS | Status: DC

## 2016-07-29 MED ORDER — GLYCOPYRROLATE 0.2 MG/ML IV SOSY
PREFILLED_SYRINGE | INTRAVENOUS | Status: AC
  Filled 2016-07-29: qty 3

## 2016-07-29 MED ORDER — BUPIVACAINE HCL (PF) 0.75 % IJ SOLN
INTRAMUSCULAR | Status: AC
  Filled 2016-07-29: qty 10

## 2016-07-29 MED ORDER — SODIUM CHLORIDE 0.45 % IV SOLN
INTRAVENOUS | Status: DC | PRN
  Administered 2016-07-29: 15:00:00 via INTRAVENOUS

## 2016-07-29 MED ORDER — ACETAMINOPHEN 500 MG PO TABS
1000.0000 mg | ORAL_TABLET | Freq: Four times a day (QID) | ORAL | Status: AC
  Administered 2016-07-30 – 2016-08-03 (×12): 1000 mg via ORAL
  Filled 2016-07-29 (×10): qty 2

## 2016-07-29 MED ORDER — CEFUROXIME SODIUM 1.5 G IJ SOLR
1.5000 g | Freq: Two times a day (BID) | INTRAMUSCULAR | Status: AC
  Administered 2016-07-30 – 2016-07-31 (×4): 1.5 g via INTRAVENOUS
  Filled 2016-07-29 (×4): qty 1.5

## 2016-07-29 MED ORDER — BISACODYL 10 MG RE SUPP: 10.0000 mg | Freq: Every day | RECTAL | Status: DC

## 2016-07-29 MED ORDER — SODIUM CHLORIDE 0.9% FLUSH
3.0000 mL | Freq: Two times a day (BID) | INTRAVENOUS | Status: DC
  Administered 2016-07-30 – 2016-08-02 (×4): 3 mL via INTRAVENOUS

## 2016-07-29 MED ORDER — VANCOMYCIN HCL IN DEXTROSE 1-5 GM/200ML-% IV SOLN
1000.0000 mg | Freq: Once | INTRAVENOUS | Status: AC
  Administered 2016-07-29: 1000 mg via INTRAVENOUS
  Filled 2016-07-29: qty 200

## 2016-07-29 MED ORDER — DOCUSATE SODIUM 100 MG PO CAPS
200.0000 mg | ORAL_CAPSULE | Freq: Every day | ORAL | Status: DC
  Administered 2016-07-30 – 2016-08-04 (×4): 200 mg via ORAL
  Filled 2016-07-29 (×5): qty 2

## 2016-07-29 MED ORDER — HEPARIN SODIUM (PORCINE) 1000 UNIT/ML IJ SOLN
INTRAMUSCULAR | Status: AC
  Filled 2016-07-29: qty 2

## 2016-07-29 MED ORDER — ALBUMIN HUMAN 5 % IV SOLN
INTRAVENOUS | Status: DC | PRN
  Administered 2016-07-29: 14:00:00 via INTRAVENOUS

## 2016-07-29 MED ORDER — BUPIVACAINE HCL (PF) 0.5 % IJ SOLN
INTRAMUSCULAR | Status: DC | PRN
  Administered 2016-07-29: 10 mL

## 2016-07-29 MED ORDER — FENTANYL CITRATE (PF) 250 MCG/5ML IJ SOLN
INTRAMUSCULAR | Status: AC
  Filled 2016-07-29: qty 20

## 2016-07-29 MED ORDER — MIDAZOLAM HCL 5 MG/5ML IJ SOLN
INTRAMUSCULAR | Status: DC | PRN
  Administered 2016-07-29 (×3): 1 mg via INTRAVENOUS
  Administered 2016-07-29 (×2): 2 mg via INTRAVENOUS

## 2016-07-29 MED ORDER — INSULIN ASPART 100 UNIT/ML ~~LOC~~ SOLN
0.0000 [IU] | SUBCUTANEOUS | Status: DC
  Administered 2016-07-30 (×4): 2 [IU] via SUBCUTANEOUS

## 2016-07-29 MED ORDER — BUPIVACAINE 0.5 % ON-Q PUMP SINGLE CATH 400 ML
400.0000 mL | INJECTION | Status: AC
  Administered 2016-07-29: 400 mL
  Filled 2016-07-29: qty 400

## 2016-07-29 MED ORDER — 0.9 % SODIUM CHLORIDE (POUR BTL) OPTIME
TOPICAL | Status: DC | PRN
  Administered 2016-07-29: 1000 mL
  Administered 2016-07-29: 5000 mL

## 2016-07-29 MED ORDER — DOPAMINE-DEXTROSE 3.2-5 MG/ML-% IV SOLN: 0.0000 ug/kg/min | INTRAVENOUS | Status: DC

## 2016-07-29 MED ORDER — ACETAMINOPHEN 650 MG RE SUPP
650.0000 mg | Freq: Once | RECTAL | Status: AC
  Administered 2016-07-29: 650 mg via RECTAL

## 2016-07-29 MED ORDER — INSULIN REGULAR BOLUS VIA INFUSION
0.0000 [IU] | Freq: Three times a day (TID) | INTRAVENOUS | Status: DC
  Filled 2016-07-29: qty 10

## 2016-07-29 MED ORDER — PROTAMINE SULFATE 10 MG/ML IV SOLN
INTRAVENOUS | Status: DC | PRN
  Administered 2016-07-29: 20 mg via INTRAVENOUS
  Administered 2016-07-29 (×2): 30 mg via INTRAVENOUS
  Administered 2016-07-29: 10 mg via INTRAVENOUS
  Administered 2016-07-29 (×3): 30 mg via INTRAVENOUS
  Administered 2016-07-29: 20 mg via INTRAVENOUS

## 2016-07-29 MED ORDER — SODIUM CHLORIDE 0.9 % IV SOLN: INTRAVENOUS | Status: DC

## 2016-07-29 MED ORDER — SODIUM CHLORIDE 0.9 % IV SOLN
250.0000 mL | INTRAVENOUS | Status: DC
  Administered 2016-07-29: 14:00:00 via INTRAVENOUS

## 2016-07-29 MED ORDER — ACETAMINOPHEN 160 MG/5ML PO SOLN: 1000.0000 mg | Freq: Four times a day (QID) | ORAL | Status: DC

## 2016-07-29 MED ORDER — NITROGLYCERIN IN D5W 200-5 MCG/ML-% IV SOLN: 0.0000 ug/min | INTRAVENOUS | Status: DC

## 2016-07-29 MED ORDER — FENTANYL CITRATE (PF) 250 MCG/5ML IJ SOLN
INTRAMUSCULAR | Status: DC | PRN
  Administered 2016-07-29: 100 ug via INTRAVENOUS
  Administered 2016-07-29: 50 ug via INTRAVENOUS
  Administered 2016-07-29: 150 ug via INTRAVENOUS
  Administered 2016-07-29: 250 ug via INTRAVENOUS
  Administered 2016-07-29: 100 ug via INTRAVENOUS
  Administered 2016-07-29: 150 ug via INTRAVENOUS
  Administered 2016-07-29 (×2): 100 ug via INTRAVENOUS

## 2016-07-29 MED ORDER — MIDAZOLAM HCL 2 MG/2ML IJ SOLN
2.0000 mg | INTRAMUSCULAR | Status: DC | PRN
Start: 2016-07-29 — End: 2016-07-30

## 2016-07-29 MED ORDER — LACTATED RINGERS IV SOLN
INTRAVENOUS | Status: DC
  Administered 2016-07-29: 08:00:00 via INTRAVENOUS

## 2016-07-29 MED ORDER — BISACODYL 5 MG PO TBEC
10.0000 mg | DELAYED_RELEASE_TABLET | Freq: Every day | ORAL | Status: DC
  Administered 2016-07-30 – 2016-08-03 (×3): 10 mg via ORAL
  Filled 2016-07-29 (×4): qty 2

## 2016-07-29 MED ORDER — CHLORHEXIDINE GLUCONATE 4 % EX LIQD: 30.0000 mL | CUTANEOUS | Status: DC

## 2016-07-29 MED ORDER — FAMOTIDINE IN NACL 20-0.9 MG/50ML-% IV SOLN
20.0000 mg | Freq: Two times a day (BID) | INTRAVENOUS | Status: AC
  Administered 2016-07-29 (×2): 20 mg via INTRAVENOUS
  Filled 2016-07-29: qty 50

## 2016-07-29 MED ORDER — OXYCODONE HCL 5 MG PO TABS: 5.0000 mg | ORAL_TABLET | ORAL | Status: DC | PRN

## 2016-07-29 MED ORDER — ACETAMINOPHEN 160 MG/5ML PO SOLN: 650.0000 mg | Freq: Once | ORAL | Status: AC

## 2016-07-29 MED ORDER — PANTOPRAZOLE SODIUM 40 MG PO TBEC
40.0000 mg | DELAYED_RELEASE_TABLET | Freq: Every day | ORAL | Status: DC
  Administered 2016-07-31 – 2016-08-05 (×6): 40 mg via ORAL
  Filled 2016-07-29 (×7): qty 1

## 2016-07-29 MED ORDER — POTASSIUM CHLORIDE 10 MEQ/50ML IV SOLN
10.0000 meq | INTRAVENOUS | Status: AC
  Administered 2016-07-29 (×3): 10 meq via INTRAVENOUS

## 2016-07-29 MED ORDER — ALBUMIN HUMAN 5 % IV SOLN
250.0000 mL | INTRAVENOUS | Status: AC | PRN
  Administered 2016-07-29 (×2): 250 mL via INTRAVENOUS

## 2016-07-29 MED ORDER — SODIUM CHLORIDE 0.9 % IR SOLN
Status: DC | PRN
  Administered 2016-07-29: 1000 mL
  Administered 2016-07-29: 3000 mL

## 2016-07-29 MED ORDER — MIDAZOLAM HCL 10 MG/2ML IJ SOLN
INTRAMUSCULAR | Status: AC
  Filled 2016-07-29: qty 2

## 2016-07-29 MED ORDER — LACTATED RINGERS IV SOLN: 500.0000 mL | Freq: Once | INTRAVENOUS | Status: DC | PRN

## 2016-07-29 MED ORDER — PROPOFOL 10 MG/ML IV BOLUS
INTRAVENOUS | Status: DC | PRN
  Administered 2016-07-29: 100 mg via INTRAVENOUS

## 2016-07-29 MED ORDER — ROCURONIUM BROMIDE 10 MG/ML (PF) SYRINGE
PREFILLED_SYRINGE | INTRAVENOUS | Status: AC
  Filled 2016-07-29: qty 20

## 2016-07-29 MED ORDER — SODIUM CHLORIDE 0.9% FLUSH: 3.0000 mL | INTRAVENOUS | Status: DC | PRN

## 2016-07-29 MED ORDER — ARTIFICIAL TEARS OP OINT
TOPICAL_OINTMENT | OPHTHALMIC | Status: AC
  Filled 2016-07-29: qty 3.5

## 2016-07-29 MED ORDER — TRAMADOL HCL 50 MG PO TABS
50.0000 mg | ORAL_TABLET | ORAL | Status: DC | PRN
  Administered 2016-07-30 (×2): 50 mg via ORAL
  Administered 2016-07-31: 100 mg via ORAL
  Administered 2016-07-31 – 2016-08-04 (×4): 50 mg via ORAL
  Filled 2016-07-29 (×4): qty 1
  Filled 2016-07-29: qty 2
  Filled 2016-07-29 (×2): qty 1

## 2016-07-29 MED ORDER — ASPIRIN 81 MG PO CHEW: 324.0000 mg | CHEWABLE_TABLET | Freq: Every day | ORAL | Status: DC

## 2016-07-29 MED ORDER — PROTAMINE SULFATE 10 MG/ML IV SOLN
INTRAVENOUS | Status: AC
  Filled 2016-07-29: qty 20

## 2016-07-29 MED ORDER — ORAL CARE MOUTH RINSE
15.0000 mL | Freq: Two times a day (BID) | OROMUCOSAL | Status: DC
  Administered 2016-07-30 – 2016-08-04 (×9): 15 mL via OROMUCOSAL

## 2016-07-29 MED ORDER — SODIUM CHLORIDE 0.9 % IV SOLN
INTRAVENOUS | Status: DC
  Administered 2016-07-29: 15:00:00 via INTRAVENOUS

## 2016-07-29 MED ORDER — METOPROLOL TARTRATE 5 MG/5ML IV SOLN: 2.5000 mg | INTRAVENOUS | Status: DC | PRN

## 2016-07-29 MED ORDER — MORPHINE SULFATE (PF) 2 MG/ML IV SOLN
1.0000 mg | INTRAVENOUS | Status: DC | PRN
  Administered 2016-07-30: 2 mg via INTRAVENOUS
  Filled 2016-07-29: qty 1

## 2016-07-29 MED ORDER — PROPOFOL 10 MG/ML IV BOLUS
INTRAVENOUS | Status: AC
  Filled 2016-07-29: qty 20

## 2016-07-29 MED ORDER — CHLORHEXIDINE GLUCONATE 0.12 % MT SOLN
15.0000 mL | OROMUCOSAL | Status: AC
  Administered 2016-07-29: 15 mL via OROMUCOSAL

## 2016-07-29 MED ORDER — MORPHINE SULFATE (PF) 2 MG/ML IV SOLN: 1.0000 mg | INTRAVENOUS | Status: DC | PRN

## 2016-07-29 MED ORDER — POTASSIUM CHLORIDE 10 MEQ/50ML IV SOLN
10.0000 meq | Freq: Once | INTRAVENOUS | Status: AC
  Administered 2016-07-29: 10 meq via INTRAVENOUS

## 2016-07-29 MED ORDER — SODIUM CHLORIDE 0.9 % IV SOLN
INTRAVENOUS | Status: DC
  Filled 2016-07-29: qty 2.5

## 2016-07-29 MED ORDER — PHENYLEPHRINE HCL 10 MG/ML IJ SOLN
0.0000 ug/min | INTRAVENOUS | Status: DC
  Filled 2016-07-29: qty 2

## 2016-07-29 MED FILL — Heparin Sodium (Porcine) Inj 1000 Unit/ML: INTRAMUSCULAR | Qty: 30 | Status: AC

## 2016-07-29 MED FILL — Magnesium Sulfate Inj 50%: INTRAMUSCULAR | Qty: 10 | Status: AC

## 2016-07-29 MED FILL — Potassium Chloride Inj 2 mEq/ML: INTRAVENOUS | Qty: 40 | Status: AC

## 2016-07-29 SURGICAL SUPPLY — 103 items
ADAPTER CARDIO PERF ANTE/RETRO (ADAPTER) ×3 IMPLANT
BAG DECANTER FOR FLEXI CONT (MISCELLANEOUS) ×6 IMPLANT
BLADE SURG 11 STRL SS (BLADE) ×3 IMPLANT
CANISTER SUCTION 2500CC (MISCELLANEOUS) ×6 IMPLANT
CANNULA FEM VENOUS REMOTE 22FR (CANNULA) IMPLANT
CANNULA FEMORAL ART 14 SM (MISCELLANEOUS) ×3 IMPLANT
CANNULA GUNDRY RCSP 15FR (MISCELLANEOUS) ×3 IMPLANT
CANNULA OPTISITE PERFUSION 16F (CANNULA) ×3 IMPLANT
CANNULA OPTISITE PERFUSION 18F (CANNULA) IMPLANT
CANNULA SUMP PERICARDIAL (CANNULA) ×6 IMPLANT
CATH KIT ON Q 5IN SLV (PAIN MANAGEMENT) IMPLANT
CATH KIT ON-Q SILVERSOAK 5IN (CATHETERS) ×3 IMPLANT
CONN ST 1/4X3/8  BEN (MISCELLANEOUS) ×2
CONN ST 1/4X3/8 BEN (MISCELLANEOUS) ×4 IMPLANT
CONNECTOR 1/2X3/8X1/2 3 WAY (MISCELLANEOUS) ×1
CONNECTOR 1/2X3/8X1/2 3WAY (MISCELLANEOUS) ×2 IMPLANT
CONT SPEC 4OZ CLIKSEAL STRL BL (MISCELLANEOUS) ×3 IMPLANT
CONT SPEC STER OR (MISCELLANEOUS) ×3 IMPLANT
COVER BACK TABLE 24X17X13 BIG (DRAPES) ×3 IMPLANT
CRADLE DONUT ADULT HEAD (MISCELLANEOUS) ×3 IMPLANT
DERMABOND ADHESIVE PROPEN (GAUZE/BANDAGES/DRESSINGS) ×1
DERMABOND ADVANCED (GAUZE/BANDAGES/DRESSINGS) ×2
DERMABOND ADVANCED .7 DNX12 (GAUZE/BANDAGES/DRESSINGS) ×4 IMPLANT
DERMABOND ADVANCED .7 DNX6 (GAUZE/BANDAGES/DRESSINGS) ×2 IMPLANT
DEVICE PMI PUNCTURE CLOSURE (MISCELLANEOUS) ×3 IMPLANT
DEVICE SUT CK QUICK LOAD INDV (Prosthesis & Implant Heart) ×9 IMPLANT
DEVICE SUT CK QUICK LOAD MINI (Prosthesis & Implant Heart) ×18 IMPLANT
DEVICE TROCAR PUNCTURE CLOSURE (ENDOMECHANICALS) ×3 IMPLANT
DRAIN CHANNEL 28F RND 3/8 FF (WOUND CARE) ×6 IMPLANT
DRAPE BILATERAL SPLIT (DRAPES) ×3 IMPLANT
DRAPE C-ARM 42X72 X-RAY (DRAPES) ×3 IMPLANT
DRAPE CV SPLIT W-CLR ANES SCRN (DRAPES) ×3 IMPLANT
DRAPE INCISE IOBAN 66X45 STRL (DRAPES) ×9 IMPLANT
DRAPE SLUSH/WARMER DISC (DRAPES) ×3 IMPLANT
DRSG COVADERM 4X8 (GAUZE/BANDAGES/DRESSINGS) ×3 IMPLANT
ELECT BLADE 6.5 EXT (BLADE) ×3 IMPLANT
ELECT REM PT RETURN 9FT ADLT (ELECTROSURGICAL) ×6
ELECTRODE REM PT RTRN 9FT ADLT (ELECTROSURGICAL) ×4 IMPLANT
FELT TEFLON 1X6 (MISCELLANEOUS) ×6 IMPLANT
FEMORAL VENOUS CANN RAP (CANNULA) IMPLANT
GLOVE BIO SURGEON STRL SZ 6.5 (GLOVE) ×24 IMPLANT
GLOVE BIOGEL PI IND STRL 6.5 (GLOVE) ×8 IMPLANT
GLOVE BIOGEL PI INDICATOR 6.5 (GLOVE) ×4
GLOVE ORTHO TXT STRL SZ7.5 (GLOVE) ×9 IMPLANT
GLOVE SURG SS PI 6.0 STRL IVOR (GLOVE) ×3 IMPLANT
GOWN STRL REUS W/ TWL LRG LVL3 (GOWN DISPOSABLE) ×16 IMPLANT
GOWN STRL REUS W/TWL LRG LVL3 (GOWN DISPOSABLE) ×8
KIT BASIN OR (CUSTOM PROCEDURE TRAY) ×3 IMPLANT
KIT DILATOR VASC 18G NDL (KITS) ×3 IMPLANT
KIT DRAINAGE VACCUM ASSIST (KITS) ×3 IMPLANT
KIT ROOM TURNOVER OR (KITS) ×3 IMPLANT
KIT SUCTION CATH 14FR (SUCTIONS) ×3 IMPLANT
KIT SUT CK MINI COMBO 4X17 (Prosthesis & Implant Heart) ×36 IMPLANT
LEAD PACING MYOCARDI (MISCELLANEOUS) ×3 IMPLANT
LINE VENT (MISCELLANEOUS) ×3 IMPLANT
NEEDLE AORTIC ROOT 14G 7F (CATHETERS) ×3 IMPLANT
NS IRRIG 1000ML POUR BTL (IV SOLUTION) ×15 IMPLANT
PACK OPEN HEART (CUSTOM PROCEDURE TRAY) ×3 IMPLANT
PAD ARMBOARD 7.5X6 YLW CONV (MISCELLANEOUS) ×6 IMPLANT
PAD ELECT DEFIB RADIOL ZOLL (MISCELLANEOUS) ×3 IMPLANT
PATCH CORMATRIX 4CMX7CM (Prosthesis & Implant Heart) ×3 IMPLANT
RETRACTOR TRL SOFT TISSUE LG (INSTRUMENTS) IMPLANT
RETRACTOR TRM SOFT TISSUE 7.5 (INSTRUMENTS) ×3 IMPLANT
RING MITRAL MEMO 3D 34MM SMD34 (Prosthesis & Implant Heart) ×3 IMPLANT
SET CANNULATION TOURNIQUET (MISCELLANEOUS) ×3 IMPLANT
SET CARDIOPLEGIA MPS 5001102 (MISCELLANEOUS) ×3 IMPLANT
SET IRRIG TUBING LAPAROSCOPIC (IRRIGATION / IRRIGATOR) ×3 IMPLANT
SOLUTION ANTI FOG 6CC (MISCELLANEOUS) ×3 IMPLANT
SPONGE GAUZE 4X4 12PLY STER LF (GAUZE/BANDAGES/DRESSINGS) ×6 IMPLANT
SUT BONE WAX W31G (SUTURE) ×3 IMPLANT
SUT E-PACK MINIMALLY INVASIVE (SUTURE) ×3 IMPLANT
SUT ETHIBOND (SUTURE) ×3 IMPLANT
SUT ETHIBOND 2 0 SH (SUTURE) ×3 IMPLANT
SUT ETHIBOND 2-0 RB-1 WHT (SUTURE) ×6 IMPLANT
SUT ETHIBOND X763 2 0 SH 1 (SUTURE) ×3 IMPLANT
SUT GORETEX CV 4 TH 22 36 (SUTURE) ×3 IMPLANT
SUT GORETEX CV4 TH-18 (SUTURE) ×6 IMPLANT
SUT PROLENE 3 0 SH1 36 (SUTURE) ×12 IMPLANT
SUT PROLENE 4 0 RB 1 (SUTURE) ×11
SUT PROLENE 4-0 RB1 .5 CRCL 36 (SUTURE) ×22 IMPLANT
SUT PTFE CHORD X 24MM (SUTURE) ×6 IMPLANT
SUT SILK  1 MH (SUTURE) ×1
SUT SILK 1 MH (SUTURE) ×2 IMPLANT
SUT SILK 2 0 SH CR/8 (SUTURE) IMPLANT
SUT SILK 3 0 SH CR/8 (SUTURE) IMPLANT
SUT VIC AB 2-0 CTX 36 (SUTURE) IMPLANT
SUT VIC AB 3-0 SH 8-18 (SUTURE) IMPLANT
SUT VICRYL 2 TP 1 (SUTURE) IMPLANT
SYRINGE 10CC LL (SYRINGE) ×3 IMPLANT
SYSTEM SAHARA CHEST DRAIN ATS (WOUND CARE) ×3 IMPLANT
TAPE CLOTH SURG 4X10 WHT LF (GAUZE/BANDAGES/DRESSINGS) ×6 IMPLANT
TAPE PAPER 2X10 WHT MICROPORE (GAUZE/BANDAGES/DRESSINGS) ×3 IMPLANT
TOWEL OR 17X24 6PK STRL BLUE (TOWEL DISPOSABLE) ×6 IMPLANT
TOWEL OR 17X26 10 PK STRL BLUE (TOWEL DISPOSABLE) ×6 IMPLANT
TRAY FOLEY IC TEMP SENS 16FR (CATHETERS) ×3 IMPLANT
TROCAR XCEL BLADELESS 5X75MML (TROCAR) ×3 IMPLANT
TROCAR XCEL NON-BLD 11X100MML (ENDOMECHANICALS) ×6 IMPLANT
TUBE SUCT INTRACARD DLP 20F (MISCELLANEOUS) ×3 IMPLANT
TUNNELER SHEATH ON-Q 11GX8 DSP (PAIN MANAGEMENT) ×3 IMPLANT
UNDERPAD 30X30 (UNDERPADS AND DIAPERS) ×3 IMPLANT
WATER STERILE IRR 1000ML POUR (IV SOLUTION) ×6 IMPLANT
WIRE .035 3MM-J 145CM (WIRE) ×3 IMPLANT
WIRE J 3MM .035X145CM (WIRE) ×3 IMPLANT

## 2016-07-29 NOTE — Progress Notes (Signed)
Pt placed on ventilator and pressures lines were zeroed. Pt is stabel and RT will continue to monitor.

## 2016-07-29 NOTE — Op Note (Signed)
CARDIOTHORACIC SURGERY OPERATIVE NOTE  Date of Procedure:  07/29/2016  Preoperative Diagnosis: Severe Mitral Regurgitation  Postoperative Diagnosis: Same  Procedure:    Minimally-Invasive Mitral Valve Repair  Complex valvuloplasty including quadrangular resection of posterior leaflet x2  Sliding leaflet plasty  Artificial Gore-tex neochord placement x12  Sorin Memo 3D Ring Annuloplasty (size 39mm, catalog # [id number], serial # [id number])    Surgeon: Valentina Gu. Roxy Manns, MD  Assistant: Nicholes Rough, PA-C  Anesthesia: Crissie Sickles. 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  Bileaflet prolapse with severe prolapse of posterior leaflet, multiple ruptured primary chordae tendinae  Type II dysfunction with severe mitral regurgitation  Normal LV systolic function  Trivial residual mitral regurgitation after successful valve repair                      BRIEF CLINICAL NOTE AND INDICATIONS FOR SURGERY  Patient is a 70 year old female with history of mitral valve prolapse and mitral regurgitation, hyperlipidemia, breast cancer status post left mastectomy with reconstruction, and heterozygous status prothrombin gene mutation without any clinical history of significant bleeding diathesis or hypercoagulable state who has been referred for surgical consultation to discuss treatment options for management of severe mitral regurgitation. The patient states that she was noted to have a murmur on routine physical exam many years ago. She has been followed by Dr. Saunders Glance in [place] more than 15 years with known mitral valve prolapse and mitral regurgitation. Recent follow-up echocardiogram revealed significant progression of disease with what appeared to be partially flail segment of the posterior leaflet and severe mitral regurgitation. Transesophageal echocardiogram was performed, confirming the presence of severe mitral regurgitation with flail  segment of the posterior leaflet. There is normal left ventricular size and systolic function. No other significant abnormalities were identified. The patient was referred for surgical consultation.  The patient has been seen in consultation and counseled at length regarding the indications, risks and potential benefits of surgery.  All questions have been answered, and the patient provides full informed consent for the operation as described.    DETAILS OF THE OPERATIVE PROCEDURE  Preparation:  The patient is brought to the operating room on the above mentioned date and central monitoring was established by the anesthesia team including placement of Swan-Ganz catheter through the left internal jugular vein.  A radial arterial line is placed. The patient is placed in the supine position on the operating table.  Intravenous antibiotics are administered. General endotracheal anesthesia is induced uneventfully. The patient is initially intubated using a dual lumen endotracheal tube.  A Foley catheter is placed.  Baseline transesophageal echocardiogram was performed.  Findings were notable for degenerative disease of the mitral valve with bileaflet prolapse and severe mitral regurgitation.  There was severe prolapse involving the posterior leaflet. There were obvious ruptured chordae tendineae and a small flail segment of the posterior leaflet. Left ventricular systolic function appeared normal. No other significant abnormalities were noted.  A soft roll is placed behind the patient's left scapula and the neck gently extended and turned to the left.   The patient's right neck, chest, abdomen, both groins, and both lower extremities are prepared and draped in a sterile manner. A time out procedure is performed.  Surgical Approach:  A right miniature anterolateral thoracotomy incision is performed. The incision is placed just lateral to and superior to the right nipple. The pectoralis major muscle is  retracted medially and completely preserved. The right pleural space is entered  through the 3rd intercostal space. A soft tissue retractor is placed.  Two 11 mm ports are placed through separate stab incisions inferiorly. The right pleural space is insufflated continuously with carbon dioxide gas through the posterior port during the remainder of the operation.  A pledgeted sutures placed through the dome of the right hemidiaphragm and retracted inferiorly to facilitate exposure.  A longitudinal incision is made in the pericardium 3 cm anterior to the phrenic nerve and silk traction sutures are placed on either side of the incision for exposure.   Extracorporeal Cardiopulmonary Bypass and Myocardial Protection:  A small incision is made in the right inguinal crease and the anterior surface of the right common femoral artery and right common femoral vein are identified.  The patient is placed in Trendelenburg position. The right internal jugular vein is cannulated with Seldinger technique and a guidewire advanced into the right atrium. The patient is heparinized systemically. The right internal jugular vein is cannulated with a 14 [place] pediatric femoral venous cannula. Pursestring sutures are placed on the anterior surface of the right common femoral vein and right common femoral artery. The right common femoral vein is cannulated with the Seldinger technique and a guidewire is advanced under transesophageal echocardiogram guidance through the right atrium. The femoral vein is cannulated with a long 22 French femoral venous cannula. The right common femoral artery is cannulated with Seldinger technique and a flexible guidewire is advanced until it can be appreciated intraluminally in the descending thoracic aorta on transesophageal echocardiogram. The femoral artery is cannulated with an 18 French femoral arterial cannula.  Adequate heparinization is verified.     The entire pre-bypass portion of the  operation was notable for stable hemodynamics.  Cardiopulmonary bypass was begun.  Vacuum assist venous drainage is utilized. The incision in the pericardium is extended in both directions. Venous drainage and exposure are notably excellent. A retrograde cardioplegia cannula is placed through the right atrium into the coronary sinus using transesophageal echocardiogram guidance.  An antegrade cardioplegia cannula is placed in the ascending aorta.    The patient is cooled to 28C systemic temperature.  The aortic cross clamp is applied and cold blood cardioplegia is delivered initially in an antegrade fashion through the aortic root.   Supplemental cardioplegia is given retrograde through the coronary sinus catheter. The initial cardioplegic arrest is rapid with early diastolic arrest.  Repeat doses of cardioplegia are administered intermittently every 20 to 30 minutes throughout the entire cross clamp portion of the operation through the aortic root and through the coronary sinus catheter in order to maintain completely flat electrocardiogram.  Myocardial protection was felt to be excellent.   Mitral Valve Repair:  A left atriotomy incision was performed through the interatrial groove and extended partially across the back wall of the left atrium after opening the oblique sinus inferiorly.  The mitral valve is exposed using a self-retaining retractor.  The mitral valve was inspected and notable for forme fruste variants of Barlow's type degenerative disease.  The mitral valve was relatively large with billowing redundant leaflet tissue. The P2 segment of the posterior leaflet was comprised of 3 subsegments, each of which were severely prolapsing and relatively tall in height. There were multiple ruptured primary chordae tendineae to the side segment that was closest to P3. The remaining 2 subsegments had elongated cords without rupture. There was minimal annular calcification.  The small flail subsegment  of P2 was resected completely. The remaining 2 large subsegments of P2 were each separately  mobilized off the posterior annulus and shortened using quadrangular resections. A sliding leaflet plasty was performed to reimplant the posterior leaflet to the posterior annulus using a 2 layer closure of running 4-0 Prolene suture. The intervening vertical defect between the 2 subsegments of P2 and between P2 and P3 were each individually closed using everting simple interrupted 4-0 Prolene suture.  Artificial neochord placement was performed using Chord-X multi-strand CV-4 Goretex pre-measured loops.  The appropriate cord length was measured from corresponding normal length primary cords from the P1 segment of the posterior leaflet. The papillary muscle suture of the Chord-X multi-strand suture was placed through the head of the anterior papillary muscle in a horizontal mattress fashion and tied over Teflon felt pledgets. Each of the three pre-measured loops were then reimplanted into the free margin of the P2 segment of the posterior leaflet on the anterior side of midline.  A second set of pre-measured loops was placed into the posterior papillary muscle.  The papillary muscle suture of the Chord-X multi-strand suture was placed through the head of the posterior papillary muscle in a horizontal mattress fashion and tied over Teflon felt pledgets. Each of the three pre-measured loops were then reimplanted into the free margin of the P2 segment of the posterior leaflet on the posterior side of midline.    A total of 6 pairs of artificial Gore-Tex neochords were placed.  Interrupted 2-0 Ethibond horizontal mattress sutures are placed circumferentially around the entire mitral valve annulus.  The valve was tested with saline and appeared competent even without ring annuloplasty complete. The valve was sized to a 34 mm annuloplasty ring, based upon the transverse distance between the left and right commissures and the  height of the anterior leaflet, corresponding to a size just slightly larger than the overall surface area of the anterior leaflet.  A Sorin Memo 3D annuloplasty ring (size 67mm, catalog [id number], serial [id number]) was secured in place uneventfully. All ring sutures were secured using a Cor-knot device.  The valve was tested with saline and appeared competent.   The valve is again tested with saline and appears to be perfectly competent with a broad symmetrical line of coaptation of the anterior and posterior leaflet. There is no residual leak. There was a broad, symmetrical line of coaptation of the anterior and posterior leaflet which was confirmed using the blue ink test.  Rewarming is begun.   Procedure Completion:  The atriotomy was closed using a 2-layer closure of running 3-0 Prolene suture after placing a sump drain across the mitral valve to serve as a left ventricular vent.  One final dose of warm retrograde "hot shot" cardioplegia was administered retrograde through the coronary sinus catheter while all air was evacuated through the aortic root.  The aortic cross clamp was removed after a total cross clamp time of 115 minutes.  Epicardial pacing wires are fixed to the inferior wall of the right ventricule and to the right atrial appendage. The patient is rewarmed to 37C temperature. The left ventricular vent is removed.  The patient is ventilated and flow volumes turndown while the mitral valve repair is inspected using transesophageal echocardiogram. The valve repair appears intact with no residual leak. The antegrade cardioplegia cannula is now removed. The patient is weaned and disconnected from cardiopulmonary bypass.  The patient's rhythm at separation from bypass was sinus.  The patient was weaned from bypass without any inotropic support. Total cardiopulmonary bypass time for the operation was 153 minutes.  Followup transesophageal echocardiogram performed  after separation from bypass  revealed a well-seated annuloplasty ring in the mitral position with a normal functioning mitral valve. There was no residual leak.  Left ventricular function was unchanged from preoperatively.    The femoral arterial and venous cannulae were removed uneventfully. There was a palpable pulse in the distal right common femoral artery after removal of the cannula. Protamine was administered to reverse the anticoagulation. The right internal jugular cannula was removed and manual pressure held on the neck for 15 minutes.  Single lung ventilation was begun. The atriotomy closure was inspected for hemostasis. The pericardial sac was drained using a 28 French Bard drain placed through the anterior port incision.  The pericardium was closed using a patch of core matrix bovine submucosal tissue patch. The right pleural space is irrigated with saline solution and inspected for hemostasis. The right pleural space was drained using a 28 French Bard drain placed through the posterior port incision. The miniature thoracotomy incision was closed in multiple layers in routine fashion. The right groin incision was inspected for hemostasis and closed in multiple layers in routine fashion.  The post-bypass portion of the operation was notable for stable rhythm and hemodynamics.  No blood products were administered during the operation.   Disposition:  The patient tolerated the procedure well.  The patient was reintubated using a single lumen endotracheal tube and subsequently transported to the surgical intensive care unit in stable condition. There were no intraoperative complications. All sponge instrument and needle counts are verified correct at completion of the operation.     Valentina Gu. Roxy Manns MD 07/29/2016 2:41 PM

## 2016-07-29 NOTE — Anesthesia Procedure Notes (Signed)
Central Venous Catheter Insertion Performed by: anesthesiologist Patient location: Pre-op. Preanesthetic checklist: patient identified, IV checked, site marked, risks and benefits discussed, surgical consent, monitors and equipment checked, pre-op evaluation, timeout performed and anesthesia consent Landmarks identified PA cath was placed.Swan type and PA catheter depth:thermodilation and 50PA Cath depth:50 Procedure performed using ultrasound guided technique. Attempts: 1 Patient tolerated the procedure well with no immediate complications.

## 2016-07-29 NOTE — Procedures (Signed)
Extubation Procedure Note  Patient Details:   Name: Laura Galvan DOB: November 21, 1946 MRN: [id number]   Airway Documentation:     Evaluation  O2 sats: stable throughout Complications: No apparent complications Patient did tolerate procedure well. Bilateral Breath Sounds: Clear, Diminished   Yes Pt extubated on the above date at 22:43 hrs to 4 L/m humidified O2 via [place] subsequent to successful completion of rapid weaning protocol. Respiratory mechanics as follows: VC 1.42 L, NIF -40. Cuff leak was not present during weaning parameters. MD acknowledged for proceeding with extubation. No stridor noted post extubation. Pt able to speak with hoarse voice.   Sigismund Cross A [name] 07/29/2016, 10:59 PM

## 2016-07-29 NOTE — Interval H&P Note (Signed)
History and Physical Interval Note:  07/29/2016 6:31 AM  Laura Galvan  has presented today for surgery, with the diagnosis of MR  The various methods of treatment have been discussed with the patient and family. After consideration of risks, benefits and other options for treatment, the patient has consented to  Procedure(s): MINIMALLY INVASIVE MITRAL VALVE REPAIR (MVR) (Right) TRANSESOPHAGEAL ECHOCARDIOGRAM (TEE) (N/A) as a surgical intervention .  The patient's history has been reviewed, patient examined, no change in status, stable for surgery.  I have reviewed the patient's chart and labs.  Questions were answered to the patient's satisfaction.     Rexene Alberts

## 2016-07-29 NOTE — Anesthesia Procedure Notes (Signed)
Central Venous Catheter Insertion Performed by: anesthesiologist Patient location: OR. Preanesthetic checklist: patient identified, IV checked, site marked, risks and benefits discussed, surgical consent, monitors and equipment checked, pre-op evaluation, timeout performed and anesthesia consent Position: Trendelenburg Lidocaine 1% used for infiltration Landmarks identified Catheter size: 9 Fr Central line was placed.MAC introducer Procedure performed using ultrasound guided technique. Attempts: 1 Following insertion, line sutured and dressing applied. Post procedure assessment: blood return through all ports, free fluid flow and no air. Patient tolerated the procedure well with no immediate complications.

## 2016-07-29 NOTE — Progress Notes (Signed)
  Echocardiogram Echocardiogram Transesophageal has been performed.  Laura Galvan 07/29/2016, 10:15 AM

## 2016-07-29 NOTE — Progress Notes (Signed)
Advanced Heart Failure Rounding Note   Subjective:    Laura Galvan is a 70 year old female with history of mitral valve prolapse and mitral regurgitation, hyperlipidemia, and breast cancer status post left mastectomy with reconstruction.   Underwent minimally invasive MV repair today. Remains intubated. Diuresing well   PA 25/15 CO/CI 3.0/2.0 (done personally)    Objective:   Weight Range:  Vital Signs:   Temp:  [93.6 F (34.2 C)-97.9 F (36.6 C)] 97.9 F (36.6 C) (09/06 2200) Pulse Rate:  [58-90] 89 (09/06 2200) Resp:  [10-20] 20 (09/06 2200) BP: (78-141)/(56-77) 104/61 (09/06 2200) SpO2:  [98 %-100 %] 98 % (09/06 2200) Arterial Line BP: (84-119)/(52-71) 101/57 (09/06 2200) FiO2 (%):  [40 %-50 %] 40 % (09/06 2130)   Intake/Output:   Intake/Output Summary (Last 24 hours) at 07/29/16 2243 Last data filed at 07/29/16 2200  Gross per 24 hour  Intake          2702.87 ml  Output             4050 ml  Net         -1347.13 ml     Physical Exam: General:  Intubated. Sedated HEENT: normal x for ETT and lip/tongue swelling Neck: supple. RIJ swan  Cor: R lateral thoracotomy scar. Regular rate & rhythm. No MR or rub Lungs: clear anteriorly Abdomen: soft, nontender, nondistended. Hypoactive bowel sounds. Extremities: no cyanosis, clubbing, rash, edema Neuro: intubated/sedated  Telemetry: A-paced   Labs: Basic Metabolic Panel:  Recent Labs Lab 07/24/16 1349  07/29/16 1102 07/29/16 1159 07/29/16 1302 07/29/16 1353 07/29/16 1525 07/29/16 2100 07/29/16 2113  NA 135  < > 141 140 140 140 141  --  137  K 4.2  < > 3.9 4.1 4.3 3.5 3.4*  --  4.4  CL 102  < > 99* 101 102 101  --   --  105  CO2 24  --   --   --   --   --   --   --   --   GLUCOSE 100*  < > 99 118* 116* 122* 83  --  106*  BUN 9  < > 9 10 10 9   --   --  7  CREATININE 0.49  < > 0.20* 0.20* 0.20* <0.20*  --  0.45 0.30*  CALCIUM 9.3  --   --   --   --   --   --   --   --   MG  --   --   --   --   --   --    --  2.8*  --   < > = values in this interval not displayed.  Liver Function Tests:  Recent Labs Lab 07/24/16 1349  AST 25  ALT 22  ALKPHOS 51  BILITOT 0.8  PROT 6.9  ALBUMIN 4.2   No results for input(s): LIPASE, AMYLASE in the last 168 hours. No results for input(s): AMMONIA in the last 168 hours.  CBC:  Recent Labs Lab 07/24/16 1349  07/29/16 1245  07/29/16 1353 07/29/16 1524 07/29/16 1525 07/29/16 2100 07/29/16 2113  WBC 7.3  --   --   --   --  13.3*  --  18.0*  --   HGB 12.7  < > 7.0*  < > 7.8* 9.8* 9.9* 9.7* 8.8*  HCT 38.9  < > 20.5*  < > 23.0* 30.3* 29.0* 29.0* 26.0*  MCV 93.5  --   --   --   --  92.4  --  92.9  --   PLT 181  --  88*  --   --  97*  --  90*  --   < > = values in this interval not displayed.  Cardiac Enzymes: No results for input(s): CKTOTAL, CKMB, CKMBINDEX, TROPONINI in the last 168 hours.  BNP: BNP (last 3 results) No results for input(s): BNP in the last 8760 hours.  ProBNP (last 3 results) No results for input(s): PROBNP in the last 8760 hours.    Other results:  Imaging: Dg Chest Port 1 View  Result Date: 07/29/2016 CLINICAL DATA:  Status post mitral valve repair EXAM: PORTABLE CHEST 1 VIEW COMPARISON:  07/24/2016 FINDINGS: Cardiac shadow is stable. A Swan-Ganz catheter is noted in the right pulmonary artery. An endotracheal tube and nasogastric catheter are seen in satisfactory position. Right thoracostomy catheter extends to the right apex. A right pericardial drain extends towards the cardiac apex. No pneumothorax is seen. No focal infiltrate is noted. IMPRESSION: Postoperative changes with tubes and lines as described above. No pneumothorax or focal infiltrate is seen. Electronically Signed   By: Inez Catalina M.D.   On: 07/29/2016 15:52     Medications:     Scheduled Medications: . [START ON 07/30/2016] acetaminophen  1,000 mg Oral Q6H   Or  . [START ON 07/30/2016] acetaminophen (TYLENOL) oral liquid 160 mg/5 mL  1,000 mg Per  Tube Q6H  . [START ON 07/30/2016] aspirin EC  325 mg Oral Daily   Or  . [START ON 07/30/2016] aspirin  324 mg Per Tube Daily  . [START ON 07/30/2016] bisacodyl  10 mg Oral Daily   Or  . [START ON 07/30/2016] bisacodyl  10 mg Rectal Daily  . [START ON 07/30/2016] cefUROXime (ZINACEF)  IV  1.5 g Intravenous Q12H  . [START ON 07/30/2016] docusate sodium  200 mg Oral Daily  . insulin aspart  0-24 Units Subcutaneous Q4H  . mouth rinse  15 mL Mouth Rinse BID  . metoprolol tartrate  12.5 mg Oral BID   Or  . metoprolol tartrate  12.5 mg Per Tube BID  . [START ON 07/31/2016] pantoprazole  40 mg Oral Daily  . [START ON 07/30/2016] sodium chloride flush  3 mL Intravenous Q12H    Infusions: . sodium chloride 20 mL/hr at 07/29/16 2000  . sodium chloride 100 mL/hr at 07/29/16 2000  . [START ON 07/30/2016] sodium chloride    . sodium chloride 20 mL/hr at 07/29/16 1600  . dexmedetomidine Stopped (07/29/16 1745)  . lactated ringers Stopped (07/29/16 1500)  . lactated ringers 20 mL/hr at 07/29/16 2000  . nitroGLYCERIN    . phenylephrine (NEO-SYNEPHRINE) Adult infusion 10 mcg/min (07/29/16 2000)    PRN Medications: sodium chloride, albumin human, lactated ringers, metoprolol, midazolam, morphine injection, morphine injection, ondansetron (ZOFRAN) IV, oxyCODONE, [START ON 07/30/2016] sodium chloride flush, traMADol   Assessment:   1. MVP with severe MR S/P Minimally Invasive Mitral Valve Repair 9/6 2. Hyperlipidemia 3. H/O L Breast Cancer  Plan/Discussion:    She is doing well immediately post-op MV repair. She is on low dose Neo. Cardiac output on low side but making index. She is making good urine and seems well perfused. Renal function and rhythm stable  Hopefully can be extubated soon as lip and tongue swelling resolve. Will hold b-blocker for now.   We will follow.    Length of Stay: 0 Glori Bickers MD 07/29/2016, 10:43 PM  Advanced Heart Failure Team Pager [phone] (M-F; 7a -  4p)  Please  contact [facility] for night-coverage after hours (4p -7a ) and weekends on [email or web address]

## 2016-07-29 NOTE — Transfer of Care (Signed)
Immediate Anesthesia Transfer of Care Note  Patient: Madgie Andris  Procedure(s) Performed: Procedure(s): MINIMALLY INVASIVE MITRAL VALVE REPAIR (MVR) USING 34 MEMO  ANNULOPLASTY RING. (Right) TRANSESOPHAGEAL ECHOCARDIOGRAM (TEE) (N/A)  Patient Location: SICU  Anesthesia Type:General  Level of Consciousness: sedated, unresponsive and Patient remains intubated per anesthesia plan  Airway & Oxygen Therapy: Patient remains intubated per anesthesia plan and Patient placed on Ventilator (see vital sign flow sheet for setting)  Post-op Assessment: Report given to RN and Post -op Vital signs reviewed and stable  Post vital signs: Reviewed and stable  Last Vitals:  Vitals:   07/29/16 0712 07/29/16 1529  BP: (!) 141/64 (!) 89/56  Pulse: (!) 58   Resp: 18   Temp: 36.6 C     Last Pain: There were no vitals filed for this visit.       Complications: No apparent anesthesia complications

## 2016-07-29 NOTE — Anesthesia Procedure Notes (Signed)
Procedure Name: Intubation Date/Time: 07/29/2016 8:55 AM Performed by: Eligha Bridegroom Pre-anesthesia Checklist: Patient identified, Emergency Drugs available, Suction available, Timeout performed and Patient being monitored Patient Re-evaluated:Patient Re-evaluated prior to inductionOxygen Delivery Method: Circle system utilized Preoxygenation: Pre-oxygenation with 100% oxygen Intubation Type: IV induction Ventilation: Mask ventilation without difficulty and Oral airway inserted - appropriate to patient size Laryngoscope Size: Mac and 3 Grade View: Grade I Endobronchial tube: Left and Double lumen EBT and 35 Fr Number of attempts: 1 Airway Equipment and Method: Stylet Placement Confirmation: ETT inserted through vocal cords under direct vision Tube secured with: Tape Dental Injury: Teeth and Oropharynx as per pre-operative assessment

## 2016-07-29 NOTE — Progress Notes (Signed)
      [address]       [place] [address]             [phone]    Intubated and sedated   S/p mitral repair  BP 99/69   Pulse 80   Temp 97.2 F (36.2 C)   Resp 12   SpO2 100%   PA= 30/17 CI= 2.0  Intake/Output Summary (Last 24 hours) at 07/29/16 1737 Last data filed at 07/29/16 1717  Gross per 24 hour  Intake          1782.87 ml  Output             2800 ml  Net         -1017.13 ml   K= 3.4 initially- treated Hgb= 29  Doing well early postop  Remo Lipps C. Roxan Hockey, MD [facility] [phone]

## 2016-07-29 NOTE — Brief Op Note (Addendum)
07/29/2016  1:43 PM  PATIENT:  Laura Galvan  70 y.o. female  PRE-OPERATIVE DIAGNOSIS:  MITRAL REGURGITATION  POST-OPERATIVE DIAGNOSIS:  MITRAL REGURGITATION  PROCEDURE:  Procedure(s): MINIMALLY INVASIVE MITRAL VALVE REPAIR (MVR) USING 34 MEMO  ANNULOPLASTY RING AND RIGHT ATRIUM REAPIR USING 4CM X 7CM CORMATRIX PATCH. 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

## 2016-07-29 NOTE — Anesthesia Postprocedure Evaluation (Signed)
Anesthesia Post Note  Patient: Laura Galvan  Procedure(s) Performed: Procedure(s) (LRB): MINIMALLY INVASIVE MITRAL VALVE REPAIR (MVR) USING 34 MEMO  ANNULOPLASTY RING. (Right) TRANSESOPHAGEAL ECHOCARDIOGRAM (TEE) (N/A)  Patient location during evaluation: SICU Anesthesia Type: General Level of consciousness: sedated Pain management: pain level controlled Vital Signs Assessment: post-procedure vital signs reviewed and stable Respiratory status: patient remains intubated per anesthesia plan Cardiovascular status: stable Anesthetic complications: no    Last Vitals:  Vitals:   07/29/16 1715 07/29/16 1730  BP: 100/74 99/69  Pulse: 80 80  Resp: 12 12  Temp: 36.1 C 36.2 C    Last Pain: There were no vitals filed for this visit.               Effie Berkshire

## 2016-07-30 ENCOUNTER — Inpatient Hospital Stay (HOSPITAL_COMMUNITY): Payer: Medicare Other

## 2016-07-30 LAB — BASIC METABOLIC PANEL
Anion gap: 5 (ref 5–15)
BUN: 5 mg/dL — AB (ref 6–20)
CHLORIDE: 104 mmol/L (ref 101–111)
CO2: 24 mmol/L (ref 22–32)
CREATININE: 0.43 mg/dL — AB (ref 0.44–1.00)
Calcium: 6.8 mg/dL — ABNORMAL LOW (ref 8.9–10.3)
GFR calc Af Amer: >60 mL/min (ref >60)
GFR calc non Af Amer: >60 mL/min (ref >60)
GLUCOSE: 129 mg/dL — AB (ref 65–99)
POTASSIUM: 3.5 mmol/L (ref 3.5–5.1)
SODIUM: 133 mmol/L — AB (ref 135–145)

## 2016-07-30 LAB — GLUCOSE, CAPILLARY
GLUCOSE-CAPILLARY: 105 mg/dL — AB (ref 65–99)
GLUCOSE-CAPILLARY: 152 mg/dL — AB (ref 65–99)
GLUCOSE-CAPILLARY: 159 mg/dL — AB (ref 65–99)
GLUCOSE-CAPILLARY: 98 mg/dL (ref 65–99)
Glucose-Capillary: 121 mg/dL — ABNORMAL HIGH (ref 65–99)
Glucose-Capillary: 110 mg/dL — ABNORMAL HIGH (ref 65–99)

## 2016-07-30 LAB — POCT I-STAT 3, ART BLOOD GAS (G3+)
Acid-base deficit: 5 mmol/L — ABNORMAL HIGH (ref 0.0–2.0)
Bicarbonate: 22 mmol/L (ref 20.0–28.0)
O2 Saturation: 99 %
Patient temperature: 98.4
TCO2: 23 mmol/L (ref 0–100)
pCO2 arterial: 46.7 mmHg (ref 32.0–48.0)
pH, Arterial: 7.28 — ABNORMAL LOW (ref 7.350–7.450)
pO2, Arterial: 135 mmHg — ABNORMAL HIGH (ref 83.0–108.0)

## 2016-07-30 LAB — CBC
HCT: 29.3 % — ABNORMAL LOW (ref 36.0–46.0)
HEMATOCRIT: 29.6 % — AB (ref 36.0–46.0)
HEMOGLOBIN: 9.5 g/dL — AB (ref 12.0–15.0)
Hemoglobin: 9.6 g/dL — ABNORMAL LOW (ref 12.0–15.0)
MCH: 30.2 pg (ref 26.0–34.0)
MCH: 30.3 pg (ref 26.0–34.0)
MCHC: 32.4 g/dL (ref 30.0–36.0)
MCHC: 32.4 g/dL (ref 30.0–36.0)
MCV: 93 fL (ref 78.0–100.0)
MCV: 93.4 fL (ref 78.0–100.0)
PLATELETS: 100 10*3/uL — AB (ref 150–400)
PLATELETS: 87 10*3/uL — AB (ref 150–400)
RBC: 3.15 MIL/uL — ABNORMAL LOW (ref 3.87–5.11)
RBC: 3.17 MIL/uL — ABNORMAL LOW (ref 3.87–5.11)
RDW: 13.6 % (ref 11.5–15.5)
RDW: 13.9 % (ref 11.5–15.5)
WBC: 21.6 10*3/uL — ABNORMAL HIGH (ref 4.0–10.5)
WBC: 22.1 10*3/uL — AB (ref 4.0–10.5)

## 2016-07-30 LAB — POCT I-STAT, CHEM 8
BUN: 12 mg/dL (ref 6–20)
CALCIUM ION: 1.09 mmol/L — AB (ref 1.15–1.40)
CHLORIDE: 95 mmol/L — AB (ref 101–111)
Creatinine, Ser: 0.7 mg/dL (ref 0.44–1.00)
GLUCOSE: 163 mg/dL — AB (ref 65–99)
HCT: 27 % — ABNORMAL LOW (ref 36.0–46.0)
Hemoglobin: 9.2 g/dL — ABNORMAL LOW (ref 12.0–15.0)
Potassium: 5 mmol/L (ref 3.5–5.1)
Sodium: 131 mmol/L — ABNORMAL LOW (ref 135–145)
TCO2: 23 mmol/L (ref 0–100)

## 2016-07-30 LAB — PROTIME-INR

## 2016-07-30 LAB — CARBOXYHEMOGLOBIN
CARBOXYHEMOGLOBIN: 1.5 % (ref 0.5–1.5)
Methemoglobin: 0.7 % (ref 0.0–1.5)
O2 SAT: 75.2 %
TOTAL HEMOGLOBIN: 8.5 g/dL — AB (ref 12.0–16.0)

## 2016-07-30 LAB — CREATININE, SERUM: Creatinine, Ser: 0.71 mg/dL (ref 0.44–1.00)

## 2016-07-30 LAB — MAGNESIUM
MAGNESIUM: 1.9 mg/dL (ref 1.7–2.4)
MAGNESIUM: 2 mg/dL (ref 1.7–2.4)

## 2016-07-30 MED ORDER — POTASSIUM CHLORIDE 10 MEQ/50ML IV SOLN
10.0000 meq | INTRAVENOUS | Status: AC
  Administered 2016-07-30 (×2): 10 meq via INTRAVENOUS

## 2016-07-30 MED ORDER — POTASSIUM CHLORIDE 10 MEQ/50ML IV SOLN
10.0000 meq | INTRAVENOUS | Status: DC | PRN
  Administered 2016-07-30 (×2): 10 meq via INTRAVENOUS

## 2016-07-30 MED ORDER — WARFARIN SODIUM 2.5 MG PO TABS
2.5000 mg | ORAL_TABLET | Freq: Every day | ORAL | Status: DC
  Administered 2016-07-30 – 2016-08-02 (×4): 2.5 mg via ORAL
  Filled 2016-07-30 (×4): qty 1

## 2016-07-30 MED ORDER — FUROSEMIDE 10 MG/ML IJ SOLN
20.0000 mg | Freq: Once | INTRAMUSCULAR | Status: AC
Start: 2016-07-30 — End: 2016-07-30
  Administered 2016-07-30: 20 mg via INTRAVENOUS
  Filled 2016-07-30: qty 2

## 2016-07-30 MED ORDER — WARFARIN - PHYSICIAN DOSING INPATIENT
Freq: Every day | Status: DC
Start: 2016-07-30 — End: 2016-08-05
  Administered 2016-08-03 – 2016-08-04 (×2)

## 2016-07-30 MED ORDER — METOPROLOL TARTRATE 25 MG/10 ML ORAL SUSPENSION
12.5000 mg | Freq: Two times a day (BID) | ORAL | Status: DC
  Administered 2016-07-31 (×2): 12.5 mg via ORAL
  Filled 2016-07-30: qty 5
  Filled 2016-07-30: qty 10

## 2016-07-30 MED ORDER — METOCLOPRAMIDE HCL 5 MG/ML IJ SOLN
10.0000 mg | Freq: Four times a day (QID) | INTRAMUSCULAR | Status: AC
  Administered 2016-07-30 – 2016-07-31 (×4): 10 mg via INTRAVENOUS
  Filled 2016-07-30 (×3): qty 2

## 2016-07-30 MED FILL — Electrolyte-R (PH 7.4) Solution: INTRAVENOUS | Qty: 4000 | Status: AC

## 2016-07-30 MED FILL — Sodium Chloride IV Soln 0.9%: INTRAVENOUS | Qty: 2000 | Status: AC

## 2016-07-30 MED FILL — Mannitol IV Soln 20%: INTRAVENOUS | Qty: 500 | Status: AC

## 2016-07-30 MED FILL — Sodium Bicarbonate IV Soln 8.4%: INTRAVENOUS | Qty: 50 | Status: AC

## 2016-07-30 MED FILL — Heparin Sodium (Porcine) Inj 1000 Unit/ML: INTRAMUSCULAR | Qty: 10 | Status: AC

## 2016-07-30 MED FILL — Lidocaine HCl IV Inj 20 MG/ML: INTRAVENOUS | Qty: 5 | Status: AC

## 2016-07-30 NOTE — Progress Notes (Signed)
Patient ID: Laura Galvan, female   DOB: [birth year], 70 y.o.   MRN: [id number]  SICU Evening Rounds:  Hemodynamically stable in sinus rhythm. Neo weaned off.  Urine output ok.  Chest tube output low.  BMET    Component Value Date/Time   NA 131 (L) 07/30/2016 1701   K 5.0 07/30/2016 1701   CL 95 (L) 07/30/2016 1701   CO2 24 07/30/2016 0412   GLUCOSE 163 (H) 07/30/2016 1701   BUN 12 07/30/2016 1701   CREATININE 0.70 07/30/2016 1701   CALCIUM 6.8 (L) 07/30/2016 0412   GFRNONAA >60 07/30/2016 1700   GFRAA >60 07/30/2016 1700    CBC    Component Value Date/Time   WBC 22.1 (H) 07/30/2016 1700   RBC 3.17 (L) 07/30/2016 1700   HGB 9.2 (L) 07/30/2016 1701   HCT 27.0 (L) 07/30/2016 1701   PLT 87 (L) 07/30/2016 1700   MCV 93.4 07/30/2016 1700   MCH 30.3 07/30/2016 1700   MCHC 32.4 07/30/2016 1700   RDW 13.9 07/30/2016 1700    A/P: stable day. Continue present course.

## 2016-07-30 NOTE — Progress Notes (Signed)
[address]       [place],Laura Galvan [address]             [phone]        CARDIOTHORACIC SURGERY PROGRESS NOTE   R1 Day Post-Op Procedure(s) (LRB): MINIMALLY INVASIVE MITRAL VALVE REPAIR (MVR) USING 34 MEMO  ANNULOPLASTY RING. (Right) TRANSESOPHAGEAL ECHOCARDIOGRAM (TEE) (N/A)  Subjective: Looks good.  Some nausea overnight.  No pain.  No SOB  Objective: Vital signs: BP Readings from Last 1 Encounters:  07/30/16 (!) 93/57   Pulse Readings from Last 1 Encounters:  07/30/16 89   Resp Readings from Last 1 Encounters:  07/30/16 18   Temp Readings from Last 1 Encounters:  07/30/16 98.6 F (37 C) (Oral)    Hemodynamics: PAP: (19-84)/(10-55) 19/10 CO:  [1.9 L/min-4.9 L/min] 4.9 L/min CI:  [1.3 L/min/m2-3.3 L/min/m2] 3.3 L/min/m2  Physical Exam:  Rhythm:   sinus  Breath sounds: clear  Heart sounds:  RRR w/out murmur  Incisions:  Dressings dry, intact  Abdomen:  Soft, non-distended, non-tender  Extremities:  Warm, well-perfused  Chest tubes:  Low volume thin serosanguinous output, no air leak    Intake/Output from previous day: 09/06 0701 - 09/07 0700 In: 3737.9 [P.O.:90; I.V.:2029.9; Blood:168; IV Piggyback:1450] Out: [id number] [Urine:4060; Blood:975; Chest Tube:620] Intake/Output this shift: No intake/output data recorded.  Lab Results:  CBC: Recent Labs  07/29/16 2100 07/29/16 2113 07/30/16 0412  WBC 18.0*  --  21.6*  HGB 9.7* 8.8* 9.5*  HCT 29.0* 26.0* 29.3*  PLT 90*  --  100*    BMET:  Recent Labs  07/29/16 2113 07/30/16 0412  NA 137 133*  K 4.4 3.5  CL 105 104  CO2  --  24  GLUCOSE 106* 129*  BUN 7 5*  CREATININE 0.30* 0.43*  CALCIUM  --  6.8*     PT/INR:   Recent Labs  07/29/16 1524  LABPROT 18.5*  INR 1.53    CBG (last 3)   Recent Labs  07/29/16 1914 07/29/16 2346 07/30/16 0340  GLUCAP 83 129* 121*    ABG    Component Value Date/Time   PHART 7.280 (L) 07/30/2016 0011   PCO2ART 46.7 07/30/2016 0011   PO2ART 135.0 (H) 07/30/2016 0011   HCO3 22.0 07/30/2016 0011   TCO2 23 07/30/2016 0011   ACIDBASEDEF 5.0 (H) 07/30/2016 0011   O2SAT 75.2 07/30/2016 0405    CXR: PORTABLE CHEST 1 VIEW  COMPARISON:  07/29/2016.  FINDINGS: Interim extubation and removal of NG tube. Swan-Ganz catheter, right chest tube, right mediastinal drainage catheter in stable position in stable position. Prior cardiac valve replacement. Mild cardiomegaly. Bibasilar pulmonary infiltrates and/or pulmonary edema noted. Small bilateral pleural effusions. No pneumothorax.  IMPRESSION: 1. Interim extubation removal of NG tube. Remaining tubes including right chest tube and right mediastinal drainage catheter stable position. No pneumothorax.  2. Prior cardiac valve replacement. Cardiomegaly with bilateral basilar pulmonary alveolar infiltrates and/or pulmonary edema. Small bilateral pleural effusions.   Electronically Signed   By: Marcello Moores  Register   On: 07/30/2016 07:37\   EKG: NSR w/out acute ischemic changes     Assessment/Plan: S/P Procedure(s) (LRB): MINIMALLY INVASIVE MITRAL VALVE REPAIR (MVR) USING 34 MEMO  ANNULOPLASTY RING. (Right) TRANSESOPHAGEAL ECHOCARDIOGRAM (TEE) (N/A)  Doing well POD1 Maintaining NSR w/ stable hemodynamics on very low dose Neo for BP support Breathing comfortably w/ O2 sats 100% on 2 L/min Expected post op acute blood loss anemia, Hgb 9.5 stable this morning Expected post op  atelectasis, mild Expected post op volume excess, diuresing spontaneously Post op thrombocytopenia, platelet count up to 100k this morning   Mobilize  D/C lines  Wean Neo as tolerated  Supplement potassium  Start coumadin slowly   Rexene Alberts, MD 07/30/2016 7:53 AM

## 2016-07-30 NOTE — Care Management Note (Signed)
Case Management Note  Patient Details  Name: Laura Galvan MRN: [id number] Date of Birth: 1946-06-19  Subjective/Objective:        S/p MINI MVR             Action/Plan:  PTA from home in [place] with husband (husband has some mobility issues).  Post discharge pt will stay in [place] with son and daughter-in-law and later transition back to home in [place].  Family has already arranged for a caregiver in the home when needed - however request for [facility] when she returns to [place],  [place] will ask for [facility] order and will continue to follow for discharge needs.     Expected Discharge Date:                  Expected Discharge Plan:  [place]  In-House Referral:     Discharge planning Services  CM Consult  Post Acute Care Choice:    Choice offered to:     DME Arranged:    DME Agency:     HH Arranged:    [place] Agency:     Status of Service:  In process, will continue to follow  If discussed at Long Length of Stay Meetings, dates discussed:    Additional Comments:  Maryclare Labrador, RN 07/30/2016, 10:07 AM

## 2016-07-30 NOTE — Progress Notes (Signed)
Advanced Heart Failure Rounding Note   Subjective:     Laura Galvan is a 70 year old female with history of mitral valve prolapse and mitral regurgitation, hyperlipidemia, and breast cancer status post left mastectomy with reconstruction.   Underwent minimally invasive MV repair 9/6. Extubated last night. Feels fine. Diuresing well (200-400/hr). K low and being suppedc  Denies dyspnea. On neo at 10. Not passing gas yet. Has not taken any pain meds. Mild nausea resolved with Zofran this am,   PA 21/12  CO/CI 4.9/3.3    Objective:   Weight Range:  Vital Signs:   Temp:  [93.6 F (34.2 C)-99.7 F (37.6 C)] 99.7 F (37.6 C) (09/07 0600) Pulse Rate:  [58-90] 90 (09/07 0600) Resp:  [10-20] 18 (09/07 0600) BP: (78-141)/(56-82) 102/60 (09/07 0600) SpO2:  [98 %-100 %] 100 % (09/07 0600) Arterial Line BP: (84-122)/(52-71) 122/55 (09/07 0600) FiO2 (%):  [40 %-50 %] 40 % (09/06 2130) Weight:  [48.3 kg (106 lb 7.7 oz)] 48.3 kg (106 lb 7.7 oz) (09/07 0500)    Weight change: Filed Weights   07/30/16 0500  Weight: 48.3 kg (106 lb 7.7 oz)    Intake/Output:   Intake/Output Summary (Last 24 hours) at 07/30/16 0631 Last data filed at 07/30/16 0600  Gross per 24 hour  Intake          3660.37 ml  Output             5525 ml  Net         -1864.63 ml     Physical Exam: General:  Extubated. Looks good HEENT: normal Neck: supple. RIJ swan  Cor: R lateral thoracotomy scar. Regular rate & rhythm. No MR or rub. 2 chest tubes  Lungs: mild rhonchi Abdomen: soft, nontender, nondistended. Hypoactive bowel sounds. Extremities: no cyanosis, clubbing, rash, edema Neuro:nonfocal  Telemetry: Apaced 90  NSR at 60 underneath   Labs: Basic Metabolic Panel:  Recent Labs Lab 07/24/16 1349  07/29/16 1159 07/29/16 1302 07/29/16 1353 07/29/16 1525 07/29/16 2100 07/29/16 2113 07/30/16 0412  NA 135  < > 140 140 140 141  --  137 133*  K 4.2  < > 4.1 4.3 3.5 3.4*  --  4.4 3.5  CL 102   < > 101 102 101  --   --  105 104  CO2 24  --   --   --   --   --   --   --  24  GLUCOSE 100*  < > 118* 116* 122* 83  --  106* 129*  BUN 9  < > 10 10 9   --   --  7 5*  CREATININE 0.49  < > 0.20* 0.20* <0.20*  --  0.45 0.30* 0.43*  CALCIUM 9.3  --   --   --   --   --   --   --  6.8*  MG  --   --   --   --   --   --  2.8*  --  1.9  < > = values in this interval not displayed.  Liver Function Tests:  Recent Labs Lab 07/24/16 1349  AST 25  ALT 22  ALKPHOS 51  BILITOT 0.8  PROT 6.9  ALBUMIN 4.2   No results for input(s): LIPASE, AMYLASE in the last 168 hours. No results for input(s): AMMONIA in the last 168 hours.  CBC:  Recent Labs Lab 07/24/16 1349  07/29/16 1245  07/29/16 1524 07/29/16 1525 07/29/16  2100 07/29/16 2113 07/30/16 0412  WBC 7.3  --   --   --  13.3*  --  18.0*  --  21.6*  HGB 12.7  < > 7.0*  < > 9.8* 9.9* 9.7* 8.8* 9.5*  HCT 38.9  < > 20.5*  < > 30.3* 29.0* 29.0* 26.0* 29.3*  MCV 93.5  --   --   --  92.4  --  92.9  --  93.0  PLT 181  --  88*  --  97*  --  90*  --  100*  < > = values in this interval not displayed.  Cardiac Enzymes: No results for input(s): CKTOTAL, CKMB, CKMBINDEX, TROPONINI in the last 168 hours.  BNP: BNP (last 3 results) No results for input(s): BNP in the last 8760 hours.  ProBNP (last 3 results) No results for input(s): PROBNP in the last 8760 hours.    Other results:  Imaging: Dg Chest Port 1 View  Result Date: 07/29/2016 CLINICAL DATA:  Status post mitral valve repair EXAM: PORTABLE CHEST 1 VIEW COMPARISON:  07/24/2016 FINDINGS: Cardiac shadow is stable. A Swan-Ganz catheter is noted in the right pulmonary artery. An endotracheal tube and nasogastric catheter are seen in satisfactory position. Right thoracostomy catheter extends to the right apex. A right pericardial drain extends towards the cardiac apex. No pneumothorax is seen. No focal infiltrate is noted. IMPRESSION: Postoperative changes with tubes and lines as  described above. No pneumothorax or focal infiltrate is seen. Electronically Signed   By: Inez Catalina M.D.   On: 07/29/2016 15:52      Medications:     Scheduled Medications: . acetaminophen  1,000 mg Oral Q6H   Or  . acetaminophen (TYLENOL) oral liquid 160 mg/5 mL  1,000 mg Per Tube Q6H  . aspirin EC  325 mg Oral Daily   Or  . aspirin  324 mg Per Tube Daily  . bisacodyl  10 mg Oral Daily   Or  . bisacodyl  10 mg Rectal Daily  . cefUROXime (ZINACEF)  IV  1.5 g Intravenous Q12H  . docusate sodium  200 mg Oral Daily  . insulin aspart  0-24 Units Subcutaneous Q4H  . mouth rinse  15 mL Mouth Rinse BID  . metoprolol tartrate  12.5 mg Per Tube BID  . [START ON 07/31/2016] pantoprazole  40 mg Oral Daily  . sodium chloride flush  3 mL Intravenous Q12H     Infusions: . sodium chloride 20 mL/hr at 07/30/16 0400  . sodium chloride    . sodium chloride 20 mL/hr at 07/29/16 1600  . dexmedetomidine Stopped (07/29/16 1745)  . lactated ringers Stopped (07/29/16 1500)  . lactated ringers 20 mL/hr at 07/30/16 0400  . nitroGLYCERIN    . phenylephrine (NEO-SYNEPHRINE) Adult infusion 10 mcg/min (07/30/16 0600)     PRN Medications:  sodium chloride, albumin human, lactated ringers, metoprolol, midazolam, morphine injection, ondansetron (ZOFRAN) IV, oxyCODONE, potassium chloride, sodium chloride flush, traMADol   Assessment:   1. MVP with severe MR S/P Minimally Invasive Mitral Valve Repair 9/6 2. Hypokalemia 3. H/O L Breast Cancer  Plan/Discussion:    She is doing very well POD #1 MV repair. Extubated. Volume status looks good. Cardiac output much improved. Diuresing briskly without lasix. She is on low dose Neo for BP support - wean as tolerated. CTs draining 20-30/hr - now tapering off.   Appreciate TCTS's excellent care.    Length of Stay: 1  Glori Bickers MD 07/30/2016, 6:31 AM  Advanced Heart Failure Team Pager [phone] (M-F; [place])  Please contact [facility]  for night-coverage after hours (4p -7a ) and weekends on [email or web address]

## 2016-07-31 ENCOUNTER — Inpatient Hospital Stay (HOSPITAL_COMMUNITY): Payer: Medicare Other

## 2016-07-31 ENCOUNTER — Encounter (HOSPITAL_COMMUNITY): Payer: Self-pay | Admitting: Thoracic Surgery (Cardiothoracic Vascular Surgery)

## 2016-07-31 DIAGNOSIS — Z9889 Other specified postprocedural states: Secondary | ICD-10-CM

## 2016-07-31 LAB — CBC
HEMATOCRIT: 28 % — AB (ref 36.0–46.0)
Hemoglobin: 9 g/dL — ABNORMAL LOW (ref 12.0–15.0)
MCH: 30 pg (ref 26.0–34.0)
MCHC: 32.1 g/dL (ref 30.0–36.0)
MCV: 93.3 fL (ref 78.0–100.0)
PLATELETS: 84 10*3/uL — AB (ref 150–400)
RBC: 3 MIL/uL — ABNORMAL LOW (ref 3.87–5.11)
RDW: 13.9 % (ref 11.5–15.5)
WBC: 18 10*3/uL — AB (ref 4.0–10.5)

## 2016-07-31 LAB — BASIC METABOLIC PANEL
Anion gap: 4 — ABNORMAL LOW (ref 5–15)
BUN: 14 mg/dL (ref 6–20)
CHLORIDE: 98 mmol/L — AB (ref 101–111)
CO2: 25 mmol/L (ref 22–32)
CREATININE: 0.63 mg/dL (ref 0.44–1.00)
Calcium: 7.8 mg/dL — ABNORMAL LOW (ref 8.9–10.3)
GFR calc Af Amer: >60 mL/min (ref >60)
GFR calc non Af Amer: >60 mL/min (ref >60)
GLUCOSE: 114 mg/dL — AB (ref 65–99)
POTASSIUM: 4.8 mmol/L (ref 3.5–5.1)
Sodium: 127 mmol/L — ABNORMAL LOW (ref 135–145)

## 2016-07-31 LAB — PROTIME-INR
INR: 1.34
Prothrombin Time: 16.7 seconds — ABNORMAL HIGH (ref 11.4–15.2)

## 2016-07-31 LAB — GLUCOSE, CAPILLARY
GLUCOSE-CAPILLARY: 107 mg/dL — AB (ref 65–99)
GLUCOSE-CAPILLARY: 98 mg/dL (ref 65–99)

## 2016-07-31 MED ORDER — FUROSEMIDE 40 MG PO TABS: 40.0000 mg | ORAL_TABLET | Freq: Every day | ORAL | Status: DC

## 2016-07-31 MED ORDER — MOVING RIGHT ALONG BOOK
Freq: Once | Status: AC
  Administered 2016-07-31: 08:00:00
  Filled 2016-07-31: qty 1

## 2016-07-31 MED ORDER — SODIUM CHLORIDE 0.9% FLUSH
3.0000 mL | Freq: Two times a day (BID) | INTRAVENOUS | Status: DC
  Administered 2016-08-01 – 2016-08-05 (×7): 3 mL via INTRAVENOUS

## 2016-07-31 MED ORDER — SODIUM CHLORIDE 0.9% FLUSH: 3.0000 mL | INTRAVENOUS | Status: DC | PRN

## 2016-07-31 MED ORDER — FUROSEMIDE 10 MG/ML IJ SOLN
20.0000 mg | Freq: Four times a day (QID) | INTRAMUSCULAR | Status: AC
Start: 2016-07-31 — End: 2016-07-31
  Administered 2016-07-31 (×3): 20 mg via INTRAVENOUS
  Filled 2016-07-31 (×3): qty 2

## 2016-07-31 MED ORDER — SODIUM CHLORIDE 0.9 % IV SOLN: 250.0000 mL | INTRAVENOUS | Status: DC | PRN

## 2016-07-31 MED ORDER — ATORVASTATIN CALCIUM 10 MG PO TABS: 10.0000 mg | ORAL_TABLET | Freq: Every day | ORAL | Status: DC

## 2016-07-31 MED ORDER — POTASSIUM CHLORIDE CRYS ER 20 MEQ PO TBCR
20.0000 meq | EXTENDED_RELEASE_TABLET | Freq: Every day | ORAL | Status: DC
  Administered 2016-08-01: 20 meq via ORAL
  Filled 2016-07-31: qty 1

## 2016-07-31 NOTE — Discharge Summary (Signed)
Physician Discharge Summary  Patient ID: Laura Galvan MRN: [id number] DOB/AGE: 02-26-1946 70 y.o.  Admit date: 07/29/2016 Discharge date: 08/05/2016  Admission Diagnoses: 1. Severe mitral regurgitation 2. Mitral valve prolapse  Active Diagnoses:  1. Hyperlipidemia 2. Left breast cancer-s/p mastectomy with reconstruction 3. 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 Anemia 5. Thrombocytopenia  Discharged Condition: good  HPI:  Patient has stage C severe asymptomatic primary mitral regurgitation. I have personally reviewed the patient's recent transesophageal echocardiogram,transthoracic echocardiogram, cardiac catheterization and CT angiograms. 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 There is an obvious flail segment of the posterior leaflet with ruptured chordae tendineae. There is severe mitral regurgitation with a jet of regurgitation that courses anteriorly around the entire left atrium. There is normal left ventricular size and systolic function. No other significant abnormalities are noted. Diagnostic cardiac catheterization reveals no significant coronary artery disease and normal right-sided pressures. CT angiography reveals no significant findings including no contraindications to peripheral arterial cannulation for surgery.  The patient is otherwise healthy and remains active physically. She specifically denies any symptoms of exertional shortness of breath, chest discomfort, or worsening fatigue. Options include continued watchful waiting with close follow-up versus elective mitral valve repair. Based upon review of the patient's echocardiograms I feel there is a greater than 95% likelihood of successful and durable mitral valve repair. Operative risks should be very low. The patient appears tobe a good candidate for  minimally invasive approach for surgery.   Hospital Course:  Skin McClean underwent a minimally invasive mitral valve repair with Dr. Ivor Reining on 07/29/2016. She tolerated the procedure well and was transferred to ICU. She was extubated in a timely manner. Postop day 1 and lines were discontinued. She remains on very low-dose pressor support. She is on 2 L nasal cannula with 100% oxygen saturation. She was later weaned to room air. We initiated a diuretic regimen for fluid overload. She has some postop thrombocytopenia, however her platelet count is trending up. Her last platelet count was 106,000. She will be transferred to the telemetry floor. We initiated anticoagulation POD 2 with Coumadin. PT and INR were monitored daily. Her last INR was up to 1.65 and she was on Coumadin 4 mg. Per Dr. Roxy Manns, she will be discharged on Coumadin 5 mg daily.  We will ask the SNF to please draw the PT and INR on Friday 08/07/2016 and call or fax results to Dr. Claris Gladden office. She continues to progress on the telemetry floor.  Her chest tubes were removed on 08/03/2016. Follow up chest x ray showed a stable,small right apical pneumothorax, minor subcutaneous emphysema. She is tolerating a diet and has had a bowel movement. She is ambulating on room air. Epicardial pacing wires have already been removed. We will ask the SNF to please remove the chest tube sutures on 08/14/2016.She is felt surgically stable for discharge to SNF today.  We as the SNF to please do the following: 1. Please obtain vital signs at least one time daily 2.Please weigh the patient daily. If he or she continues to gain weight or develops lower extremity edema, contact the office at [phone]. 3. Ambulate patient at least three times daily and please use sternal precautions. Consults: Heart Failure, Cardiology  Significant Diagnostic Studies: CLINICAL DATA:  Right pneumothorax. Status post mitral valve repair.  EXAM: CHEST  2  VIEW  COMPARISON:  Chest radiograph  08/04/2016  FINDINGS: Small right apical pneumothorax is unchanged. Prosthetic mitral valve again noted.  Blunting of the right costophrenic angle is unchanged, small pleural effusion versus pleural scarring. There is no focal airspace consolidation or pulmonary edema. Cardiomediastinal contours are normal.  IMPRESSION: Unchanged right apical pneumothorax.   Electronically Signed   By: Ulyses Jarred M.D.   On: 08/05/2016 05:59  Treatments:   Date of Procedure:                                    07/29/2016  Preoperative Diagnosis:                Severe Mitral Regurgitation  Postoperative Diagnosis:              Same  Procedure:                  Minimally-Invasive Mitral Valve Repair                       Complex valvuloplasty including quadrangular resection of posterior leaflet x2                       Sliding leaflet plasty                       Artificial Gore-tex neochord placement x12                       Sorin Memo 3D Ring Annuloplasty (size 44mm, catalog # [id number], serial # [id number])                         Surgeon:                  Valentina Gu. Roxy Manns, MD  Assistant:                 Nicholes Rough, PA-C  Anesthesia:              Crissie Sickles. 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 rate 20, weight 99 lb (44.9 kg), SpO2 97 %.  Cardiovascular: RRR, no murmurs, gallops, or rubs. Pulmonary: Clear to auscultation bilaterally Abdomen: Soft, non tender, bowel sounds present. Extremities: No lower extremity  edema. Wounds: Clean and dry.  No erythema or signs of infection.   Disposition: 01-Home or Self Care  Discharge Instructions    Amb Referral to Cardiac Rehabilitation    Complete by:  As directed    Referring to CRP 2 in [place]   Diagnosis:  Valve Repair   Valve:  Mitral       Medication List    STOP taking these medications   amiodarone 200 MG tablet Commonly known as:  PACERONE     TAKE these medications   acetaminophen 325 MG tablet Commonly known as:  TYLENOL Take 2 tablets (650 mg total) by mouth every 6 (six) hours as needed for mild pain, fever or headache.   aspirin 81 MG EC tablet  Take 1 tablet (81 mg total) by mouth daily.   atorvastatin 10 MG tablet Commonly known as:  LIPITOR Take 10 mg by mouth daily at 6 PM.   CALTRATE 600+D PO Take 1 tablet by mouth daily.   cetirizine 10 MG chewable tablet Commonly known as:  ZYRTEC Chew 10 mg by mouth at bedtime.   lisinopril 2.5 MG tablet Commonly known as:  PRINIVIL,ZESTRIL Take 1 tablet (2.5 mg total) by mouth daily.   metoprolol tartrate 25 MG tablet Commonly known as:  LOPRESSOR Take 0.5 tablets (12.5 mg total) by mouth 2 (two) times daily.   multivitamin with minerals Tabs tablet Take 1 tablet by mouth daily.   traMADol 50 MG tablet Commonly known as:  ULTRAM Take 1 tablet (50 mg total) by mouth every 6 (six) hours as needed for moderate pain.   warfarin 5 MG tablet Commonly known as:  COUMADIN Take 1 tablet (5 mg total) by mouth daily at 6 PM. Or as directed.      Follow-up Information    Rexene Alberts, MD Follow up on 08/13/2016.   Specialty:  Cardiothoracic Surgery Why:  PA/LAT CXR to be taken (at [facility] which is in the same building as Dr. Brock Ra) on 08/13/2016 at 3:00 pm;Appointment time is at 3:30 pm Contact information: [address] [place] [address] [phone]        Loralie Champagne, MD .   Specialty:  Cardiology Why:  Call for a 2 week  follow up appointment Contact information: [id number] [place] [address] [phone]        SNF .   Why:  Please draw a PT and INR (as is on Coumadin, INR goal 2-2.5) on Friday 08/07/2016 and call or fax results to Dr. Claris Gladden office. Also, please remove all chest tube sutures on 08/14/2016.         The patient has been discharged on:   1.Beta Blocker:  Yes [ x  ]                              No   [   ]                              If No, reason:  2.Ace Inhibitor/ARB: Yes [  x ]                                     No  [    ]                                     If No, reason:  3.Statin:   Yes [x   ]                  No  [   ]                  If No, reason:  4.Ecasa:  Yes  [ x ]                  No   [   ]  If No, reason:    Signed: Dylan Monforte M PA-C 08/05/2016, 11:16 AM

## 2016-07-31 NOTE — Progress Notes (Signed)
Received report on patient -

## 2016-07-31 NOTE — Progress Notes (Signed)
[address]       [place] [address]             [phone]        CARDIOTHORACIC SURGERY PROGRESS NOTE   R2 Days Post-Op Procedure(s) (LRB): MINIMALLY INVASIVE MITRAL VALVE REPAIR (MVR) USING 34 MEMO  ANNULOPLASTY RING. (Right) TRANSESOPHAGEAL ECHOCARDIOGRAM (TEE) (N/A)  Subjective: Had a good night.  Mild soreness in chest.  Some dizziness and intermittent nausea that patient reports is positional.  Tolerating liquids but no appetite yet.  Objective: Vital signs: BP Readings from Last 1 Encounters:  07/31/16 (!) 147/84   Pulse Readings from Last 1 Encounters:  07/31/16 78   Resp Readings from Last 1 Encounters:  07/31/16 15   Temp Readings from Last 1 Encounters:  07/31/16 98.7 F (37.1 C) (Oral)    Hemodynamics:    Physical Exam:  Rhythm:   sinus  Breath sounds: clear  Heart sounds:  RRR w/out murmur  Incisions:  Dressings dry, intact  Abdomen:  Soft, non-distended, non-tender  Extremities:  Warm, well-perfused  Chest tubes:  Decreasing but significant volume thin serosanguinous output, no air leak    Intake/Output from previous day: 09/07 0701 - 09/08 0700 In: 1108.3 [P.O.:480; I.V.:478.3; IV Piggyback:150] Out: 1970 [Urine:1200; Chest Tube:770] Intake/Output this shift: No intake/output data recorded.  Lab Results:  CBC: Recent Labs  07/30/16 1700 07/30/16 1701 07/31/16 0358  WBC 22.1*  --  18.0*  HGB 9.6* 9.2* 9.0*  HCT 29.6* 27.0* 28.0*  PLT 87*  --  84*    BMET:  Recent Labs  07/30/16 0412  07/30/16 1701 07/31/16 0358  NA 133*  --  131* 127*  K 3.5  --  5.0 4.8  CL 104  --  95* 98*  CO2 24  --   --  25  GLUCOSE 129*  --  163* 114*  BUN 5*  --  12 14  CREATININE 0.43*  < > 0.70 0.63  CALCIUM 6.8*  --   --  7.8*  < > = values in this interval not displayed.   PT/INR:   Recent Labs  07/31/16 0358  LABPROT 16.7*  INR 1.34    CBG (last 3)   Recent Labs  07/30/16 1923 07/30/16 2350  07/31/16 0346  GLUCAP 159* 98 107*    ABG    Component Value Date/Time   PHART 7.280 (L) 07/30/2016 0011   PCO2ART 46.7 07/30/2016 0011   PO2ART 135.0 (H) 07/30/2016 0011   HCO3 22.0 07/30/2016 0011   TCO2 23 07/30/2016 1701   ACIDBASEDEF 5.0 (H) 07/30/2016 0011   O2SAT 75.2 07/30/2016 0405    CXR: PORTABLE CHEST 1 VIEW  COMPARISON:  07/30/2016 .  FINDINGS: Left IJ sheath in stable position. Interim removal of Swan-Ganz catheter. Right chest tube and mediastinal drainage catheters are in stable position. Prior cardiac valve replacement. Stable cardiomegaly. Persistent left base infiltrate and/or edema. Interim partial clearing of right base infiltrate/edema. Low lung volumes with bibasilar atelectasis again noted No prominent pleural effusion or pneumothorax. Mild right chest wall subcutaneous emphysema noted.  IMPRESSION: 1. Interim removal Swan-Ganz catheter. Left IJ sheath, right chest tube, and right mediastinal drainage catheter in stable position. No pneumothorax.  2. Persistent left base infiltrate and/or edema. Interim partial clearing of right base infiltrate/edema. Low lung volumes with basilar atelectasis.  3. Prior cardiac valve replacement. Stable cardiomegaly . No pulmonary venous congestion.   Electronically Signed   By: Marcello Moores  Register  On: 07/31/2016 07:47  Assessment/Plan: S/P Procedure(s) (LRB): MINIMALLY INVASIVE MITRAL VALVE REPAIR (MVR) USING 34 MEMO  ANNULOPLASTY RING. (Right) TRANSESOPHAGEAL ECHOCARDIOGRAM (TEE) (N/A)  Doing well POD2 Maintaining NSR w/ stable BP off Neo drip Breathing comfortably w/ O2 sats 94-98% on RA Expected post op acute blood loss anemia, Hgb 9.0 stable Expected post op atelectasis, mild Expected post op volume excess, mild, diuresing Post op thrombocytopenia, platelet count 84k stable Hyponatremia, presumably secondary to volume overload and free water excess but patient had severe hyponatremia after  surgical procedure in the past    Mobilize  Diuresis - lasix to stimulate UOP and watch sodium  Leave chest tubes in until output decreases  Coumadin  Transfer step down  Rexene Alberts, MD 07/31/2016 8:02 AM

## 2016-07-31 NOTE — Progress Notes (Signed)
Patient has arrived on unit from 2S walking while pushing wheelchair, and accompanied by RN and family.Patient oriented to unit, assessed, placed on tele, VS were stable.

## 2016-07-31 NOTE — Progress Notes (Signed)
    Subjective:  Feels dizzy intermittently. Expected incisional discomfort.   Objective:  Vital Signs in the last 24 hours: Temp:  [98.1 F (36.7 C)-98.7 F (37.1 C)] 98.7 F (37.1 C) (09/08 0400) Pulse Rate:  [70-90] 78 (09/08 0600) Resp:  [11-26] 15 (09/08 0600) BP: (87-147)/(52-84) 147/84 (09/08 0600) SpO2:  [94 %-100 %] 96 % (09/08 0600) Arterial Line BP: (92-117)/(42-56) 111/56 (09/07 1400) Weight:  [52.3 kg (115 lb 4.8 oz)] 52.3 kg (115 lb 4.8 oz) (09/08 0500)  Intake/Output from previous day: 09/07 0701 - 09/08 0700 In: 1108.3 [P.O.:480; I.V.:478.3; IV Piggyback:150] Out: 1970 [Urine:1200; Chest Tube:770]  Physical Exam: Pt is alert and oriented, NAD HEENT: normal Neck: JVP - normal Lungs: CTA bilaterally CV: RRR with friction rub, no murmur Abd: soft, NT Ext: trace edema Skin: warm/dry no rash   Lab Results:  Recent Labs  07/30/16 1700 07/30/16 1701 07/31/16 0358  WBC 22.1*  --  18.0*  HGB 9.6* 9.2* 9.0*  PLT 87*  --  84*    Recent Labs  07/30/16 0412  07/30/16 1701 07/31/16 0358  NA 133*  --  131* 127*  K 3.5  --  5.0 4.8  CL 104  --  95* 98*  CO2 24  --   --  25  GLUCOSE 129*  --  163* 114*  BUN 5*  --  12 14  CREATININE 0.43*  < > 0.70 0.63  < > = values in this interval not displayed. No results for input(s): TROPONINI in the last 72 hours.  Invalid input(s): CK, MB  Cardiac Studies: CXR: IMPRESSION: 1. Interim removal Swan-Ganz catheter. Left IJ sheath, right chest tube, and right mediastinal drainage catheter in stable position. No pneumothorax.  2. Persistent left base infiltrate and/or edema. Interim partial clearing of right base infiltrate/edema. Low lung volumes with basilar atelectasis.  3. Prior cardiac valve replacement. Stable cardiomegaly . No pulmonary venous congestion.  Tele: Personally reviewed: normal sinus rhythm  Assessment/Plan:  1. Severe mitral regurgitation postoperative day 2 from minimally  invasive mitral valve repair 2. Postoperative blood loss anemia, stable 3. Postoperative thrombocytopenia, stable  Hemodynamically stable, maintaining sinus rhythm, has weaned off of low-dose Neo-Synephrine. Continue progression per Dr. Roxy Manns. Appreciate TCTS care.   Sherren Mocha, M.D. 07/31/2016, 8:02 AM

## 2016-07-31 NOTE — Plan of Care (Signed)
Problem: Bowel/Gastric: Goal: Gastrointestinal status for postoperative course will improve Outcome: Progressing Bowel sounds noted, pt still with nausea due to positional change

## 2016-07-31 NOTE — Progress Notes (Signed)
CARDIAC REHAB PHASE I   PRE:  Rate/Rhythm: 84 SR  BP:  Sitting: 185/109        SaO2: 99 RA  MODE:  Ambulation: 350 ft   POST:  Rate/Rhythm: 88 SR  BP:  Sitting: 152/83         SaO2: 98 RA  Pt ambulated 350 ft on RA, rolling walker, chest tube, assist x1, slow, fairly steady gait, tolerated fairly well with no complaints. Encouraged IS, additional ambulation today. Pt to recliner after walk, feet elevated, call bell within reach. Will follow.   Gibbon, RN, BSN 07/31/2016 2:36 PM

## 2016-08-01 LAB — BASIC METABOLIC PANEL
Anion gap: 8 (ref 5–15)
BUN: 10 mg/dL (ref 6–20)
CHLORIDE: 90 mmol/L — AB (ref 101–111)
CO2: 28 mmol/L (ref 22–32)
Calcium: 8.2 mg/dL — ABNORMAL LOW (ref 8.9–10.3)
Creatinine, Ser: 0.51 mg/dL (ref 0.44–1.00)
GFR calc non Af Amer: >60 mL/min (ref >60)
Glucose, Bld: 134 mg/dL — ABNORMAL HIGH (ref 65–99)
POTASSIUM: 4.7 mmol/L (ref 3.5–5.1)
SODIUM: 126 mmol/L — AB (ref 135–145)

## 2016-08-01 LAB — CBC
HCT: 28.6 % — ABNORMAL LOW (ref 36.0–46.0)
Hemoglobin: 9.8 g/dL — ABNORMAL LOW (ref 12.0–15.0)
MCH: 30.9 pg (ref 26.0–34.0)
MCHC: 34.3 g/dL (ref 30.0–36.0)
MCV: 90.2 fL (ref 78.0–100.0)
PLATELETS: 100 10*3/uL — AB (ref 150–400)
RBC: 3.17 MIL/uL — ABNORMAL LOW (ref 3.87–5.11)
RDW: 13.4 % (ref 11.5–15.5)
WBC: 20.3 10*3/uL — ABNORMAL HIGH (ref 4.0–10.5)

## 2016-08-01 LAB — PROTIME-INR
INR: 1.32
Prothrombin Time: 16.5 seconds — ABNORMAL HIGH (ref 11.4–15.2)

## 2016-08-01 MED ORDER — ATORVASTATIN CALCIUM 10 MG PO TABS
10.0000 mg | ORAL_TABLET | Freq: Every day | ORAL | Status: DC
  Administered 2016-08-02 – 2016-08-04 (×3): 10 mg via ORAL
  Filled 2016-08-01 (×3): qty 1

## 2016-08-01 MED ORDER — LISINOPRIL 2.5 MG PO TABS
2.5000 mg | ORAL_TABLET | Freq: Every day | ORAL | Status: DC
  Administered 2016-08-01: 2.5 mg via ORAL
  Filled 2016-08-01: qty 1

## 2016-08-01 MED ORDER — FUROSEMIDE 40 MG PO TABS
40.0000 mg | ORAL_TABLET | Freq: Every day | ORAL | Status: DC
  Administered 2016-08-02 – 2016-08-03 (×2): 40 mg via ORAL
  Filled 2016-08-01 (×2): qty 1

## 2016-08-01 MED ORDER — METOPROLOL TARTRATE 12.5 MG HALF TABLET
12.5000 mg | ORAL_TABLET | Freq: Two times a day (BID) | ORAL | Status: DC
  Administered 2016-08-01 – 2016-08-05 (×9): 12.5 mg via ORAL
  Filled 2016-08-01 (×9): qty 1

## 2016-08-01 MED ORDER — FUROSEMIDE 10 MG/ML IJ SOLN
40.0000 mg | Freq: Two times a day (BID) | INTRAMUSCULAR | Status: DC
  Administered 2016-08-01: 40 mg via INTRAVENOUS
  Filled 2016-08-01: qty 4

## 2016-08-01 MED ORDER — FUROSEMIDE 40 MG PO TABS: 40.0000 mg | ORAL_TABLET | Freq: Two times a day (BID) | ORAL | Status: DC

## 2016-08-01 MED ORDER — ASPIRIN EC 81 MG PO TBEC
81.0000 mg | DELAYED_RELEASE_TABLET | Freq: Every day | ORAL | Status: DC
  Administered 2016-08-01 – 2016-08-05 (×5): 81 mg via ORAL
  Filled 2016-08-01 (×5): qty 1

## 2016-08-01 NOTE — Progress Notes (Signed)
EPWs pulled per protocol.  No ectopy or other problem noted at this time.  Will continue to monitor.

## 2016-08-01 NOTE — Progress Notes (Signed)
CARDIAC REHAB PHASE I   PRE:  Rate/Rhythm: 78 BP:  Supine:   Sitting: 109/73  Standing:    SaO2: 95 RA  MODE:  Ambulation: 350 ft   POST:  Rate/Rhythm:   BP:  Supine:   Sitting: 117/65  Standing:    SaO2: 96 RA  Pt up to ambulate 350 ft x 1 assist and RW.  Pt ambulated slowly and timid at times.  Needed help with steering walker away from the walls.  Pt to BR for large BM. Pt back to bed due to feeling sleepy and tired.  Pt assisted back to bed, family at bedside call bell within reach. Advised pt to continue with progressive ambulation with floor nursing staff. Cherre Huger, BSN [phone]

## 2016-08-01 NOTE — Progress Notes (Addendum)
[address]       [place], [address]             [phone]      3 Days Post-Op Procedure(s) (LRB): MINIMALLY INVASIVE MITRAL VALVE REPAIR (MVR) USING 34 MEMO  ANNULOPLASTY RING. (Right) TRANSESOPHAGEAL ECHOCARDIOGRAM (TEE) (N/A) Subjective: Feels ok, nausea is slowly improving  Objective: Vital signs in last 24 hours: Temp:  [97.5 F (36.4 C)-98.7 F (37.1 C)] 97.5 F (36.4 C) (09/09 0442) Pulse Rate:  [78-85] 78 (09/09 0442) Cardiac Rhythm: Normal sinus rhythm;Heart block (09/09 0700) Resp:  [15-20] 18 (09/09 0442) BP: (136-163)/(70-82) 136/82 (09/09 0442) SpO2:  [95 %-99 %] 98 % (09/09 0442) Weight:  [113 lb 9.6 oz (51.5 kg)] 113 lb 9.6 oz (51.5 kg) (09/09 0442)  Hemodynamic parameters for last 24 hours:    Intake/Output from previous day: 09/08 0701 - 09/09 0700 In: 503 [P.O.:480; I.V.:23] Out: 1900 [Urine:1750; Chest Tube:150] Intake/Output this shift: No intake/output data recorded.  General appearance: alert, cooperative and no distress Heart: regular rate and rhythm and no murmur Lungs: clear to auscultation bilaterally Abdomen: benign Extremities: no edema Wound: incis healing well  Lab Results:  Recent Labs  07/31/16 0358 08/01/16 0423  WBC 18.0* 20.3*  HGB 9.0* 9.8*  HCT 28.0* 28.6*  PLT 84* 100*   BMET:  Recent Labs  07/31/16 0358 08/01/16 0423  NA 127* 126*  K 4.8 4.7  CL 98* 90*  CO2 25 28  GLUCOSE 114* 134*  BUN 14 10  CREATININE 0.63 0.51  CALCIUM 7.8* 8.2*    PT/INR:  Recent Labs  08/01/16 0423  LABPROT 16.5*  INR 1.32   ABG    Component Value Date/Time   PHART 7.280 (L) 07/30/2016 0011   HCO3 22.0 07/30/2016 0011   TCO2 23 07/30/2016 1701   ACIDBASEDEF 5.0 (H) 07/30/2016 0011   O2SAT 75.2 07/30/2016 0405   CBG (last 3)   Recent Labs  07/30/16 2350 07/31/16 0346 07/31/16 0816  GLUCAP 98 107* 98    Meds Scheduled Meds: . acetaminophen  1,000 mg Oral Q6H  . aspirin EC  81 mg Oral  Daily  . atorvastatin  10 mg Oral q1800  . bisacodyl  10 mg Oral Daily   Or  . bisacodyl  10 mg Rectal Daily  . docusate sodium  200 mg Oral Daily  . furosemide  40 mg Intravenous BID  . [START ON 08/02/2016] furosemide  40 mg Oral BID  . lisinopril  2.5 mg Oral Daily  . mouth rinse  15 mL Mouth Rinse BID  . metoprolol tartrate  12.5 mg Oral BID  . pantoprazole  40 mg Oral Daily  . potassium chloride  20 mEq Oral Daily  . sodium chloride flush  3 mL Intravenous Q12H  . sodium chloride flush  3 mL Intravenous Q12H  . warfarin  2.5 mg Oral q1800  . Warfarin - Physician Dosing Inpatient   Does not apply q1800   Continuous Infusions: . sodium chloride     PRN Meds:.sodium chloride, metoprolol, morphine injection, ondansetron (ZOFRAN) IV, oxyCODONE, sodium chloride flush, sodium chloride flush, traMADol  Xrays Dg Chest Port 1 View  Result Date: 07/31/2016 CLINICAL DATA:  Chest tube. EXAM: PORTABLE CHEST 1 VIEW COMPARISON:  07/30/2016 . FINDINGS: Left IJ sheath in stable position. Interim removal of Swan-Ganz catheter. Right chest tube and mediastinal drainage catheters are in stable position. Prior cardiac valve replacement. Stable cardiomegaly. Persistent left base infiltrate  and/or edema. Interim partial clearing of right base infiltrate/edema. Low lung volumes with bibasilar atelectasis again noted No prominent pleural effusion or pneumothorax. Mild right chest wall subcutaneous emphysema noted. IMPRESSION: 1. Interim removal Swan-Ganz catheter. Left IJ sheath, right chest tube, and right mediastinal drainage catheter in stable position. No pneumothorax. 2. Persistent left base infiltrate and/or edema. Interim partial clearing of right base infiltrate/edema. Low lung volumes with basilar atelectasis. 3. Prior cardiac valve replacement. Stable cardiomegaly . No pulmonary venous congestion. Electronically Signed   By: Marcello Moores  Register   On: 07/31/2016 07:47    Assessment/Plan: S/P  Procedure(s) (LRB): MINIMALLY INVASIVE MITRAL VALVE REPAIR (MVR) USING 34 MEMO  ANNULOPLASTY RING. (Right) TRANSESOPHAGEAL ECHOCARDIOGRAM (TEE) (N/A)  1 primary c/o is nausea- multifactorial. Limit narcotics as able- clinically improving and tolerating some nutrition 2 sinus rhythm- some HTN, increase ACE a little 3 still with mod CT drainage- serosang. - 330 yesterday and 150 so far today 4 slightly worsening hyponatremia/hypochloremia- will place on fluid restriction and cont diuresis for now. Approaching euvolemia. Not alkalotic currently . Protein stores normal preop. If worsens will need further evaluation. Renal fxn is normal. 5 Anemia is stable, thrombocytopenia is improving.  6 leukocytosis slightly worse, no fevers- monitor closely, hopefully just reactive. Will gett diff with tomorrow CBC 7 doing well with rehab 8 INR- cont to monitor, may need increase in coumadin dose   LOS: 3 days    GOLD,WAYNE E 08/01/2016  I have seen and examined the patient and agree with the assessment and plan as outlined.  Leave chest tubes in for now - hopefully can d/c tomorrow.  Continue gentle diuresis.  Start low dose ACE-I today.  Continue beta blocker.  Will ask for PT consult to evaluate needs and assist with decisions regarding disposition   Rexene Alberts, MD 08/01/2016 11:15 AM

## 2016-08-02 LAB — CBC WITH DIFFERENTIAL/PLATELET
Basophils Absolute: 0 10*3/uL (ref 0.0–0.1)
Basophils Relative: 0 %
EOS ABS: 0 10*3/uL (ref 0.0–0.7)
EOS PCT: 0 %
HCT: 27.7 % — ABNORMAL LOW (ref 36.0–46.0)
Hemoglobin: 9.2 g/dL — ABNORMAL LOW (ref 12.0–15.0)
LYMPHS ABS: 0.9 10*3/uL (ref 0.7–4.0)
Lymphocytes Relative: 8 %
MCH: 30.1 pg (ref 26.0–34.0)
MCHC: 33.2 g/dL (ref 30.0–36.0)
MCV: 90.5 fL (ref 78.0–100.0)
MONO ABS: 0.8 10*3/uL (ref 0.1–1.0)
MONOS PCT: 6 %
Neutro Abs: 10.5 10*3/uL — ABNORMAL HIGH (ref 1.7–7.7)
Neutrophils Relative %: 86 %
PLATELETS: 106 10*3/uL — AB (ref 150–400)
RBC: 3.06 MIL/uL — ABNORMAL LOW (ref 3.87–5.11)
RDW: 13.3 % (ref 11.5–15.5)
WBC: 12.2 10*3/uL — AB (ref 4.0–10.5)

## 2016-08-02 LAB — BASIC METABOLIC PANEL
Anion gap: 6 (ref 5–15)
BUN: 7 mg/dL (ref 6–20)
CALCIUM: 8.3 mg/dL — AB (ref 8.9–10.3)
CO2: 30 mmol/L (ref 22–32)
Chloride: 96 mmol/L — ABNORMAL LOW (ref 101–111)
Creatinine, Ser: 0.49 mg/dL (ref 0.44–1.00)
GFR calc Af Amer: >60 mL/min (ref >60)
GLUCOSE: 98 mg/dL (ref 65–99)
Potassium: 3.4 mmol/L — ABNORMAL LOW (ref 3.5–5.1)
Sodium: 132 mmol/L — ABNORMAL LOW (ref 135–145)

## 2016-08-02 LAB — PROTIME-INR
INR: 1.5
Prothrombin Time: 18.3 seconds — ABNORMAL HIGH (ref 11.4–15.2)

## 2016-08-02 LAB — BRAIN NATRIURETIC PEPTIDE: B NATRIURETIC PEPTIDE 5: 392.5 pg/mL — AB (ref 0.0–100.0)

## 2016-08-02 MED ORDER — POTASSIUM CHLORIDE CRYS ER 20 MEQ PO TBCR
40.0000 meq | EXTENDED_RELEASE_TABLET | Freq: Every day | ORAL | Status: DC
  Administered 2016-08-02 – 2016-08-03 (×2): 40 meq via ORAL
  Filled 2016-08-02 (×2): qty 2

## 2016-08-02 MED ORDER — LISINOPRIL 2.5 MG PO TABS
2.5000 mg | ORAL_TABLET | Freq: Every day | ORAL | Status: DC
  Administered 2016-08-02 – 2016-08-05 (×4): 2.5 mg via ORAL
  Filled 2016-08-02 (×4): qty 1

## 2016-08-02 MED ORDER — LISINOPRIL 5 MG PO TABS
5.0000 mg | ORAL_TABLET | Freq: Every day | ORAL | Status: DC
Start: 2016-08-02 — End: 2016-08-02

## 2016-08-02 NOTE — Consult Note (Signed)
Physical Medicine and Rehabilitation Consult Reason for Consult: Weakness after mitral valve repair Referring Physician: Dr. Ricard Dillon   HPI: Laura Galvan is a 70 y.o. female with pmh of thrombophilia (heterozygous), mitral valve prolapse and regurg, right rotator cuff repair, HLD, breast CA s/p left mastectomy presented on 07/29/16 with worsening mitral valve resulting in minimally invasive mitral valve repair. Pt had been followed for known mitral valve pathology x15 years with recent progression of disease.  Pt admitted to hospital and minimally invasive mitral valve repair performed by Dr. Clydene Laming on 9/6. Hospital course complicated by HTN, chest tubes, weakness, hyperglycemia, post-op pain, nausea, hyponatremia, hypokalemia, leukocytosis, acute blood loss anemia, thrombocytopenia.  Review of Systems  Constitutional: Negative for chills and fever.  Respiratory: Positive for cough.   Gastrointestinal: Negative for nausea and vomiting.  Musculoskeletal: Negative.   Neurological: Negative.  Negative for weakness.  All other systems reviewed and are negative.  Past Medical History:  Diagnosis Date  . Breast cancer ([place])   . Heart murmur   . Hypercholesteremia   . Mitral valve prolapse   . Pneumonia    history of 2014  . 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 Severe mitral regurgitation   . 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 APPENDECTOMY  1960  . CARDIAC CATHETERIZATION N/A 07/16/2016   Procedure: Right/Left Heart Cath and Coronary Angiography;  Surgeon: Sherren Mocha, MD;  Location: [facility];  Service: Cardiovascular;  Laterality: N/A;  . MASTECTOMY Left 2010  . MITRAL VALVE REPAIR Right 07/29/2016   Procedure: MINIMALLY INVASIVE MITRAL VALVE REPAIR (MVR) USING 34 MEMO  ANNULOPLASTY RING.;  Surgeon: Rexene Alberts, MD;  Location: [facility];  Service: Open Heart Surgery;  Laterality: Right;  . PERIPHERAL VASCULAR CATHETERIZATION N/A 07/16/2016   Procedure: Abdominal Aortogram;  Surgeon: Sherren Mocha, MD;  Location: [facility];  Service: Cardiovascular;  Laterality: N/A;  . ROTATOR CUFF REPAIR Right 2011  . TEE WITHOUT CARDIOVERSION N/A 07/29/2016   Procedure: TRANSESOPHAGEAL ECHOCARDIOGRAM (TEE);  Surgeon: Rexene Alberts, MD;  Location: [facility];  Service: Open Heart Surgery;  Laterality: N/A;   Family History  Problem Relation Age of Onset  . Heart disease Father   . Lung cancer Father   . Clotting disorder      "whole family" per pt.   . Lung cancer Mother    Social History:  reports that she has never smoked. She has never used smokeless tobacco. She reports that she drinks alcohol. She reports that she does not use drugs. Allergies:  Allergies  Allergen Reactions  . Desmopressin Other (See Comments)    HYPONATREMIA  . Sulfa Antibiotics Other (See Comments)    UNSPECIFIED REACTION FROM CHILDHOOD   Medications Prior to Admission  Medication Sig Dispense Refill  . amiodarone (PACERONE) 200 MG tablet Take 1 tablet (200 mg total) by mouth daily. 30 tablet 0  . atorvastatin (LIPITOR) 10 MG tablet Take 10 mg by mouth daily at 6 PM.     . Calcium Carbonate-Vitamin D (CALTRATE 600+D PO) Take  1 tablet by mouth daily.    . cetirizine (ZYRTEC) 10 MG chewable tablet Chew 10 mg by mouth at bedtime.     . Multiple Vitamin (MULTIVITAMIN WITH MINERALS) TABS tablet Take 1 tablet by mouth daily.      Home: Home Living Family/patient expects to be discharged to::  Private residence Living Arrangements: Spouse/significant other Available Help at Discharge: Family, Available 24 hours/day Type of Home: House Home Access: Stairs to enter [employer] of Steps: 2-3 Entrance Stairs-Rails: Right, Left Home Layout: Two level, Able to live on main level with bedroom/bathroom Home Equipment: Gilford Rile - 2 wheels, Cane - single point, Careers adviser History: Prior Function Level of Independence: Independent Comments: Patient very active pta, exercising regularly, doing yard work, housework, driving. Functional Status:  Mobility: Bed Mobility General bed mobility comments: Patient in chair Transfers Overall transfer level: Needs assistance Equipment used: Rolling walker (2 wheeled) Transfers: Sit to/from Stand Sit to Stand: Min guard General transfer comment: Patient able to move to standing with use of LUE.  Assist for safety only. Ambulation/Gait Ambulation/Gait assistance: Min guard Ambulation Distance (Feet): 300 Feet Assistive device: Rolling walker (2 wheeled) Gait Pattern/deviations: Step-through pattern, Decreased stride length General Gait Details: Verbal cues for safe use of RW.  Encouraged patient to decrease weight bearing on UE's.   Steady with RW. Gait velocity: decreased Gait velocity interpretation: Below normal speed for age/gender    ADL:    Cognition: Cognition Overall Cognitive Status: Within Functional Limits for tasks assessed Orientation Level: Oriented X4 Cognition Arousal/Alertness: Awake/alert Behavior During Therapy: WFL for tasks assessed/performed Overall Cognitive Status: Within Functional Limits for tasks assessed  Blood pressure (!) 109/56, pulse 77, temperature 97.7 F (36.5 C), temperature source Oral, resp. rate 18, weight 47.2 kg (104 lb 1.6 oz), SpO2 96 %. Physical Exam  Constitutional: She is oriented to person, place, and time. She appears well-developed. No distress.  HENT:  Head:  Normocephalic and atraumatic.  Eyes: Conjunctivae and EOM are normal.  Neck: Normal range of motion. Neck supple.  Cardiovascular: Normal rate and regular rhythm.   Respiratory: Effort normal. No respiratory distress.  +CT tube  GI: Soft. Bowel sounds are normal.  Musculoskeletal: She exhibits no edema or tenderness.  Neurological: She is alert and oriented to person, place, and time.  Motor: 4+/5 throughout Sensation intact to light touch  Skin: Skin is warm and dry. She is not diaphoretic.  Surgical incision c/d/i  Psychiatric: She has a normal mood and affect. Her behavior is normal.    Results for orders placed or performed during the hospital encounter of 07/29/16 (from the past 24 hour(s))  Protime-INR     Status: Abnormal   Collection Time: 08/02/16  4:07 AM  Result Value Ref Range   Prothrombin Time 18.3 (H) 11.4 - 15.2 seconds   INR 1.50   CBC with Differential/Platelet     Status: Abnormal   Collection Time: 08/02/16  4:07 AM  Result Value Ref Range   WBC 12.2 (H) 4.0 - 10.5 K/uL   RBC 3.06 (L) 3.87 - 5.11 MIL/uL   Hemoglobin 9.2 (L) 12.0 - 15.0 g/dL   HCT 27.7 (L) 36.0 - 46.0 %   MCV 90.5 78.0 - 100.0 fL   MCH 30.1 26.0 - 34.0 pg   MCHC 33.2 30.0 - 36.0 g/dL   RDW 13.3 11.5 - 15.5 %   Platelets 106 (L) 150 - 400 K/uL   Neutrophils Relative % 86 %   Neutro Abs  10.5 (H) 1.7 - 7.7 K/uL   Lymphocytes Relative 8 %   Lymphs Abs 0.9 0.7 - 4.0 K/uL   Monocytes Relative 6 %   Monocytes Absolute 0.8 0.1 - 1.0 K/uL   Eosinophils Relative 0 %   Eosinophils Absolute 0.0 0.0 - 0.7 K/uL   Basophils Relative 0 %   Basophils Absolute 0.0 0.0 - 0.1 K/uL  Brain natriuretic peptide     Status: Abnormal   Collection Time: 08/02/16  4:07 AM  Result Value Ref Range   B Natriuretic Peptide 392.5 (H) 0.0 - 100.0 pg/mL  Basic metabolic panel     Status: Abnormal   Collection Time: 08/02/16  4:07 AM  Result Value Ref Range   Sodium 132 (L) 135 - 145 mmol/L   Potassium 3.4 (L) 3.5  - 5.1 mmol/L   Chloride 96 (L) 101 - 111 mmol/L   CO2 30 22 - 32 mmol/L   Glucose, Bld 98 65 - 99 mg/dL   BUN 7 6 - 20 mg/dL   Creatinine, Ser 0.49 0.44 - 1.00 mg/dL   Calcium 8.3 (L) 8.9 - 10.3 mg/dL   GFR calc non Af Amer >60 >60 mL/min   GFR calc Af Amer >60 >60 mL/min   Anion gap 6 5 - 15   No results found.  Assessment/Plan: Diagnosis: Mitral valve repair Labs and images independently reviewed.  Records reviewed and summated above.  1. Does the need for close, 24 hr/day medical supervision in concert with the patient's rehab needs make it unreasonable for this patient to be served in a less intensive setting? No Co-Morbidities requiring supervision/potential complications: HTN (monitor and provide prns in accordance with increased physical exertion and pain), chest tubes (d/c when appropriate), hyperglycemia (cont to monitor, SSI), post-op pain (Biofeedback training with therapies to help reduce reliance on opiate pain medications, monitor pain control during therapies, and sedation at rest and titrate to maximum efficacy to ensure participation and gains in therapies), nausea (improving), hyponatremia (cont to monitor, treat if necessary, avoid excessive hyperglycemia due to history of seizures in this setting), hypokalemia (continue to monitor and replete as necessary), leukocytosis (cont to monitor for signs and symptoms of infection, further workup if indicated), acute blood loss anemia (transfuse if necessary to ensure appropriate perfusion for increased activity tolerance), thrombocytopenia (< 60,000/mm3 no resistive exercise) 2. Due to safety, skin/wound care, disease management, pain management and patient education, does the patient require 24 hr/day rehab nursing? Potentially 3. Does the patient require coordinated care of a physician, rehab nurse, PT (1-2 hrs/day, 5 days/week) and OT (1-2 hrs/day, 5 days/week) to address physical and functional deficits in the context of the above  medical diagnosis(es)? Potentially Addressing deficits in the following areas: endurance, locomotion, bowel/bladder control, bathing, toileting and psychosocial support 4. Can the patient actively participate in an intensive therapy program of at least 3 hrs of therapy per day at least 5 days per week? Yes 5. The potential for patient to make measurable gains while on inpatient rehab is NA 6. Anticipated functional outcomes upon discharge from inpatient rehab are n/a  with PT, n/a with OT, n/a with SLP. 7. Estimated rehab length of stay to reach the above functional goals is: NA 8. Does the patient have adequate social supports and living environment to accommodate these discharge functional goals? N/A 9. Anticipated D/C setting: Home 10. Anticipated post D/C treatments: HH therapy and Home excercise program 11. Overall Rehab/Functional Prognosis: excellent  RECOMMENDATIONS: This patient's condition is appropriate for  continued rehabilitative care in the following setting: [facility] Therapy Patient has agreed to participate in recommended program. N/A Note that insurance prior authorization may be required for reimbursement for recommended care.  Comment: Rehab Admissions Coordinator to follow up.  Delice Lesch, MD 08/02/2016

## 2016-08-02 NOTE — Progress Notes (Addendum)
[address]       [place] [address]             [phone]      4 Days Post-Op Procedure(s) (LRB): MINIMALLY INVASIVE MITRAL VALVE REPAIR (MVR) USING 34 MEMO  ANNULOPLASTY RING. (Right) TRANSESOPHAGEAL ECHOCARDIOGRAM (TEE) (N/A) Subjective: Looks and feels better, no current or recent nausea  Objective: Vital signs in last 24 hours: Temp:  [98.5 F (36.9 C)-98.7 F (37.1 C)] 98.5 F (36.9 C) (09/10 0508) Pulse Rate:  [72-79] 78 (09/10 0508) Cardiac Rhythm: Heart block (09/09 1900) Resp:  [16-18] 16 (09/10 0508) BP: (120-144)/(62-74) 120/62 (09/10 0508) SpO2:  [94 %-96 %] 94 % (09/10 0508) Weight:  [104 lb 1.6 oz (47.2 kg)] 104 lb 1.6 oz (47.2 kg) (09/10 0508)  Hemodynamic parameters for last 24 hours:    Intake/Output from previous day: 09/09 0701 - 09/10 0700 In: 720 [P.O.:720] Out: 2981 [Urine:2650; Stool:1; Chest Tube:330] Intake/Output this shift: No intake/output data recorded.  General appearance: alert, cooperative and no distress Heart: regular rate and rhythm and no murmur Lungs: min dim right base Abdomen: benign Extremities: no edema Wound: incis healing well  Lab Results:  Recent Labs  08/01/16 0423 08/02/16 0407  WBC 20.3* 12.2*  HGB 9.8* 9.2*  HCT 28.6* 27.7*  PLT 100* 106*   BMET:  Recent Labs  08/01/16 0423 08/02/16 0407  NA 126* 132*  K 4.7 3.4*  CL 90* 96*  CO2 28 30  GLUCOSE 134* 98  BUN 10 7  CREATININE 0.51 0.49  CALCIUM 8.2* 8.3*    PT/INR:  Recent Labs  08/02/16 0407  LABPROT 18.3*  INR 1.50   ABG    Component Value Date/Time   PHART 7.280 (L) 07/30/2016 0011   HCO3 22.0 07/30/2016 0011   TCO2 23 07/30/2016 1701   ACIDBASEDEF 5.0 (H) 07/30/2016 0011   O2SAT 75.2 07/30/2016 0405   CBG (last 3)   Recent Labs  07/30/16 2350 07/31/16 0346 07/31/16 0816  GLUCAP 98 107* 98    Meds Scheduled Meds: . acetaminophen  1,000 mg Oral Q6H  . aspirin EC  81 mg Oral Daily  .  atorvastatin  10 mg Oral q1800  . bisacodyl  10 mg Oral Daily   Or  . bisacodyl  10 mg Rectal Daily  . docusate sodium  200 mg Oral Daily  . furosemide  40 mg Oral Daily  . lisinopril  2.5 mg Oral Daily  . mouth rinse  15 mL Mouth Rinse BID  . metoprolol tartrate  12.5 mg Oral BID  . pantoprazole  40 mg Oral Daily  . sodium chloride flush  3 mL Intravenous Q12H  . sodium chloride flush  3 mL Intravenous Q12H  . warfarin  2.5 mg Oral q1800  . Warfarin - Physician Dosing Inpatient   Does not apply q1800   Continuous Infusions: . sodium chloride     PRN Meds:.sodium chloride, metoprolol, morphine injection, ondansetron (ZOFRAN) IV, oxyCODONE, sodium chloride flush, sodium chloride flush, traMADol  Xrays No results found.  Assessment/Plan: S/P Procedure(s) (LRB): MINIMALLY INVASIVE MITRAL VALVE REPAIR (MVR) USING 34 MEMO  ANNULOPLASTY RING. (Right) TRANSESOPHAGEAL ECHOCARDIOGRAM (TEE) (N/A)  1 doing well 2 nausea is currently resolved and po intake improving 3 leukocytosis significantly improved- no fevers of clinical evidence of infection. Neut # on diff 10.5- follow clinically.  4 sodium and chloride trend improving, BNP is 392- cont gentle diuresis for now. Replace K+. Renal fxn  is normal. Cont current fluid restriction. 5 sinus rhythm with occ pvc- cont current beta blocker/replace K+ , recheck magnesium 6 still with moderate CT drainage- 390 yesterday and 90 so far today- keep tubes , hopefully d/c by tomorrow 7 routine rehab- PT to evaluate for poss d/c needs 8 INR rising - cont current coumadin 9 increase ACE inhib a little as some HTN at times  LOS: 4 days    GOLD,WAYNE E 08/02/2016  I have seen and examined the patient and agree with the assessment and plan as outlined.  Will continue current dose lisinopril for now rather than increasing.  Ask for consult from inpatient rehab team to consider d/c to CIR service.  She may not qualify, and otherwise consider short term  stay at SNF for rehab.  Anticipate possible d/c 2-3 days once chest tubes out.  Rexene Alberts, MD 08/02/2016 11:12 AM

## 2016-08-02 NOTE — Evaluation (Signed)
Physical Therapy Evaluation Patient Details Name: Laura Galvan MRN: [id number] DOB: October 17, 1946 Today's Date: 08/02/2016   History of Present Illness  Patient is a 70 yo female admitted 07/29/16 with mitral valve prolapse and mitral regurgitation.  Patient s/p minimally invasive mitral valve repair.   PMH:  MR/MVP, HLD, Lt mastectomy, Rt rotator cuff repair  Clinical Impression  Patient presents with problems listed below.  Will benefit from acute PT to maximize functional independence prior to discharge home with husband.  Recommend f/u HHPT for continued therapy, followed by Cardiac Rehab Phase II program.    Follow Up Recommendations Home health PT;Supervision for mobility/OOB (with Cardiac Rehab Phase II following HHPT)    Equipment Recommendations  None recommended by PT    Recommendations for Other Services       Precautions / Restrictions Precautions Precautions: Other (comment) Precaution Comments: Chest tube Restrictions Weight Bearing Restrictions: No      Mobility  Bed Mobility               General bed mobility comments: Patient in chair  Transfers Overall transfer level: Needs assistance Equipment used: Rolling walker (2 wheeled) Transfers: Sit to/from Stand Sit to Stand: Min guard         General transfer comment: Patient able to move to standing with use of LUE.  Assist for safety only.  Ambulation/Gait Ambulation/Gait assistance: Min guard Ambulation Distance (Feet): 300 Feet Assistive device: Rolling walker (2 wheeled) Gait Pattern/deviations: Step-through pattern;Decreased stride length Gait velocity: decreased Gait velocity interpretation: Below normal speed for age/gender General Gait Details: Verbal cues for safe use of RW.  Encouraged patient to decrease weight bearing on UE's.   Steady with RW.  Stairs            Wheelchair Mobility    Modified Rankin (Stroke Patients Only)       Balance Overall balance assessment: Needs  assistance         Standing balance support: Single extremity supported Standing balance-Leahy Scale: Poor                               Pertinent Vitals/Pain Pain Assessment: No/denies pain    Home Living Family/patient expects to be discharged to:: Private residence Living Arrangements: Spouse/significant other Available Help at Discharge: Family;Available 24 hours/day Type of Home: House Home Access: Stairs to enter Entrance Stairs-Rails: Psychiatric nurse of Steps: 2-3 Home Layout: Two level;Able to live on main level with bedroom/bathroom Home Equipment: Gilford Rile - 2 wheels;Cane - single point;Shower seat      Prior Function Level of Independence: Independent         Comments: Patient very active pta, exercising regularly, doing yard work, housework, driving.     Hand Dominance        Extremity/Trunk Assessment   Upper Extremity Assessment: Overall WFL for tasks assessed           Lower Extremity Assessment: Generalized weakness      Cervical / Trunk Assessment: Other exceptions  Communication   Communication: No difficulties  Cognition Arousal/Alertness: Awake/alert Behavior During Therapy: WFL for tasks assessed/performed Overall Cognitive Status: Within Functional Limits for tasks assessed                      General Comments      Exercises        Assessment/Plan    PT Assessment Patient needs continued PT services  PT  Diagnosis Abnormality of gait;Generalized weakness   PT Problem List Decreased strength;Decreased balance;Decreased mobility;Decreased knowledge of use of DME;Cardiopulmonary status limiting activity  PT Treatment Interventions DME instruction;Gait training;Stair training;Functional mobility training;Therapeutic activities;Therapeutic exercise;Balance training;Patient/family education   PT Goals (Current goals can be found in the Care Plan section) Acute Rehab PT Goals Patient Stated  Goal: To return to prior activities PT Goal Formulation: With patient/family Time For Goal Achievement: 08/09/16 Potential to Achieve Goals: Good    Frequency Min 3X/week   Barriers to discharge        Co-evaluation               End of Session   Activity Tolerance: Patient tolerated treatment well Patient left: in chair;with call bell/phone within reach;with family/visitor present Nurse Communication: Mobility status (Recommend HHPT with f/u CRP Phase II)         Time: [id number] PT Time Calculation (min) (ACUTE ONLY): 26 min   Charges:   PT Evaluation $PT Eval Moderate Complexity: 1 Procedure PT Treatments $Gait Training: 8-22 mins   PT G CodesDespina Pole 2016-08-08, 4:21 PM Carita Pian. Sanjuana Kava, [place] Pager [phone]

## 2016-08-03 DIAGNOSIS — D62 Acute posthemorrhagic anemia: Secondary | ICD-10-CM

## 2016-08-03 DIAGNOSIS — E876 Hypokalemia: Secondary | ICD-10-CM

## 2016-08-03 DIAGNOSIS — J939 Pneumothorax, unspecified: Secondary | ICD-10-CM

## 2016-08-03 DIAGNOSIS — D72829 Elevated white blood cell count, unspecified: Secondary | ICD-10-CM

## 2016-08-03 DIAGNOSIS — Z419 Encounter for procedure for purposes other than remedying health state, unspecified: Secondary | ICD-10-CM

## 2016-08-03 DIAGNOSIS — R739 Hyperglycemia, unspecified: Secondary | ICD-10-CM

## 2016-08-03 DIAGNOSIS — E871 Hypo-osmolality and hyponatremia: Secondary | ICD-10-CM

## 2016-08-03 DIAGNOSIS — G8918 Other acute postprocedural pain: Secondary | ICD-10-CM

## 2016-08-03 DIAGNOSIS — I158 Other secondary hypertension: Secondary | ICD-10-CM

## 2016-08-03 DIAGNOSIS — D696 Thrombocytopenia, unspecified: Secondary | ICD-10-CM

## 2016-08-03 LAB — BASIC METABOLIC PANEL
ANION GAP: 5 (ref 5–15)
BUN: 8 mg/dL (ref 6–20)
CALCIUM: 8.4 mg/dL — AB (ref 8.9–10.3)
CHLORIDE: 99 mmol/L — AB (ref 101–111)
CO2: 32 mmol/L (ref 22–32)
Creatinine, Ser: 0.55 mg/dL (ref 0.44–1.00)
GFR calc non Af Amer: >60 mL/min (ref >60)
GLUCOSE: 111 mg/dL — AB (ref 65–99)
Potassium: 4.5 mmol/L (ref 3.5–5.1)
Sodium: 136 mmol/L (ref 135–145)

## 2016-08-03 LAB — PROTIME-INR
INR: 1.45
PROTHROMBIN TIME: 17.8 s — AB (ref 11.4–15.2)

## 2016-08-03 LAB — MAGNESIUM: Magnesium: 2.1 mg/dL (ref 1.7–2.4)

## 2016-08-03 MED ORDER — WARFARIN SODIUM 4 MG PO TABS
4.0000 mg | ORAL_TABLET | Freq: Every day | ORAL | Status: DC
  Administered 2016-08-03 – 2016-08-04 (×2): 4 mg via ORAL
  Filled 2016-08-03 (×2): qty 1

## 2016-08-03 MED FILL — Heparin Sodium (Porcine) Inj 1000 Unit/ML: INTRAMUSCULAR | Qty: 2500 | Status: AC

## 2016-08-03 NOTE — Clinical Social Work Note (Signed)
Clinical Social Work Assessment  Patient Details  Name: Laura Galvan MRN: [id number] Date of Birth: December 09, 1945  Date of referral:  08/03/16               Reason for consult:  Facility Placement                Permission sought to share information with:  Facility Art therapist granted to share information::  Yes, Verbal Permission Granted  Name::        Agency::  SNFs  Relationship::     Contact Information:     Housing/Transportation Living arrangements for the past 2 months:  Single Family Home Source of Information:  Patient Patient Interpreter Needed:  None Criminal Activity/Legal Involvement Pertinent to Current Situation/Hospitalization:  No - Comment as needed Significant Relationships:  Adult Children, Spouse Lives with:  Spouse Do you feel safe going back to the place where you live?  No Need for family participation in patient care:  No (Coment)  Care giving concerns:  Pt lives 2 hours from hospital with her husband who is physically disabled (requires walker for mobility)- pt does not feel independent enough to return to home with just her husband to assist.   Facilities manager / plan:  CSW spoke with pt concerning possible short term SNF stay for first week or two following surgery.  Pt is agreeable to SNF search but is not totally sure what she wants to do at this time.  Employment status:    Insurance information:  Medicare PT Recommendations:  Home with [place] / Referral to community resources:  [place]  Patient/Family's Response to care:  Pt is agreeable to looking into options but remains undecided- will speak with family to make final decision.  Patient/Family's Understanding of and Emotional Response to Diagnosis, Current Treatment, and Prognosis:  Pt has good understanding of condition and is hopeful she will feel back to normal in another week or so.  Emotional Assessment Appearance:  Appears  stated age Attitude/Demeanor/Rapport:    Affect (typically observed):  Appropriate Orientation:  Oriented to Self, Oriented to Place, Oriented to  Time, Oriented to Situation Alcohol / Substance use:  Not Applicable Psych involvement (Current and /or in the community):  No (Comment)  Discharge Needs  Concerns to be addressed:  Care Coordination Readmission within the last 30 days:  No Current discharge risk:  Physical Impairment, Lack of support system Barriers to Discharge:  Continued Medical Work up   Jorge Ny, LCSW 08/03/2016, 12:22 PM

## 2016-08-03 NOTE — Progress Notes (Signed)
08/03/2016 11:44 AM CT removed per protocol and MD orders. Pt tolerated well.  Instructed pt on bedrest.  Pt voiced understanding.  Encouraged her to call with any changes, especially SOB.  Call bell in reach. [place]

## 2016-08-03 NOTE — Progress Notes (Signed)
Physical Therapy Treatment Patient Details Name: Laura Galvan MRN: [id number] DOB: Oct 20, 1946 Today's Date: 08/03/2016    History of Present Illness Patient is a 70 yo female admitted 07/29/16 with mitral valve prolapse and mitral regurgitation.  Patient s/p minimally invasive mitral valve repair.   PMH:  MR/MVP, HLD, Lt mastectomy, Rt rotator cuff repair    PT Comments    Pt progressing well with mobility but continues to feel fatigued with decreased ability to change pace while ambulating or tolerating long bouts of activity. Encouraged her that this was a normal part of the healing process and that energy would return slowly. HR stable during 350' ambulation and then 4 steps. Pt relayed that her husband uses RW and cannot physically help her at home, hopeful that she can stay with her son in [place] first few weeks and receive HHPT and then potentially return to [facility] for outpt cardiac rehab. PT will continue to follow.   Follow Up Recommendations  Home health PT;Supervision for mobility/OOB (with Cardiac Rehab Phase II following HHPT)     Equipment Recommendations  None recommended by PT    Recommendations for Other Services       Precautions / Restrictions Precautions Precautions: Other (comment) Precaution Comments: Chest tube Restrictions Weight Bearing Restrictions: No    Mobility  Bed Mobility               General bed mobility comments: Patient in chair  Transfers Overall transfer level: Needs assistance   Transfers: Sit to/from Stand Sit to Stand: Supervision         General transfer comment: pt stood from chair without use of UE's  Ambulation/Gait Ambulation/Gait assistance: Min guard Ambulation Distance (Feet): 350 Feet Assistive device: None Gait Pattern/deviations: Step-through pattern;Decreased stride length Gait velocity: decreased Gait velocity interpretation: Below normal speed for age/gender General Gait Details: pt able to ambulate without  RW though min-guard given as pt reports feeling fatigued and slightly unsteady on feet. Pt with decreased pace and inability to change gait speeds.    Stairs Stairs: Yes Stairs assistance: Min guard Stair Management: One rail Right;Alternating pattern;Forwards Number of Stairs: 4 General stair comments: pt able to ascend/ descend stairs safely with use of 1 rail and no significant elevation of HR, 84 bpm (upper 70's before stairs)  Wheelchair Mobility    Modified Rankin (Stroke Patients Only)       Balance Overall balance assessment: Needs assistance Sitting-balance support: No upper extremity supported Sitting balance-Leahy Scale: Good     Standing balance support: No upper extremity supported Standing balance-Leahy Scale: Fair                      Cognition Arousal/Alertness: Awake/alert Behavior During Therapy: WFL for tasks assessed/performed Overall Cognitive Status: Within Functional Limits for tasks assessed                      Exercises      General Comments        Pertinent Vitals/Pain Pain Assessment: Faces Faces Pain Scale: Hurts even more Pain Location: chest tube insertion site Pain Descriptors / Indicators: Grimacing;Guarding    Home Living                      Prior Function            PT Goals (current goals can now be found in the care plan section) Acute Rehab PT Goals Patient Stated Goal: To  return to prior activities PT Goal Formulation: With patient/family Time For Goal Achievement: 08/09/16 Potential to Achieve Goals: Good Progress towards PT goals: Progressing toward goals    Frequency  Min 3X/week    PT Plan Current plan remains appropriate    Co-evaluation             End of Session   Activity Tolerance: Patient tolerated treatment well Patient left: in chair;with call bell/phone within reach     Time: 1035-1101 PT Time Calculation (min) (ACUTE ONLY): 26 min  Charges:  $Gait Training:  23-37 mins                    G Codes:     Leighton Roach, PT  Acute Rehab Services  [phone]  Leighton Roach 08/03/2016, 11:24 AM

## 2016-08-03 NOTE — Progress Notes (Signed)
  CARDIAC REHAB PHASE I   PRE:  Rate/Rhythm: 77 SR  BP:  Supine:   Sitting: 124/83  Standing:    SaO2: 97%RA  MODE:  Ambulation: 550 ft   POST:  Rate/Rhythm: 85 SR  BP:  Supine:   Sitting: 108/66  Standing:    SaO2: 99%RA 1335-1405 Pt walked 550 ft on RA with hand held asst. Gait steady. Discussed CRP 2 and will refer to [employer] program at [facility]. Having some leakage from where chest tube removed. RN aware.   Graylon Good, RN BSN  08/03/2016 2:02 PM

## 2016-08-03 NOTE — Progress Notes (Addendum)
      [address]       [place] [address]             [phone]        5 Days Post-Op Procedure(s) (LRB): MINIMALLY INVASIVE MITRAL VALVE REPAIR (MVR) USING 34 MEMO  ANNULOPLASTY RING. (Right) TRANSESOPHAGEAL ECHOCARDIOGRAM (TEE) (N/A)  Subjective: Patient sitting in chair, no complaints this am.  Objective: Vital signs in last 24 hours: Temp:  [97.7 F (36.5 C)-98.5 F (36.9 C)] 98.5 F (36.9 C) (09/11 0600) Pulse Rate:  [73-77] 77 (09/11 0600) Cardiac Rhythm: Normal sinus rhythm (09/11 0700) Resp:  [18] 18 (09/11 0600) BP: (109-140)/(56-98) 109/62 (09/11 0600) SpO2:  [95 %-96 %] 95 % (09/11 0600) Weight:  [103 lb 9.6 oz (47 kg)] 103 lb 9.6 oz (47 kg) (09/11 0600)  Pre op weight 47 kg Current Weight  08/03/16 103 lb 9.6 oz (47 kg)      Intake/Output from previous day: 09/10 0701 - 09/11 0700 In: 720 [P.O.:720] Out: 30 [Chest Tube:30]   Physical Exam:  Cardiovascular: RRR, no murmurs, gallops, or rubs. Pulmonary: Clear to auscultation bilaterally; no rales, wheezes, or rhonchi. Abdomen: Soft, non tender, bowel sounds present. Extremities: Mild bilateral lower extremity edema. Wounds: Clean and dry.  No erythema or signs of infection.  Lab Results: CBC: Recent Labs  08/01/16 0423 08/02/16 0407  WBC 20.3* 12.2*  HGB 9.8* 9.2*  HCT 28.6* 27.7*  PLT 100* 106*   BMET:  Recent Labs  08/02/16 0407 08/03/16 0309  NA 132* 136  K 3.4* 4.5  CL 96* 99*  CO2 30 32  GLUCOSE 98 111*  BUN 7 8  CREATININE 0.49 0.55  CALCIUM 8.3* 8.4*    PT/INR:  Lab Results  Component Value Date   INR 1.45 08/03/2016   INR 1.50 08/02/2016   INR 1.32 08/01/2016   ABG:  INR: Will add last result for INR, ABG once components are confirmed Will add last 4 CBG results once components are confirmed  Assessment/Plan:  1. CV - SR in the 70's. On Lopressor 12.5 mg bid, Lisinopril 2.5 mg daily, and Coumadin 2.5 mg daily. INR decreased from 1.5 to  1.45. Will increase Coumadin. 2.  Pulmonary - On room air. Chest tubes with 30 cc of output last 24 hours. Will remove. Check CXR in am.Encourage incentive spirometer. 3. Volume Overload - On Lasix 40 mg daily. 4.  Acute blood loss anemia - Last H and H 9.2 and 27.7 5. Thrombocytopenia-last platelets up to 106,000 6. PT recommended home PT. Patient is hoping able to ambulate more on her own and will not need home PT.  7. Possibly home 1-2 days once Coumadin dose determined  ZIMMERMAN,DONIELLE MPA-C 08/03/2016,7:38 AM  I have seen and examined the patient and agree with the assessment and plan as outlined.  D/C chest tubes.  Weight approaching baseline and serum sodium back up to normal.  Continue lasix for now but may not need it much longer.  Anticipate likely d/c 1-2 days.  Rexene [facility], MD 08/03/2016 8:14 AM

## 2016-08-03 NOTE — Care Management Important Message (Signed)
Important Message  Patient Details  Name: Laura Galvan MRN: [id number] Date of Birth: 28-Apr-1946   Medicare Important Message Given:  Yes    Smt Lokey [address]ine Circle 08/03/2016, 1:41 PM

## 2016-08-03 NOTE — Progress Notes (Signed)
Please see consult note.  

## 2016-08-03 NOTE — NC FL2 (Signed)
[place] LEVEL OF CARE SCREENING TOOL     IDENTIFICATION  Patient Name: Laura Galvan Birthdate: 1946-07-12 Sex: female Admission Date (Current Location): 07/29/2016  [facility] and [place] Number:  Herbalist and Address:  [facility] [place] [place] [address], [place], [place] [address]      Provider Number: [id number]  Attending Physician Name and Address:  Rexene [facility], MD  Relative Name and Phone Number:       Current Level of Care: Hospital Recommended Level of Care: [place] Prior Approval Number:    Date Approved/Denied:   PASRR Number: [id number]  Discharge Plan: SNF    Current Diagnoses: Patient Active Problem List   Diagnosis Date Noted  . Pneumothorax   . Surgery, elective   . Other secondary hypertension   . Hyperglycemia   . Post-operative pain   . Hyponatremia   . Hypokalemia   . Leukocytosis   . Acute blood loss anemia   . Thrombocytopenia ([place])   . S/P minimally invasive mitral valve repair 07/29/2016  . MVP (mitral valve prolapse) 07/10/2016  . Mitral regurgitation   . Bronchiectasis ([place]) 09/13/2015  . Cough 08/13/2015    Orientation RESPIRATION BLADDER Height & Weight     Self, Time, Situation, Place  Normal Continent Weight: 47 kg (103 lb 9.6 oz) Height:     BEHAVIORAL SYMPTOMS/MOOD NEUROLOGICAL BOWEL NUTRITION STATUS      Continent Diet (regular)  AMBULATORY STATUS COMMUNICATION OF NEEDS Skin   Limited Assist Verbally Normal                       Personal Care Assistance Level of Assistance  Bathing, Dressing Bathing Assistance: Limited assistance   Dressing Assistance: Limited assistance     Functional Limitations Info             SPECIAL CARE FACTORS FREQUENCY  PT (By licensed PT), OT (By licensed OT)     PT Frequency: 5/wk OT Frequency: 5/wk            Contractures      Additional Factors Info  Code Status, Allergies Code Status Info:  FULL Allergies Info: Desmopressin, Sulfa Antibiotics           Current Medications (08/03/2016):  This is the current hospital active medication list Current Facility-Administered Medications  Medication Dose Route Frequency Provider Last Rate Last Dose  . 0.9 %  sodium chloride infusion  250 mL Intravenous Continuous Rexene [facility], MD      . 0.9 %  sodium chloride infusion  250 mL Intravenous PRN Rexene [facility], MD      . acetaminophen (TYLENOL) tablet 1,000 mg  1,000 mg Oral Q6H Rexene [facility]ts, MD   1,000 mg at 08/03/16 1125  . aspirin EC tablet 81 mg  81 mg Oral Daily Rexene [facility]ts, MD   81 mg at 08/03/16 1028  . atorvastatin (LIPITOR) tablet 10 mg  10 mg Oral q1800 Rexene [facility]ts, MD   10 mg at 08/02/16 1812  . bisacodyl (DULCOLAX) EC tablet 10 mg  10 mg Oral Daily Rexene [facility]ts, MD   10 mg at 07/31/16 [id number]   Or  . bisacodyl (DULCOLAX) suppository 10 mg  10 mg Rectal Daily Rexene [facility]ts, MD      . docusate sodium (COLACE) capsule 200 mg  200 mg Oral Daily Rexene Alberts, MD   200 mg at 08/03/16 1027  .  furosemide (LASIX) tablet 40 mg  40 mg Oral Daily Rexene Alberts, MD   40 mg at 08/03/16 1028  . lisinopril (PRINIVIL,ZESTRIL) tablet 2.5 mg  2.5 mg Oral Daily Rexene Alberts, MD   2.5 mg at 08/03/16 1028  . MEDLINE mouth rinse  15 mL Mouth Rinse BID Rexene [facility]ts, MD   15 mL at 08/03/16 1000  . metoprolol (LOPRESSOR) injection 2.5-5 mg  2.5-5 mg Intravenous Q2H PRN Rexene [facility]ts, MD      . metoprolol tartrate (LOPRESSOR) tablet 12.5 mg  12.5 mg Oral BID Wayne E Gold, PA-C   12.5 mg at 08/03/16 1029  . ondansetron (ZOFRAN) injection 4 mg  4 mg Intravenous Q6H PRN Rexene [facility]ts, MD   4 mg at 08/01/16 1449  . pantoprazole (PROTONIX) EC tablet 40 mg  40 mg Oral Daily Rexene [facility]ts, MD   40 mg at 08/03/16 1028  . potassium chloride SA (K-DUR,KLOR-CON) CR tablet 40 mEq  40 mEq Oral Daily John Giovanni, PA-C   40 mEq at 08/03/16 1028  . sodium chloride flush (NS) 0.9 %  injection 3 mL  3 mL Intravenous Q12H Rexene [facility]ts, MD   3 mL at 08/02/16 1000  . sodium chloride flush (NS) 0.9 % injection 3 mL  3 mL Intravenous PRN Rexene [facility], MD      . sodium chloride flush (NS) 0.9 % injection 3 mL  3 mL Intravenous Q12H Rexene Alberts, MD   3 mL at 08/03/16 1029  . sodium chloride flush (NS) 0.9 % injection 3 mL  3 mL Intravenous PRN Rexene [facility]ts, MD      . traMADol Veatrice Bourbon) tablet 50-100 mg  50-100 mg Oral Q4H PRN Rexene [facility], MD   50 mg at 08/02/16 1823  . warfarin (COUMADIN) tablet 4 mg  4 mg Oral q1800 Nani Skillern, PA-C      . Warfarin - Physician Dosing Inpatient   Does not apply [id number] Rexene [facility], MD         Discharge Medications: Please see discharge summary for a list of discharge medications.  Relevant Imaging Results:  Relevant Lab Results:   Additional Information SS#: [id number]  Jorge Ny, LCSW

## 2016-08-03 NOTE — Progress Notes (Signed)
    Subjective:   She is improving steadily. Chest tubes now out. Maintaining NSR. Had good BM the other day but now a bit constipated again. Making progress with PT. Serum sodium stable. Was a bit dizzy when stood up today.   Objective:  Vital Signs in the last 24 hours: Temp:  [98.5 F (36.9 C)-98.7 F (37.1 C)] 98.7 F (37.1 C) (09/11 2116) Pulse Rate:  [77-79] 79 (09/11 2116) Resp:  [18] 18 (09/11 2116) BP: (109-124)/(62-78) 124/71 (09/11 2116) SpO2:  [95 %-96 %] 96 % (09/11 2116) Weight:  [47 kg (103 lb 9.6 oz)] 47 kg (103 lb 9.6 oz) (09/11 0600)  Intake/Output from previous day: 09/10 0701 - 09/11 0700 In: 720 [P.O.:720] Out: 30 [Chest Tube:30]  Physical Exam: Pt is alert and oriented, NAD Seated in chair eating  HEENT: normal Neck: JVP - normal Lungs: CTA bilaterally CV: RRR. No MR  Incision healing well Abd: soft, NT  Good BS Ext: no edema Skin: warm/dry no rash   Lab Results:  Recent Labs  08/01/16 0423 08/02/16 0407  WBC 20.3* 12.2*  HGB 9.8* 9.2*  PLT 100* 106*    Recent Labs  08/02/16 0407 08/03/16 0309  NA 132* 136  K 3.4* 4.5  CL 96* 99*  CO2 30 32  GLUCOSE 98 111*  BUN 7 8  CREATININE 0.49 0.55   No results for input(s): TROPONINI in the last 72 hours.  Invalid input(s): CK, MB  Cardiac Studies: CXR: IMPRESSION: 1. Interim removal Swan-Ganz catheter. Left IJ sheath, right chest tube, and right mediastinal drainage catheter in stable position. No pneumothorax.  2. Persistent left base infiltrate and/or edema. Interim partial clearing of right base infiltrate/edema. Low lung volumes with basilar atelectasis.  3. Prior cardiac valve replacement. Stable cardiomegaly . No pulmonary venous congestion.  Tele: Personally reviewed: normal sinus rhythm  Assessment/Plan:  1. Severe mitral regurgitation due to MVP s/p minimally invasive mitral valve repair 07/29/16 2. Hyponatremia, improved 3. Postoperative thrombocytopenia,  stable  She is doing well post-op. Maintaining NSR. Appears dry. Will stop lasix. Appetitie picking up slowly and nausea resolving. Serum sodium is stable - will have to watch closely with previous post-op seizures in past. Chest tubes out today. Making progress with PT. Will likely need HHPT. Will need f/u echo prior to d/c.   D/w Dr. Roxy Manns. Hopefully d/c by Thursday when appetite and electrolytes stable.  Laura Mckillop,MD 10:24 PM

## 2016-08-04 ENCOUNTER — Inpatient Hospital Stay (HOSPITAL_COMMUNITY): Payer: Medicare Other

## 2016-08-04 LAB — TYPE AND SCREEN
ABO/RH(D): A POS
ANTIBODY SCREEN: NEGATIVE
UNIT DIVISION: 0
UNIT DIVISION: 0
Unit division: 0
Unit division: 0

## 2016-08-04 LAB — BASIC METABOLIC PANEL
Anion gap: 6 (ref 5–15)
BUN: 6 mg/dL (ref 6–20)
CHLORIDE: 98 mmol/L — AB (ref 101–111)
CO2: 32 mmol/L (ref 22–32)
Calcium: 8.6 mg/dL — ABNORMAL LOW (ref 8.9–10.3)
Creatinine, Ser: 0.47 mg/dL (ref 0.44–1.00)
GFR calc Af Amer: >60 mL/min (ref >60)
GFR calc non Af Amer: >60 mL/min (ref >60)
GLUCOSE: 97 mg/dL (ref 65–99)
POTASSIUM: 4.4 mmol/L (ref 3.5–5.1)
Sodium: 136 mmol/L (ref 135–145)

## 2016-08-04 LAB — PROTIME-INR
INR: 1.59
Prothrombin Time: 19.1 seconds — ABNORMAL HIGH (ref 11.4–15.2)

## 2016-08-04 MED ORDER — TRAMADOL HCL 50 MG PO TABS: 50.0000 mg | ORAL_TABLET | ORAL | Status: DC | PRN

## 2016-08-04 MED ORDER — ACETAMINOPHEN 325 MG PO TABS
650.0000 mg | ORAL_TABLET | Freq: Four times a day (QID) | ORAL | Status: DC | PRN
  Administered 2016-08-04 – 2016-08-05 (×2): 650 mg via ORAL
  Filled 2016-08-04 (×2): qty 2

## 2016-08-04 NOTE — Progress Notes (Addendum)
      [address]       ,[place] [address]             [phone]        6 Days Post-Op Procedure(s) (LRB): MINIMALLY INVASIVE MITRAL VALVE REPAIR (MVR) USING 34 MEMO  ANNULOPLASTY RING. (Right) TRANSESOPHAGEAL ECHOCARDIOGRAM (TEE) (N/A)  Subjective: Patient sitting in chair, no complaints this am.  Objective: Vital signs in last 24 hours: Temp:  [98.4 F (36.9 C)-98.7 F (37.1 C)] 98.4 F (36.9 C) (09/12 0525) Pulse Rate:  [78-79] 78 (09/12 0525) Cardiac Rhythm: Normal sinus rhythm (09/11 1940) Resp:  [18] 18 (09/12 0525) BP: (120-125)/(71-78) 125/78 (09/12 0525) SpO2:  [95 %-96 %] 95 % (09/12 0525) Weight:  [102 lb 3.2 oz (46.4 kg)] 102 lb 3.2 oz (46.4 kg) (09/12 0525)  Pre op weight 47 kg Current Weight  08/04/16 102 lb 3.2 oz (46.4 kg)      Intake/Output from previous day: 09/11 0701 - 09/12 0700 In: 603 [P.O.:600; I.V.:3] Out: -    Physical Exam:  Cardiovascular: RRR, no murmur Pulmonary: Clear to auscultation bilaterally; no rales, wheezes, or rhonchi. Abdomen: Soft, non tender, bowel sounds present. Extremities: No lower extremity edema. Wounds: Clean and dry.  No erythema or signs of infection.  Lab Results: CBC:  Recent Labs  08/02/16 0407  WBC 12.2*  HGB 9.2*  HCT 27.7*  PLT 106*   BMET:   Recent Labs  08/03/16 0309 08/04/16 0351  NA 136 136  K 4.5 4.4  CL 99* 98*  CO2 32 32  GLUCOSE 111* 97  BUN 8 6  CREATININE 0.55 0.47  CALCIUM 8.4* 8.6*    PT/INR:  Lab Results  Component Value Date   INR 1.59 08/04/2016   INR 1.45 08/03/2016   INR 1.50 08/02/2016   ABG:  INR: Will add last result for INR, ABG once components are confirmed Will add last 4 CBG results once components are confirmed  Assessment/Plan:  1. CV - SR in the 70's. On Lopressor 12.5 mg bid, Lisinopril 2.5 mg daily, and Coumadin 4 mg daily. INR slightly increased from 1.45 to 1.59.  2.  Pulmonary - On room air. Chest tubes removed  yesterday. CXR this am appears to show small right apical pneumothorax and subcutaneous emphysema. Encourage incentive spirometer. 3. Volume Overload - Was on Lasix 40 mg daily, but heart failure stopped yesterday. 4.  Acute blood loss anemia - Last H and H 9.2 and 27.7 5. Thrombocytopenia-Last platelets up to 106,000.  6. Hyponatremia-sodium remains 136 7. SNF vs home soon  ZIMMERMAN,DONIELLE MPA-C 08/04/2016,7:20 AM    I have seen and examined the patient and agree with the assessment and plan as outlined.  Lengthy discussion with patient who desires to look into options for d/c to local SNF for rehab.   Rexene Alberts, MD 08/04/2016 11:02 AM

## 2016-08-04 NOTE — Discharge Instructions (Addendum)
We ask the SNF to please do the following: 1. Please obtain vital signs at least one time daily 2.Please weigh the patient daily. If he or she continues to gain weight or develops lower extremity edema, contact the office at [phone]. 3. Ambulate patient at least three times daily and please use sternal precautions.  Mitral Valve Repair, Care After Refer to this sheet in the next few weeks. These instructions provide you with information on caring for yourself after your procedure. Your health care provider may also give you specific instructions. Your treatment has been planned according to current medical practices, but problems sometimes occur. Call your health care provider if you have any problems or questions after your procedure.  HOME CARE INSTRUCTIONS   Take medicines only as directed by your health care provider.  Take your temperature every morning for the first 7 days after surgery. Write these down.  Weigh yourself every morning for at least 7 days after surgery. Write your weight down.  Wear elastic stockings during the day for at least 2 weeks after surgery or as directed by your health care provider. Use them longer if your ankles are swollen. The stockings help blood flow and help reduce swelling in the legs.  Take frequent naps or rest often throughout the day.  Avoid lifting more than 10 lb (4.5 kg) or pushing or pulling things with your arms for 6-8 weeks or as directed by your health care provider.  Avoid driving or airplane travel for 4-6 weeks after surgery or as directed. If you are riding in a car for an extended period, stop every 1-2 hours to stretch your legs.  Avoid crossing your legs.  Avoid climbing stairs and using the handrail to pull yourself up for the first 2-3 weeks after surgery.  Do not take baths for 2-4 weeks after surgery. Take showers once your health care provider approves. Pat incisions dry. Do not rub incisions with a washcloth or  towel.  Return to work as directed by your health care provider.  Drink enough fluids to keep your urine clear or pale yellow.  Do not strain to have a bowel movement. Eat high-fiber foods if you become constipated. You may also take a medicine to help you have a bowel movement (laxative) as directed by your health care provider.  Resume sexual activity as directed by your health care provider. SEEK MEDICAL CARE IF:   You develop a skin rash.   Your weight is increasing each day over 2-3 days.  Your weight increases by 2 or more pounds (1 kg) in a single day.  You have a fever. SEEK IMMEDIATE MEDICAL CARE IF:   You develop chest pain that is not coming from your incision.  You develop shortness of breath or difficulty breathing.  You have drainage, redness, swelling, or pain at your incision site.  You have pus coming from your incision.  You develop light-headedness. MAKE SURE YOU:  Understand these directions.  Will watch your condition.  Will get help right away if you are not doing well or get worse.   This information is not intended to replace advice given to you by your health care provider. Make sure you discuss any questions you have with your health care provider.   Document Released: 05/29/2005 Document Revised: 11/30/2014 Document Reviewed: 04/11/2013 Elsevier Interactive Patient Education 2016 [employer]. ------------------------------------------- Information on my medicine - Coumadin   (Warfarin)  This medication education was reviewed with me or my healthcare  representative as part of my discharge preparation.  The pharmacist that spoke with me during my hospital stay was:  Dareen Piano, [facility]  Why was Coumadin prescribed for you? Coumadin was prescribed for you because you have a blood clot or a medical condition that can cause an increased risk of forming blood clots. Blood clots can cause serious health problems by blocking the flow of blood  to the heart, lung, or brain. Coumadin can prevent harmful blood clots from forming. As a reminder your indication for Coumadin is:   Blood Clot Prevention After Heart Valve Surgery  What test will check on my response to Coumadin? While on Coumadin (warfarin) you will need to have an INR test regularly to ensure that your dose is keeping you in the desired range. The INR (international normalized ratio) number is calculated from the result of the laboratory test called prothrombin time (PT).  If an INR APPOINTMENT HAS NOT ALREADY BEEN MADE FOR YOU please schedule an appointment to have this lab work done by your health care provider within 7 days. Your INR goal is usually a number between:  2 to 3 or your provider may give you a more narrow range like 2-2.5.  Ask your health care provider during an office visit what your goal INR is.  What  do you need to  know  About  COUMADIN? Take Coumadin (warfarin) exactly as prescribed by your healthcare provider about the same time each day.  DO NOT stop taking without talking to the doctor who prescribed the medication.  Stopping without other blood clot prevention medication to take the place of Coumadin may increase your risk of developing a new clot or stroke.  Get refills before you run out.  What do you do if you miss a dose? If you miss a dose, take it as soon as you remember on the same day then continue your regularly scheduled regimen the next day.  Do not take two doses of Coumadin at the same time.  Important Safety Information A possible side effect of Coumadin (Warfarin) is an increased risk of bleeding. You should call your healthcare provider right away if you experience any of the following: ? Bleeding from an injury or your nose that does not stop. ? Unusual colored urine (red or dark brown) or unusual colored stools (red or black). ? Unusual bruising for unknown reasons. ? A serious fall or if you hit your head (even if there is no  bleeding).  Some foods or medicines interact with Coumadin (warfarin) and might alter your response to warfarin. To help avoid this: ? Eat a balanced diet, maintaining a consistent amount of Vitamin K. ? Notify your provider about major diet changes you plan to make. ? Avoid alcohol or limit your intake to 1 drink for women and 2 drinks for men per day. (1 drink is 5 oz. wine, 12 oz. beer, or 1.5 oz. liquor.)  Make sure that ANY health care provider who prescribes medication for you knows that you are taking Coumadin (warfarin).  Also make sure the healthcare provider who is monitoring your Coumadin knows when you have started a new medication including herbals and non-prescription products.  Coumadin (Warfarin)  Major Drug Interactions  Increased Warfarin Effect Decreased Warfarin Effect  Alcohol (large quantities) Antibiotics (esp. Septra/Bactrim, Flagyl, Cipro) Amiodarone (Cordarone) Aspirin (ASA) Cimetidine (Tagamet) Megestrol (Megace) NSAIDs (ibuprofen, naproxen, etc.) Piroxicam (Feldene) Propafenone (Rythmol SR) Propranolol (Inderal) Isoniazid (INH) Posaconazole (Noxafil) Barbiturates (Phenobarbital) Carbamazepine (Tegretol) Chlordiazepoxide (  Librium) Cholestyramine (Questran) Griseofulvin Oral Contraceptives Rifampin Sucralfate (Carafate) Vitamin K   Coumadin (Warfarin) Major Herbal Interactions  Increased Warfarin Effect Decreased Warfarin Effect  Garlic Ginseng Ginkgo biloba Coenzyme Q10 Green tea St. Johns wort    Coumadin (Warfarin) FOOD Interactions  Eat a consistent number of servings per week of foods HIGH in Vitamin K (1 serving =  cup)  Collards (cooked, or boiled & drained) Kale (cooked, or boiled & drained) Mustard greens (cooked, or boiled & drained) Parsley *serving size only =  cup Spinach (cooked, or boiled & drained) Swiss chard (cooked, or boiled & drained) Turnip greens (cooked, or boiled & drained)  Eat a consistent number of  servings per week of foods MEDIUM-HIGH in Vitamin K (1 serving = 1 cup)  Asparagus (cooked, or boiled & drained) Broccoli (cooked, boiled & drained, or raw & chopped) Brussel sprouts (cooked, or boiled & drained) *serving size only =  cup Lettuce, raw (green leaf, endive, romaine) Spinach, raw Turnip greens, raw & chopped   These websites have more information on Coumadin (warfarin):  FailFactory.se; VeganReport.com.au;

## 2016-08-04 NOTE — Progress Notes (Signed)
CARDIAC REHAB PHASE I   PRE:  Rate/Rhythm: 94 SR  BP:  Supine:   Sitting: 108/62 Standing:    SaO2: 100% RA  MODE:  Ambulation: 700 ft   POST:  Rate/Rhythm: 96 SR  BP:  Supine:   Sitting: 121/73  Standing:    SaO2: 95% RA 1020-1045  Pt with hand held assistance 732ft with steady gait. No complaints of SOB.  Tolerated well.  Back to recliner with call bell.  Encouraged more walks with staff later in the day.    Graylon Good, RN BSN  08/04/2016 10:38 AM

## 2016-08-04 NOTE — Progress Notes (Signed)
    Subjective:   Continues to feel better. Up walking several times today. Good po intake. No further dizziness. Maintaining NSR. + BM  Objective:  Vital Signs in the last 24 hours: Temp:  [97.8 F (36.6 C)-98.8 F (37.1 C)] 97.8 F (36.6 C) (09/12 2024) Pulse Rate:  [78-93] 91 (09/12 2024) Resp:  [18-20] 20 (09/12 2024) BP: (93-125)/(50-78) 97/50 (09/12 2024) SpO2:  [95 %-98 %] 98 % (09/12 2024) Weight:  [46.4 kg (102 lb 3.2 oz)] 46.4 kg (102 lb 3.2 oz) (09/12 0525)  Intake/Output from previous day: 09/11 0701 - 09/12 0700 In: 603 [P.O.:600; I.V.:3] Out: -   Physical Exam: Pt is alert and oriented, NAD Seated in chair eating  HEENT: normal Neck: JVP - 8 Lungs: CTA bilaterally CV: RRR. No MR  Incision healing well Abd: soft, NT  Good BS Ext: no edema Skin: warm/dry no rash   Lab Results:  Recent Labs  08/02/16 0407  WBC 12.2*  HGB 9.2*  PLT 106*    Recent Labs  08/03/16 0309 08/04/16 0351  NA 136 136  K 4.5 4.4  CL 99* 98*  CO2 32 32  GLUCOSE 111* 97  BUN 8 6  CREATININE 0.55 0.47   No results for input(s): TROPONINI in the last 72 hours.  Invalid input(s): CK, MB  Cardiac Studies: CXR: IMPRESSION: 1. Interim removal Swan-Ganz catheter. Left IJ sheath, right chest tube, and right mediastinal drainage catheter in stable position. No pneumothorax.  2. Persistent left base infiltrate and/or edema. Interim partial clearing of right base infiltrate/edema. Low lung volumes with basilar atelectasis.  3. Prior cardiac valve replacement. Stable cardiomegaly . No pulmonary venous congestion.  Tele: Personally reviewed: normal sinus rhythm  Assessment/Plan:  1. Severe mitral regurgitation due to MVP s/p minimally invasive mitral valve repair 07/29/16 2. Hyponatremia, improved 3. Postoperative thrombocytopenia, stable  She is doing well post-op. Maintaining NSR. Volume status now increasing slightly after holding lasix and pushing fluids but  weight unchanged. Serum sodium is stable. Making progress with PT. INR 1.59.   Possible d/c to [facility] tomorrow for short-term rehab.   Bensimhon, Daniel,MD 9:47 PM

## 2016-08-05 ENCOUNTER — Inpatient Hospital Stay (HOSPITAL_COMMUNITY): Payer: Medicare Other

## 2016-08-05 DIAGNOSIS — I349 Nonrheumatic mitral valve disorder, unspecified: Secondary | ICD-10-CM

## 2016-08-05 LAB — ECHOCARDIOGRAM COMPLETE
CHL CUP MV DEC (S): 251
CHL CUP MV M VEL: 101
CHL CUP RV SYS PRESS: 25 mmHg
CHL CUP TV REG PEAK VELOCITY: 234 cm/s
EWDT: 251 ms
FS: 26 % — AB (ref 28–44)
IV/PV OW: 0.76
LA ID, A-P, ES: 43 mm
LA diam index: 3.01 cm/m2
LA vol: 28.5 mL
LAVOLA4C: 28 mL
LAVOLIN: 19.9 mL/m2
LDCA: 2.54 cm2
LEFT ATRIUM END SYS DIAM: 43 mm
LV PW d: 9.94 mm — AB (ref 0.6–1.1)
LVOTD: 18 mm
Lateral S' vel: 9.3 cm/s
MV Annulus VTI: 36.1 cm
MV Peak grad: 5 mmHg
MV pk E vel: 108 m/s
MVAP: 3.49 cm2
MVG: 5 mmHg
MVPKAVEL: 97.5 m/s
P 1/2 time: 66 ms
TAPSE: 8.98 mm
TR max vel: 234 cm/s
Weight: 1584 oz

## 2016-08-05 LAB — BASIC METABOLIC PANEL
Anion gap: 6 (ref 5–15)
BUN: 7 mg/dL (ref 6–20)
CHLORIDE: 98 mmol/L — AB (ref 101–111)
CO2: 31 mmol/L (ref 22–32)
CREATININE: 0.47 mg/dL (ref 0.44–1.00)
Calcium: 8.8 mg/dL — ABNORMAL LOW (ref 8.9–10.3)
GFR calc Af Amer: >60 mL/min (ref >60)
GFR calc non Af Amer: >60 mL/min (ref >60)
GLUCOSE: 116 mg/dL — AB (ref 65–99)
POTASSIUM: 4.4 mmol/L (ref 3.5–5.1)
Sodium: 135 mmol/L (ref 135–145)

## 2016-08-05 LAB — PROTIME-INR
INR: 1.65
Prothrombin Time: 19.7 seconds — ABNORMAL HIGH (ref 11.4–15.2)

## 2016-08-05 MED ORDER — TRAMADOL HCL 50 MG PO TABS: 50.0000 mg | ORAL_TABLET | Freq: Four times a day (QID) | ORAL | 0 refills | Status: DC | PRN

## 2016-08-05 MED ORDER — WARFARIN SODIUM 4 MG PO TABS: 4.0000 mg | ORAL_TABLET | Freq: Every day | ORAL | 1 refills | Status: DC

## 2016-08-05 MED ORDER — METOPROLOL TARTRATE 25 MG PO TABS: 12.5000 mg | ORAL_TABLET | Freq: Two times a day (BID) | ORAL | 1 refills | Status: DC

## 2016-08-05 MED ORDER — ASPIRIN 81 MG PO TBEC: 81.0000 mg | DELAYED_RELEASE_TABLET | Freq: Every day | ORAL | Status: AC

## 2016-08-05 MED ORDER — LISINOPRIL 2.5 MG PO TABS: 2.5000 mg | ORAL_TABLET | Freq: Every day | ORAL | 1 refills | Status: DC

## 2016-08-05 MED ORDER — ACETAMINOPHEN 325 MG PO TABS: 650.0000 mg | ORAL_TABLET | Freq: Four times a day (QID) | ORAL | Status: DC | PRN

## 2016-08-05 MED ORDER — WARFARIN SODIUM 5 MG PO TABS: 5.0000 mg | ORAL_TABLET | Freq: Every day | ORAL | Status: DC

## 2016-08-05 MED ORDER — WARFARIN SODIUM 5 MG PO TABS: 5.0000 mg | ORAL_TABLET | Freq: Every day | ORAL | 1 refills | Status: DC

## 2016-08-05 NOTE — Care Management Note (Signed)
Case Management Note Previous CM note initiated by Maryclare Labrador, RN 07/30/2016, 10:07 AM   Patient Details  Name: Laura Galvan MRN: [id number] Date of Birth: Jan 14, 1946  Subjective/Objective:        S/p MINI MVR             Action/Plan:  PTA from home in [place] with husband (husband has some mobility issues).  Post discharge pt will stay in [place] with son and daughter-in-law and later transition back to home in [place].  Family has already arranged for a caregiver in the home when needed - however request for [facility] when she returns to [place],  [place] will ask for [facility] order and will continue to follow for discharge needs.    Pt tx from ICU to 2W on 07/31/16- pt now wanting to check into STSNF options - CSW has been consulted to look into STSNF  Expected Discharge Date:    08/05/16              Expected Discharge Plan:  [place]  In-House Referral:  Clinical Social Work  Discharge planning Services  CM Consult  Post Acute Care Choice:    Choice offered to:     DME Arranged:    DME Agency:     HH Arranged:    [place]:     Status of Service:  Completed, signed off  If discussed at [employer] of Stay Meetings, dates discussed:  08/03/16  Discharge Disposition: skilled facility   Additional Comments:  08/05/16- 1000- Randel Hargens rN, CM- pt for d/c today- has decided to go to STSNF - CSW following for placement needs- plan to d/c later today to [facility].   Dahlia Client [place], RN 08/05/2016, 11:54 AM [phone]

## 2016-08-05 NOTE — Progress Notes (Addendum)
      [address]       [place] [address]             [phone]        7 Days Post-Op Procedure(s) (LRB): MINIMALLY INVASIVE MITRAL VALVE REPAIR (MVR) USING 34 MEMO  ANNULOPLASTY RING. (Right) TRANSESOPHAGEAL ECHOCARDIOGRAM (TEE) (N/A)  Subjective: Patient eating breakfast without complaints.  Objective: Vital signs in last 24 hours: Temp:  [97.8 F (36.6 C)-98.8 F (37.1 C)] 98.8 F (37.1 C) (09/13 0528) Pulse Rate:  [86-93] 86 (09/13 0528) Cardiac Rhythm: Normal sinus rhythm (09/12 1920) Resp:  [20] 20 (09/13 0528) BP: (93-121)/(50-72) 121/72 (09/13 0528) SpO2:  [97 %-98 %] 97 % (09/13 0528) Weight:  [99 lb (44.9 kg)] 99 lb (44.9 kg) (09/13 0528)  Pre op weight 47 kg Current Weight  08/05/16 99 lb (44.9 kg)      Intake/Output from previous day: 09/12 0701 - 09/13 0700 In: 723 [P.O.:720; I.V.:3] Out: -    Physical Exam:  Cardiovascular: RRR, no murmur Pulmonary: Clear to auscultation bilaterally Abdomen: Soft, non tender, bowel sounds present. Extremities: No lower extremity edema. Wounds: Clean and dry.  No erythema or signs of infection.  Lab Results: CBC: No results for input(s): WBC, HGB, HCT, PLT in the last 72 hours. BMET:   Recent Labs  08/04/16 0351 08/05/16 0338  NA 136 135  K 4.4 4.4  CL 98* 98*  CO2 32 31  GLUCOSE 97 116*  BUN 6 7  CREATININE 0.47 0.47  CALCIUM 8.6* 8.8*    PT/INR:  Lab Results  Component Value Date   INR 1.65 08/05/2016   INR 1.59 08/04/2016   INR 1.45 08/03/2016   ABG:  INR: Will add last result for INR, ABG once components are confirmed Will add last 4 CBG results once components are confirmed  Assessment/Plan:  1. CV - SR in the 70's. On Lopressor 12.5 mg bid, Lisinopril 2.5 mg daily, and Coumadin 4 mg daily. INR slightly increased from 1.59 to 1.65.  2.  Pulmonary - On room air. CXR this am is stable- shows small right apical pneumothorax and minor subcutaneous emphysema.  Encourage incentive spirometer. 3.  Acute blood loss anemia - Last H and H 9.2 and 27.7 4. Thrombocytopenia-Last platelets up to 106,000.  5. Hyponatremia-sodium remains 135 6. Chest tube sutures to remain 7. To [facility] when bed available  ZIMMERMAN,DONIELLE MPA-C 08/05/2016,7:40 AM   I have seen and examined the patient and agree with the assessment and plan as outlined.  Will get routine f/u ECHO today prior to d/c.  Patient is ready for d/c later today or tomorrow once bed available at [facility].  Patient will return to see me in the office next week (Thursday 9/21 at 3:30pm) for follow up.  Increase Coumadin to 5 mg/day and recheck INR on Friday.  Rexene Alberts, MD 08/05/2016 10:23 AM

## 2016-08-05 NOTE — Clinical Social Work Placement (Signed)
   CLINICAL SOCIAL WORK PLACEMENT  NOTE  Date:  08/05/2016  Patient Details  Name: Laura Galvan MRN: [id number] Date of Birth: 08-20-1946  Clinical Social Work is seeking post-discharge placement for this patient at the [place] level of care (*CSW will initial, date and re-position this form in  chart as items are completed):  Yes   Patient/family provided with [place] Work Department's list of facilities offering this level of care within the geographic area requested by the patient (or if unable, by the patient's family).  Yes   Patient/family informed of their freedom to choose among providers that offer the needed level of care, that participate in Medicare, Medicaid or managed care program needed by the patient, have an available bed and are willing to accept the patient.  Yes   Patient/family informed of [facility]'s ownership interest in [facility] and [facility], as well as of the fact that they are under no obligation to receive care at these facilities.  PASRR submitted to EDS on 08/03/16     PASRR number received on 08/03/16     Existing PASRR number confirmed on       FL2 transmitted to all facilities in geographic area requested by pt/family on 08/03/16     FL2 transmitted to all facilities within larger geographic area on       Patient informed that his/her managed care company has contracts with or will negotiate with certain facilities, including the following:        Yes   Patient/family informed of bed offers received.  Patient chooses bed at [facility]     Physician recommends and patient chooses bed at      Patient to be transferred to [facility] on 08/05/16.  Patient to be transferred to facility by Family     Patient family notified on 08/05/16 of transfer.  Name of family member notified:  [name]     PHYSICIAN Please sign FL2, Please prepare priority discharge summary, including medications,  Please prepare prescriptions     Additional Comment:  Per MD patient is ready to discharge to [facility]. RN, patient, patient's family, and facility notified of discharge. RN given phone number for report. Patient's RN reported patient's son will provide transportation to [facility]. CSW called and notified Ivin Booty in admissions with [facility] that patient's son would provide transportation around 4:30PM. Ivin Booty confirmed this is okay. CSW signing off.   _______________________________________________ Samule Dry, LCSW 08/05/2016, 2:47 PM

## 2016-08-05 NOTE — Progress Notes (Signed)
  Echocardiogram 2D Echocardiogram has been performed.  Laura Galvan 08/05/2016, 3:21 PM

## 2016-08-05 NOTE — Progress Notes (Signed)
Physical Therapy Treatment Patient Details Name: Laura Galvan MRN: [id number] DOB: 11/12/1946 Today's Date: 08/05/2016    History of Present Illness Patient is a 70 yo female admitted 07/29/16 with mitral valve prolapse and mitral regurgitation.  Patient s/p minimally invasive mitral valve repair.   PMH:  MR/MVP, HLD, Lt mastectomy, Rt rotator cuff repair    PT Comments    Pt with increased ambulation distance as well as full flight of stairs today. HR up to 112 bpm after stairs, back down to 100 bpm within 1 min. 96 bpm after ambulation. Plan is now to go to [facility] as there is illness in home where she was going to be staying. PT will continue to follow.   Follow Up Recommendations  SNF (then CR Phase 2)     Equipment Recommendations  None recommended by PT    Recommendations for Other Services       Precautions / Restrictions Precautions Precaution Comments: chest tube removed Restrictions Weight Bearing Restrictions: No    Mobility  Bed Mobility               General bed mobility comments: Patient in chair  Transfers Overall transfer level: Modified independent Equipment used: None Transfers: Sit to/from Stand Sit to Stand: Modified independent (Device/Increase time)         General transfer comment: pt safe with transfers  Ambulation/Gait Ambulation/Gait assistance: Supervision Ambulation Distance (Feet): 400 Feet Assistive device: None Gait Pattern/deviations: Step-through pattern;Decreased stride length Gait velocity: decreased Gait velocity interpretation: Below normal speed for age/gender General Gait Details: pt continues with decreased pace but able to increase slightly. feeling better with chest tube out. Still mildly unsteady but self correction    Stairs Stairs: Yes Stairs assistance: Min guard Stair Management: Alternating pattern;One rail Right;Forwards Number of Stairs: 10 General stair comments: pt tolerated flight of stairs, HR topped  out at 122 bpm before returning to 100 bpm. No significant dyspnea  Wheelchair Mobility    Modified Rankin (Stroke Patients Only)       Balance Overall balance assessment: Needs assistance Sitting-balance support: No upper extremity supported Sitting balance-Leahy Scale: Good     Standing balance support: No upper extremity supported Standing balance-Leahy Scale: Good                      Cognition Arousal/Alertness: Awake/alert Behavior During Therapy: WFL for tasks assessed/performed Overall Cognitive Status: Within Functional Limits for tasks assessed                      Exercises      General Comments General comments (skin integrity, edema, etc.): pt plans have changed because her son and grandson are sick so going back to that home not a good option currently. Agreeable to [facility] for rehab and will then return to [facility]      Pertinent Vitals/Pain Pain Assessment: No/denies pain    Home Living                      Prior Function            PT Goals (current goals can now be found in the care plan section) Acute Rehab PT Goals Patient Stated Goal: To return to prior activities PT Goal Formulation: With patient/family Time For Goal Achievement: 08/09/16 Potential to Achieve Goals: Good Progress towards PT goals: Progressing toward goals    Frequency  Min 3X/week    PT Plan  Discharge plan needs to be updated    Co-evaluation             End of Session   Activity Tolerance: Patient tolerated treatment well Patient left: in chair;with call bell/phone within reach     Time: 0838-0908 PT Time Calculation (min) (ACUTE ONLY): 30 min  Charges:  $Gait Training: 23-37 mins                    G Codes:     Leighton Roach, PT  Acute Rehab Services  [phone]  Leighton Roach 08/05/2016, 10:22 AM

## 2016-08-05 NOTE — Care Management Important Message (Signed)
Important Message  Patient Details  Name: Laura Galvan MRN: [id number] Date of Birth: 1946-08-29   Medicare Important Message Given:  Yes    Nathen May 08/05/2016, 10:59 AM

## 2016-08-05 NOTE — Progress Notes (Signed)
[id number] Pt just finished working with PT. Education completed with pt in case discharged today. Understanding voiced. Has had Coumadin ed and has booklet. Encouraged IS. Discussed watching sodium and eating heart healthy. Gave ex ed. Referring to CRP 2. Graylon Good RN BSN 08/05/2016 9:29 AM

## 2016-08-06 ENCOUNTER — Non-Acute Institutional Stay (SKILLED_NURSING_FACILITY): Payer: Medicare Other | Admitting: Internal Medicine

## 2016-08-06 ENCOUNTER — Encounter: Payer: Self-pay | Admitting: *Deleted

## 2016-08-06 ENCOUNTER — Encounter: Payer: Self-pay | Admitting: Internal Medicine

## 2016-08-06 DIAGNOSIS — I341 Nonrheumatic mitral (valve) prolapse: Secondary | ICD-10-CM

## 2016-08-06 DIAGNOSIS — J309 Allergic rhinitis, unspecified: Secondary | ICD-10-CM

## 2016-08-06 DIAGNOSIS — R5381 Other malaise: Secondary | ICD-10-CM

## 2016-08-06 DIAGNOSIS — G8912 Acute post-thoracotomy pain: Secondary | ICD-10-CM | POA: Diagnosis not present

## 2016-08-06 DIAGNOSIS — D72829 Elevated white blood cell count, unspecified: Secondary | ICD-10-CM

## 2016-08-06 DIAGNOSIS — Z7901 Long term (current) use of anticoagulants: Secondary | ICD-10-CM | POA: Diagnosis not present

## 2016-08-06 DIAGNOSIS — D696 Thrombocytopenia, unspecified: Secondary | ICD-10-CM

## 2016-08-06 DIAGNOSIS — E785 Hyperlipidemia, unspecified: Secondary | ICD-10-CM | POA: Diagnosis not present

## 2016-08-06 DIAGNOSIS — D62 Acute posthemorrhagic anemia: Secondary | ICD-10-CM

## 2016-08-06 DIAGNOSIS — I10 Essential (primary) hypertension: Secondary | ICD-10-CM | POA: Diagnosis not present

## 2016-08-06 DIAGNOSIS — G8918 Other acute postprocedural pain: Secondary | ICD-10-CM

## 2016-08-06 DIAGNOSIS — R0789 Other chest pain: Secondary | ICD-10-CM

## 2016-08-06 NOTE — Progress Notes (Signed)
LOCATION: [facility]  PCP: Pcp Not In System   Code Status: Full Code  Goals of care: Advanced Directive information Advanced Directives 07/24/2016  Does patient have an advance directive? Yes  Type of Paramedic of [place];Living will  Does patient want to make changes to advanced directive? No - Patient declined  Copy of advanced directive(s) in chart? No - copy requested       Extended Emergency Contact Information Primary Emergency Contact: Shimmel,William S Address: [place], [place] [address] [place] Phone: [phone] Relation: Spouse   Allergies  Allergen Reactions  . Desmopressin Other (See Comments)    HYPONATREMIA  . Sulfa Antibiotics Other (See Comments)    UNSPECIFIED REACTION FROM CHILDHOOD    Chief Complaint  Patient presents with  . New Admit To SNF    New Admission     HPI:  Patient is a 70 y.o. female seen today for short term rehabilitation post hospital admission from 07/29/16-08/05/16 with severe mitral regurgitation with mitral valve prolapse. She underwent mitral valve repair. She is seen in her room today with her daughter in law at bedside.   Review of Systems:  Constitutional: Negative for fever, chills, diaphoresis. Energy level is slowly coming back. HENT: Negative for headache, congestion, nasal discharge, difficulty swallowing.   Eyes: Negative for blurred vision, double vision and discharge. Wears glasses.  Respiratory: Negative for  shortness of breath and wheezing. Positive for occasional cough.  Cardiovascular: Negative for chest pain, palpitations, leg swelling.  Gastrointestinal: Negative for heartburn, nausea, vomiting, abdominal pain, loss of appetite, constipation. Last bowel movement was yesterday. No blood. Genitourinary: Negative for dysuria and flank pain.  Musculoskeletal: Negative for fall in the facility. positive of left mid back discomfort.  Skin:  Negative for itching, rash.  Neurological: Negative for dizziness. Psychiatric/Behavioral: Negative for depression   Past Medical History:  Diagnosis Date  . Breast cancer ([place])   . Heart murmur   . Hypercholesteremia   . Mitral valve prolapse   . Pneumonia    history of 2014  . 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 Severe mitral regurgitation   . 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 APPENDECTOMY  1960  . CARDIAC CATHETERIZATION N/A 07/16/2016   Procedure: Right/Left Heart Cath and Coronary Angiography;  Surgeon: Sherren Mocha, MD;  Location: [facility];  Service: Cardiovascular;  Laterality: N/A;  . MASTECTOMY Left 2010  . MITRAL VALVE REPAIR Right 07/29/2016   Procedure: MINIMALLY INVASIVE MITRAL VALVE REPAIR (MVR) USING 34 MEMO  ANNULOPLASTY RING.;  Surgeon: Rexene Alberts, MD;  Location: [facility];  Service: Open Heart Surgery;  Laterality: Right;  . PERIPHERAL VASCULAR CATHETERIZATION N/A 07/16/2016   Procedure: Abdominal Aortogram;  Surgeon: Sherren Mocha, MD;  Location: [facility];  Service: Cardiovascular;  Laterality: N/A;  . ROTATOR CUFF REPAIR Right 2011  . TEE WITHOUT CARDIOVERSION N/A 07/29/2016   Procedure: TRANSESOPHAGEAL ECHOCARDIOGRAM (TEE);  Surgeon: Rexene Alberts, MD;  Location:  [facility];  Service: Open Heart Surgery;  Laterality: N/A;   Social History:   reports that she has never smoked. She has never used smokeless tobacco. She reports that she drinks alcohol.  She reports that she does not use drugs.  Family History  Problem Relation Age of Onset  . Heart disease Father   . Lung cancer Father   . Clotting disorder      "whole family" per pt.   . Lung cancer Mother     Medications:   Medication List       Accurate as of 08/06/16 12:39 PM. Always use your most recent med list.          acetaminophen 325 MG tablet Commonly known as:  TYLENOL Take 2 tablets (650 mg total) by mouth every 6 (six) hours as needed for mild pain, fever or headache.   aspirin 81 MG EC tablet Take 1 tablet (81 mg total) by mouth daily.   atorvastatin 10 MG tablet Commonly known as:  LIPITOR Take 10 mg by mouth daily at 6 PM.   CALTRATE 600+D PO Take 1 tablet by mouth daily.   cetirizine 10 MG chewable tablet Commonly known as:  ZYRTEC Chew 10 mg by mouth at bedtime.   lisinopril 2.5 MG tablet Commonly known as:  PRINIVIL,ZESTRIL Take 1 tablet (2.5 mg total) by mouth daily.   metoprolol tartrate 25 MG tablet Commonly known as:  LOPRESSOR Take 0.5 tablets (12.5 mg total) by mouth 2 (two) times daily.   multivitamin with minerals Tabs tablet Take 1 tablet by mouth daily.   traMADol 50 MG tablet Commonly known as:  ULTRAM Take 1 tablet (50 mg total) by mouth every 6 (six) hours as needed for moderate pain.   warfarin 5 MG tablet Commonly known as:  COUMADIN Take 1 tablet (5 mg total) by mouth daily at 6 PM. Or as directed.       Immunizations: Immunization History  Administered Date(s) Administered  . Influenza Split 09/05/2015  . PPD Test 08/05/2016     Physical Exam:  Vitals:   08/06/16 1234  BP: (!) 106/51  Pulse: 78  Resp: 18  Temp: 97.9 F (36.6 C)  TempSrc: Oral  SpO2: 97%  Weight: 103 lb (46.7 kg)  Height: 5' 2.5" (1.588 m)   Body mass index is 18.54 kg/m.  General- elderly female, frail and thin built, in no acute distress Head- normocephalic, atraumatic Nose- no maxillary or frontal sinus tenderness, no nasal  discharge Throat- moist mucus membrane  Eyes- PERRLA, EOMI, no pallor, no icterus, no discharge, normal conjunctiva, normal sclera Neck- no cervical lymphadenopathy Cardiovascular- normal s1,s2, no murmur, no leg edema Respiratory- bilateral clear to auscultation, no wheeze, no rhonchi, no crackles, no use of accessory muscles Abdomen- bowel sounds present, soft, non tender Musculoskeletal- able to move all 4 extremities,generalized weakness, no spinal tenderness, has reproducible tenderness on palpation of right lateral chest wall area Neurological- alert and oriented to person, place and time Skin- warm and dry, surgical incision on right chest wall noted- 2 with 1 suture each, 3rd incision with glue in place, bruise noted Psychiatry- normal mood and affect    Labs reviewed: Basic Metabolic Panel:  Recent Labs  07/30/16 0412 07/30/16 1700  08/03/16 0309 08/04/16 0351 08/05/16 0338  NA 133*  --   < > 136 136 135  K 3.5  --   < > 4.5 4.4 4.4  CL 104  --   < > 99* 98*  98*  CO2 24  --   < > 32 32 31  GLUCOSE 129*  --   < > 111* 97 116*  BUN 5*  --   < > 8 6 7   CREATININE 0.43* 0.71  < > 0.55 0.47 0.47  CALCIUM 6.8*  --   < > 8.4* 8.6* 8.8*  MG 1.9 2.0  --  2.1  --   --   < > = values in this interval not displayed. Liver Function Tests:  Recent Labs  07/24/16 1349  AST 25  ALT 22  ALKPHOS 51  BILITOT 0.8  PROT 6.9  ALBUMIN 4.2   No results for input(s): LIPASE, AMYLASE in the last 8760 hours. No results for input(s): AMMONIA in the last 8760 hours. CBC:  Recent Labs  07/31/16 0358 08/01/16 0423 08/02/16 0407  WBC 18.0* 20.3* 12.2*  NEUTROABS  --   --  10.5*  HGB 9.0* 9.8* 9.2*  HCT 28.0* 28.6* 27.7*  MCV 93.3 90.2 90.5  PLT 84* 100* 106*   Cardiac Enzymes: No results for input(s): CKTOTAL, CKMB, CKMBINDEX, TROPONINI in the last 8760 hours. BNP: Invalid input(s): POCBNP CBG:  Recent Labs  07/30/16 2350 07/31/16 0346 07/31/16 0816  GLUCAP 98 107*  98    Radiological Exams: Dg Chest 2 View  Result Date: 08/05/2016 CLINICAL DATA:  Right pneumothorax. Status post mitral valve repair. EXAM: CHEST  2 VIEW COMPARISON:  Chest radiograph 08/04/2016 FINDINGS: Small right apical pneumothorax is unchanged. Prosthetic mitral valve again noted. Blunting of the right costophrenic angle is unchanged, small pleural effusion versus pleural scarring. There is no focal airspace consolidation or pulmonary edema. Cardiomediastinal contours are normal. IMPRESSION: Unchanged right apical pneumothorax. Electronically Signed   By: Ulyses Jarred M.D.   On: 08/05/2016 05:59   Dg Chest 2 View  Result Date: 08/04/2016 CLINICAL DATA:  Status post mitral valve repair five days ago. Right chest wall discomfort from chest tubes. EXAM: CHEST  2 VIEW COMPARISON:  Portable chest x-ray of July 31, 2016 FINDINGS: There is an approximately 5% right apical pneumothorax. There is a small right pleural effusion. There is subcutaneous emphysema on the right. On the left the lung is well-expanded. There is minimal subsegmental atelectasis at the lung base. There it is a trace of pleural fluid on the left. The heart and pulmonary vascularity are normal. The valve ring in the mitral position is stable in appearance. The pulmonary vascularity is normal. There is calcification in the wall of the aortic arch. IMPRESSION: New 5% right apical pneumothorax since removal of the chest tubes. Underlying emphysema. No CHF. Small bilateral pleural effusions, stable. Critical Value/emergent results were called by telephone at the time of interpretation on 08/04/2016 at 8:11 am to Philippa Sicks, RN, who verbally acknowledged these results. Electronically Signed   By: David  Martinique M.D.   On: 08/04/2016 08:12   Dg Chest 2 View  Result Date: 07/24/2016 CLINICAL DATA:  Preoperative examination prior to mitral valve procedure. EXAM: CHEST  2 VIEW COMPARISON:  Chest CT scan of July 24, 2016 FINDINGS: The  lungs are well-expanded. There are coarse lung markings in the right middle lobe anteriorly. There is hazy increased density obscuring a portion of the left heart border. The heart and pulmonary vascularity are normal. The mediastinum is normal in width. There is no pleural effusion. The bony thorax is unremarkable. IMPRESSION: Atelectasis or infiltrate in the anterior medial aspect of the right middle lobe and in the lingula. No CHF.  Followup PA and lateral chest X-ray is recommended in 3-4 weeks following trial of antibiotic therapy to ensure resolution and exclude underlying malignancy. Electronically Signed   By: David  Martinique M.D.   On: 07/24/2016 14:29   Dg Chest Port 1 View  Result Date: 07/31/2016 CLINICAL DATA:  Chest tube. EXAM: PORTABLE CHEST 1 VIEW COMPARISON:  07/30/2016 . FINDINGS: Left IJ sheath in stable position. Interim removal of Swan-Ganz catheter. Right chest tube and mediastinal drainage catheters are in stable position. Prior cardiac valve replacement. Stable cardiomegaly. Persistent left base infiltrate and/or edema. Interim partial clearing of right base infiltrate/edema. Low lung volumes with bibasilar atelectasis again noted No prominent pleural effusion or pneumothorax. Mild right chest wall subcutaneous emphysema noted. IMPRESSION: 1. Interim removal Swan-Ganz catheter. Left IJ sheath, right chest tube, and right mediastinal drainage catheter in stable position. No pneumothorax. 2. Persistent left base infiltrate and/or edema. Interim partial clearing of right base infiltrate/edema. Low lung volumes with basilar atelectasis. 3. Prior cardiac valve replacement. Stable cardiomegaly . No pulmonary venous congestion. Electronically Signed   By: Marcello Moores  Register   On: 07/31/2016 07:47   Dg Chest Port 1 View  Result Date: 07/30/2016 CLINICAL DATA:  Chest tube.  CABG. EXAM: PORTABLE CHEST 1 VIEW COMPARISON:  07/29/2016. FINDINGS: Interim extubation and removal of NG tube. Swan-Ganz  catheter, right chest tube, right mediastinal drainage catheter in stable position in stable position. Prior cardiac valve replacement. Mild cardiomegaly. Bibasilar pulmonary infiltrates and/or pulmonary edema noted. Small bilateral pleural effusions. No pneumothorax. IMPRESSION: 1. Interim extubation removal of NG tube. Remaining tubes including right chest tube and right mediastinal drainage catheter stable position. No pneumothorax. 2. Prior cardiac valve replacement. Cardiomegaly with bilateral basilar pulmonary alveolar infiltrates and/or pulmonary edema. Small bilateral pleural effusions. Electronically Signed   By: Marcello Moores  Register   On: 07/30/2016 07:37   Dg Chest Port 1 View  Result Date: 07/29/2016 CLINICAL DATA:  Status post mitral valve repair EXAM: PORTABLE CHEST 1 VIEW COMPARISON:  07/24/2016 FINDINGS: Cardiac shadow is stable. A Swan-Ganz catheter is noted in the right pulmonary artery. An endotracheal tube and nasogastric catheter are seen in satisfactory position. Right thoracostomy catheter extends to the right apex. A right pericardial drain extends towards the cardiac apex. No pneumothorax is seen. No focal infiltrate is noted. IMPRESSION: Postoperative changes with tubes and lines as described above. No pneumothorax or focal infiltrate is seen. Electronically Signed   By: Inez Catalina M.D.   On: 07/29/2016 15:52   Ct Angio Chest Aorta W &/or Wo Contrast  Result Date: 07/24/2016 CLINICAL DATA:  Preop mitral valve repair. History of breast carcinoma post left mastectomy. EXAM: CT ANGIOGRAPHY CHEST, ABDOMEN, PELVIS WITH CONTRAST TECHNIQUE: Multidetector CT imaging of the chest, abdomen, pelvis was performed using the standard protocol during bolus administration of intravenous contrast. Multiplanar CT image reconstructions and MIPs were obtained to evaluate the vascular anatomy. CONTRAST:  75 mL Isovue 370 IV COMPARISON:  CT chest 09/20/2015 FINDINGS: CHEST Vascular: Right arm IV contrast  administration. SVC patent. Satisfactory opacification of pulmonary arteries noted, and there is no evidence of pulmonary emboli. Patent bilateral pulmonary veins drain into the left atrium. Mild left atrial enlargement. No left atrial appendage thrombus. Scattered coronary calcifications. Adequate contrast opacification of the thoracic aorta with no evidence of dissection, aneurysm, or stenosis. There is classic 3-vessel brachiocephalic arch anatomy without proximal stenosis. Minimal plaque at the origin of the left subclavian artery without stenosis. Otherwise no significant atheromatous irregularity. Mediastinum/Lymph Nodes: No pericardial  effusion. No masses or pathologically enlarged lymph nodes identified. Lungs/Pleura: Linear scarring or subsegmental atelectasis in the inferior aspect of the lingula and right middle lobe, slightly increased since previous exam. Lungs otherwise clear. No effusion. No pneumothorax. Musculoskeletal: No chest wall mass or suspicious bone lesions identified. ABDOMEN Arterial Aorta: No aneurysm, dissection, or stenosis. Mild partially calcified plaque in the infrarenal segment. Celiac axis: Separate origins of the common hepatic artery and splenic artery, both widely patent. Left gastric arises from the splenic artery. 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 Superior mesenteric:  Patent, classic distal branch anatomy. Left renal: Single, patent, with mild nonocclusive calcified ostial plaque. Right renal:          Single, patent Inferior mesenteric:  Patent Left iliac: Minimal calcified plaque at the origin of the common iliac. No aneurysm or stenosis. No significant tortuosity. Right iliac: Scattered calcified nonocclusive plaque in the common iliac. No aneurysm or stenosis. No significant tortuosity. Venous Dedicated venous phase imaging not obtained. Patent bilateral renal veins, splenic vein, and portal  vein noted. Review of the MIP images confirms the above findings. Nonvasular Hepatobiliary: No masses or other significant abnormality. Pancreas: No mass, inflammatory changes, or other significant abnormality. Spleen: Within normal limits in size and appearance. Adrenals/Urinary Tract: No masses identified. No evidence of hydronephrosis. Mild wall thickening of the urinary bladder which is physiologically distended. Stomach/Bowel: No evidence of obstruction, inflammatory process, or abnormal fluid collections. Lymphatic: No pathologically enlarged lymph nodes. Reproductive: Coarse calcification in the anterior uterine body probably degenerated fibroid. Otherwise unremarkable. Other: No ascites.  No free air. Musculoskeletal: Early degenerative spurring in bilateral hips. Negative for fracture. IMPRESSION: 1. Mild aortoiliac plaque without aneurysm, stenosis, or significant tortuosity. 2. Coronary calcifications 3. Some minimal increase in patchy infiltrate or subsegmental atelectasis in the inferior lingula and right middle lobe. Electronically Signed   By: Lucrezia Europe M.D.   On: 07/24/2016 11:47   Ct Angio Abd/pel W/ And/or W/o  Result Date: 07/24/2016 CLINICAL DATA:  Preop mitral valve repair. History of breast carcinoma post left mastectomy. EXAM: CT ANGIOGRAPHY CHEST, ABDOMEN, PELVIS WITH CONTRAST TECHNIQUE: Multidetector CT imaging of the chest, abdomen, pelvis was performed using the standard protocol during bolus administration of intravenous contrast. Multiplanar CT image reconstructions and MIPs were obtained to evaluate the vascular anatomy. CONTRAST:  75 mL Isovue 370 IV COMPARISON:  CT chest 09/20/2015 FINDINGS: CHEST Vascular: Right arm IV contrast administration. SVC patent. Satisfactory opacification of pulmonary arteries noted, and there is no evidence of pulmonary emboli. Patent bilateral pulmonary veins drain into the left atrium. Mild left atrial enlargement. No left atrial appendage thrombus.  Scattered coronary calcifications. Adequate contrast opacification of the thoracic aorta with no evidence of dissection, aneurysm, or stenosis. There is classic 3-vessel brachiocephalic arch anatomy without proximal stenosis. Minimal plaque at the origin of the left subclavian artery without stenosis. Otherwise no significant atheromatous irregularity. Mediastinum/Lymph Nodes: No pericardial effusion. No masses or pathologically enlarged lymph nodes identified. Lungs/Pleura: Linear scarring or subsegmental atelectasis in the inferior aspect of the lingula and right middle lobe, slightly increased since previous exam. Lungs otherwise clear. No effusion. No pneumothorax. Musculoskeletal: No chest wall mass or suspicious bone lesions identified. ABDOMEN Arterial Aorta: No aneurysm, dissection, or stenosis. Mild partially calcified plaque in the infrarenal segment. Celiac axis: Separate origins of the common hepatic artery and splenic artery, both widely patent. Left gastric arises from the splenic artery. Small 8  mm aneurysm with some peripheral wall calcification just beyond the bifurcation of the main splenic artery. Superior mesenteric:  Patent, classic distal branch anatomy. Left renal: Single, patent, with mild nonocclusive calcified ostial plaque. Right renal:          Single, patent Inferior mesenteric:  Patent Left iliac: Minimal calcified plaque at the origin of the common iliac. No aneurysm or stenosis. No significant tortuosity. Right iliac: Scattered calcified nonocclusive plaque in the common iliac. No aneurysm or stenosis. No significant tortuosity. Venous Dedicated venous phase imaging not obtained. Patent bilateral renal veins, splenic vein, and portal vein noted. Review of the MIP images confirms the above findings. Nonvasular Hepatobiliary: No masses or other significant abnormality. Pancreas: No mass, inflammatory changes, or other significant abnormality. Spleen: Within normal limits in size and  appearance. Adrenals/Urinary Tract: No masses identified. No evidence of hydronephrosis. Mild wall thickening of the urinary bladder which is physiologically distended. Stomach/Bowel: No evidence of obstruction, inflammatory process, or abnormal fluid collections. Lymphatic: No pathologically enlarged lymph nodes. Reproductive: Coarse calcification in the anterior uterine body probably degenerated fibroid. Otherwise unremarkable. Other: No ascites.  No free air. Musculoskeletal: Early degenerative spurring in bilateral hips. Negative for fracture. IMPRESSION: 1. Mild aortoiliac plaque without aneurysm, stenosis, or significant tortuosity. 2. Coronary calcifications 3. Some minimal increase in patchy infiltrate or subsegmental atelectasis in the inferior lingula and right middle lobe. Electronically Signed   By: Lucrezia Europe M.D.   On: 07/24/2016 11:47    Assessment/Plan  Physical deconditioning Will have her work with physical therapy and occupational therapy team to help with gait training and muscle strengthening exercises.fall precautions. Skin care. Encourage to be out of bed.   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 inr today 2.0. Currently on coumadin for anticoagulation.has cardiothoracic surgery follow up on 08/13/16   Long term anticoagulation Goal inr 2-3. inr today 2. Continue coumadin 5 mg daily and check inr 08/10/16.  Blood loss anemia  Chest wall soreness Post surgery. Continue tylenol 650 mg q6h prn pain. D/c tramadol with complaints of hallucination  Blood loss anemia Post op, monitor cbc  Leukocytosis Afebrile, likely has reactive leukocytosis monitor wbc and temp curve  Thrombocytopenia No bleed reported, monitor plt  HLD Continue atorvastatin  Allergic rhinitis Continue home regimen claritin  HTN Monitor BP, continue metoprolol tartrate 12.5 mg bid with lisinopril 2.5 mg daily and baby aspirin  Goals of care: short term rehabilitation   Labs/tests ordered: cbc,  cmp, inr  Family/ staff Communication: reviewed care plan with patient and nursing supervisor    Blanchie Serve, MD Internal Medicine [place], [place] [address] Cell Phone (Monday-Friday 8 am - 5 pm): [phone] On Call: [phone] and follow prompts after 5 pm and on weekends Office Phone: [phone] Office Fax: [phone]

## 2016-08-10 LAB — CBC AND DIFFERENTIAL
HCT: 32 % — AB (ref 36–46)
Hemoglobin: 10.3 g/dL — AB (ref 12.0–16.0)
Neutrophils Absolute: 9 /uL
PLATELETS: 333 10*3/uL (ref 150–399)
WBC: 11.6 10*3/mL

## 2016-08-10 LAB — BASIC METABOLIC PANEL
BUN: 10 mg/dL (ref 4–21)
CREATININE: 0.5 mg/dL (ref 0.5–1.1)
GLUCOSE: 111 mg/dL
Potassium: 4.7 mmol/L (ref 3.4–5.3)
Sodium: 140 mmol/L (ref 137–147)

## 2016-08-10 LAB — HEPATIC FUNCTION PANEL
ALT: 18 U/L (ref 7–35)
AST: 16 U/L (ref 13–35)
Alkaline Phosphatase: 67 U/L (ref 25–125)
Bilirubin, Total: 0.2 mg/dL

## 2016-08-11 ENCOUNTER — Encounter: Payer: Self-pay | Admitting: Adult Health

## 2016-08-11 ENCOUNTER — Non-Acute Institutional Stay (SKILLED_NURSING_FACILITY): Payer: Medicare Other | Admitting: Adult Health

## 2016-08-11 DIAGNOSIS — I10 Essential (primary) hypertension: Secondary | ICD-10-CM

## 2016-08-11 DIAGNOSIS — R5381 Other malaise: Secondary | ICD-10-CM | POA: Diagnosis not present

## 2016-08-11 DIAGNOSIS — G8918 Other acute postprocedural pain: Secondary | ICD-10-CM

## 2016-08-11 DIAGNOSIS — E785 Hyperlipidemia, unspecified: Secondary | ICD-10-CM

## 2016-08-11 DIAGNOSIS — R0789 Other chest pain: Secondary | ICD-10-CM

## 2016-08-11 DIAGNOSIS — G8912 Acute post-thoracotomy pain: Secondary | ICD-10-CM | POA: Diagnosis not present

## 2016-08-11 DIAGNOSIS — I341 Nonrheumatic mitral (valve) prolapse: Secondary | ICD-10-CM

## 2016-08-11 DIAGNOSIS — D72829 Elevated white blood cell count, unspecified: Secondary | ICD-10-CM | POA: Diagnosis not present

## 2016-08-11 DIAGNOSIS — D62 Acute posthemorrhagic anemia: Secondary | ICD-10-CM

## 2016-08-11 DIAGNOSIS — J309 Allergic rhinitis, unspecified: Secondary | ICD-10-CM | POA: Diagnosis not present

## 2016-08-11 DIAGNOSIS — Z7901 Long term (current) use of anticoagulants: Secondary | ICD-10-CM | POA: Diagnosis not present

## 2016-08-11 NOTE — Progress Notes (Signed)
Patient ID: Laura Galvan, female   DOB: [birth year], 70 y.o.   MRN: [id number]    DATE:  08/11/2016   MRN:  [id number]  BIRTHDAY: 02-10-1946  Facility:  Nursing Home Location:  [place] Room Number: 1205-P  LEVEL OF CARE:  SNF [phone])  Contact Information    Name Relation Home Work Mobile   Laura, Galvan [phone]  [phone]   Laura, Galvan [phone]     Laura,Galvan Daughter [phone]         Code Status History    Date Active Date Inactive Code Status Order ID Comments User Context   07/29/2016  3:08 PM 08/05/2016  7:50 PM Full Code [id number]  Rexene Alberts, MD Inpatient   07/16/2016 11:41 AM 07/16/2016  6:51 PM Full Code [id number]  Sherren Mocha, MD Inpatient       Chief Complaint  Patient presents with  . Discharge Note    HISTORY OF PRESENT ILLNESS:  This is a 70 year-old female who is for discharge home and will have outpatient care.   She has been admitted to [facility] on 08/05/16 from [facility] with severe mitral regurgitation/ mitral valve prolapse. She had mitral valve repair.  Patient was admitted to this facility for short-term rehabilitation after the patient's recent hospitalization.  Patient has completed SNF rehabilitation and therapy has cleared the patient for discharge.   PAST MEDICAL HISTORY:  Past Medical History:  Diagnosis Date  . Breast cancer ([place])   . Heart murmur   . Hypercholesteremia   . Mitral valve prolapse   . Pneumonia    history of 2014  . 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 Severe mitral regurgitation   . 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   ASPIRIN EC 81 MG EC TABLET    Take 1 tablet (81 mg total) by mouth daily.   ATORVASTATIN (LIPITOR) 10 MG TABLET    Take 10 mg by mouth daily at 6 PM.    CALCIUM CARBONATE-VITAMIN D (CALTRATE 600+D PO)    Take 1 tablet by mouth daily.   CETIRIZINE (ZYRTEC) 10 MG CHEWABLE TABLET    Chew 10 mg by mouth at bedtime.    LISINOPRIL (PRINIVIL,ZESTRIL) 2.5 MG TABLET    Take 1 tablet (2.5 mg total) by mouth daily.   METOPROLOL TARTRATE (LOPRESSOR) 25 MG TABLET    Take 0.5 tablets (12.5 mg total) by mouth 2 (two) times daily.   MULTIPLE VITAMIN (MULTIVITAMIN WITH MINERALS) TABS TABLET    Take 1 tablet by mouth daily.  Modified Medications   No medications on file  Discontinued Medications   TRAMADOL (ULTRAM) 50 MG TABLET    Take 1 tablet (50 mg total) by mouth every 6 (six) hours as needed for moderate pain.   WARFARIN (COUMADIN) 5  MG TABLET    Take 1 tablet (5 mg total) by mouth daily at 6 PM. Or as directed.     Allergies  Allergen Reactions  . Desmopressin Other (See Comments)    HYPONATREMIA  . Tramadol     Pt had hallucination while on it  . Sulfa Antibiotics Other (See Comments)    UNSPECIFIED REACTION FROM CHILDHOOD     REVIEW OF SYSTEMS:  GENERAL: no change in appetite, no fatigue, no weight changes, no fever, chills or weakness EYES: Denies change in vision, dry eyes, eye pain, itching or discharge EARS: Denies change in hearing, ringing in ears, or earache NOSE: Denies nasal congestion or epistaxis MOUTH and THROAT: Denies oral discomfort, gingival pain or bleeding, pain from teeth or  hoarseness   RESPIRATORY: no cough, SOB, DOE, wheezing, hemoptysis CARDIAC: no chest pain, edema or palpitations GI: no abdominal pain, diarrhea, constipation, heart burn, nausea or vomiting GU: Denies dysuria, frequency, hematuria, incontinence, or discharge PSYCHIATRIC: Denies feeling of depression or anxiety. No report of hallucinations, insomnia, paranoia, or agitation    PHYSICAL EXAMINATION  GENERAL APPEARANCE: Well nourished. In no acute distress. Normal body habitus SKIN:  Right side of chest has 3 surgical incision is dry and no erythema; there are 2  Large surgical sutures HEAD: Normal in size and contour. No evidence of trauma EYES: Lids open and close normally. No blepharitis, entropion or ectropion. PERRL. Conjunctivae are clear and sclerae are white. Lenses are without opacity EARS: Pinnae are normal. Patient hears normal voice tunes of the examiner MOUTH and THROAT: Lips are without lesions. Oral mucosa is moist and without lesions. Tongue is normal in shape, size, and color and without lesions NECK: supple, trachea midline, no neck masses, no thyroid tenderness, no thyromegaly LYMPHATICS: no LAN in the neck, no supraclavicular LAN RESPIRATORY: breathing is even & unlabored, BS CTAB CARDIAC: RRR, no murmur,no extra heart sounds, no edema GI: abdomen soft, normal BS, no masses, no tenderness, no hepatomegaly, no splenomegaly EXTREMITIES:  Able to move X 4 extremities PSYCHIATRIC: Alert and oriented X 3. Affect and behavior are appropriate  LABS/RADIOLOGY: Labs reviewed: Basic Metabolic Panel:  Recent Labs  07/30/16 0412 07/30/16 1700  08/03/16 0309 08/04/16 0351 08/05/16 0338 08/10/16  NA 133*  --   < > 136 136 135 140  K 3.5  --   < > 4.5 4.4 4.4 4.7  CL 104  --   < > 99* 98* 98*  --   CO2 24  --   < > 32 32 31  --   GLUCOSE 129*  --   < > 111* 97 116*  --   BUN 5*  --   < > 8 6 7 10   CREATININE 0.43* 0.71  < > 0.55 0.47 0.47 0.5  CALCIUM 6.8*  --   < > 8.4*  8.6* 8.8*  --   MG 1.9 2.0  --  2.1  --   --   --   < > = values in this interval not displayed. Liver Function Tests:  Recent Labs  07/24/16 1349 08/10/16  AST 25 16  ALT 22 18  ALKPHOS 51 67  BILITOT 0.8  --   PROT 6.9  --   ALBUMIN 4.2  --    CBC:  Recent Labs  07/31/16 0358 08/01/16 0423 08/02/16 0407 08/10/16  WBC 18.0* 20.3* 12.2* 11.6  NEUTROABS  --   --  10.5* 9  HGB 9.0* 9.8* 9.2* 10.3*  HCT  28.0* 28.6* 27.7* 32*  MCV 93.3 90.2 90.5  --   PLT 84* 100* 106* 333   CBG:  Recent Labs  07/30/16 2350 07/31/16 0346 07/31/16 0816  GLUCAP 98 107* 98      Dg Chest 2 View  Result Date: 08/05/2016 CLINICAL DATA:  Right pneumothorax. Status post mitral valve repair. EXAM: CHEST  2 VIEW COMPARISON:  Chest radiograph 08/04/2016 FINDINGS: Small right apical pneumothorax is unchanged. Prosthetic mitral valve again noted. Blunting of the right costophrenic angle is unchanged, small pleural effusion versus pleural scarring. There is no focal airspace consolidation or pulmonary edema. Cardiomediastinal contours are normal. IMPRESSION: Unchanged right apical pneumothorax. Electronically Signed   By: Ulyses Jarred M.D.   On: 08/05/2016 05:59   Dg Chest 2 View  Result Date: 08/04/2016 CLINICAL DATA:  Status post mitral valve repair five days ago. Right chest wall discomfort from chest tubes. EXAM: CHEST  2 VIEW COMPARISON:  Portable chest x-ray of July 31, 2016 FINDINGS: There is an approximately 5% right apical pneumothorax. There is a small right pleural effusion. There is subcutaneous emphysema on the right. On the left the lung is well-expanded. There is minimal subsegmental atelectasis at the lung base. There it is a trace of pleural fluid on the left. The heart and pulmonary vascularity are normal. The valve ring in the mitral position is stable in appearance. The pulmonary vascularity is normal. There is calcification in the wall of the aortic arch. IMPRESSION: New 5%  right apical pneumothorax since removal of the chest tubes. Underlying emphysema. No CHF. Small bilateral pleural effusions, stable. Critical Value/emergent results were called by telephone at the time of interpretation on 08/04/2016 at 8:11 am to Philippa Sicks, RN, who verbally acknowledged these results. Electronically Signed   By: David  Martinique M.D.   On: 08/04/2016 08:12   Dg Chest 2 View  Result Date: 07/24/2016 CLINICAL DATA:  Preoperative examination prior to mitral valve procedure. EXAM: CHEST  2 VIEW COMPARISON:  Chest CT scan of July 24, 2016 FINDINGS: The lungs are well-expanded. There are coarse lung markings in the right middle lobe anteriorly. There is hazy increased density obscuring a portion of the left heart border. The heart and pulmonary vascularity are normal. The mediastinum is normal in width. There is no pleural effusion. The bony thorax is unremarkable. IMPRESSION: Atelectasis or infiltrate in the anterior medial aspect of the right middle lobe and in the lingula. No CHF. Followup PA and lateral chest X-ray is recommended in 3-4 weeks following trial of antibiotic therapy to ensure resolution and exclude underlying malignancy. Electronically Signed   By: David  Martinique M.D.   On: 07/24/2016 14:29   Dg Chest Port 1 View  Result Date: 07/31/2016 CLINICAL DATA:  Chest tube. EXAM: PORTABLE CHEST 1 VIEW COMPARISON:  07/30/2016 . FINDINGS: Left IJ sheath in stable position. Interim removal of Swan-Ganz catheter. Right chest tube and mediastinal drainage catheters are in stable position. Prior cardiac valve replacement. Stable cardiomegaly. Persistent left base infiltrate and/or edema. Interim partial clearing of right base infiltrate/edema. Low lung volumes with bibasilar atelectasis again noted No prominent pleural effusion or pneumothorax. Mild right chest wall subcutaneous emphysema noted. IMPRESSION: 1. Interim removal Swan-Ganz catheter. Left IJ sheath, right chest tube, and right  mediastinal drainage catheter in stable position. No pneumothorax. 2. Persistent left base infiltrate and/or edema. Interim partial clearing of right base infiltrate/edema. Low lung volumes with basilar atelectasis. 3. Prior cardiac valve replacement. Stable cardiomegaly . No pulmonary venous congestion.  Electronically Signed   By: Marcello Moores  Register   On: 07/31/2016 07:47   Dg Chest Port 1 View  Result Date: 07/30/2016 CLINICAL DATA:  Chest tube.  CABG. EXAM: PORTABLE CHEST 1 VIEW COMPARISON:  07/29/2016. FINDINGS: Interim extubation and removal of NG tube. Swan-Ganz catheter, right chest tube, right mediastinal drainage catheter in stable position in stable position. Prior cardiac valve replacement. Mild cardiomegaly. Bibasilar pulmonary infiltrates and/or pulmonary edema noted. Small bilateral pleural effusions. No pneumothorax. IMPRESSION: 1. Interim extubation removal of NG tube. Remaining tubes including right chest tube and right mediastinal drainage catheter stable position. No pneumothorax. 2. Prior cardiac valve replacement. Cardiomegaly with bilateral basilar pulmonary alveolar infiltrates and/or pulmonary edema. Small bilateral pleural effusions. Electronically Signed   By: Marcello Moores  Register   On: 07/30/2016 07:37   Dg Chest Port 1 View  Result Date: 07/29/2016 CLINICAL DATA:  Status post mitral valve repair EXAM: PORTABLE CHEST 1 VIEW COMPARISON:  07/24/2016 FINDINGS: Cardiac shadow is stable. A Swan-Ganz catheter is noted in the right pulmonary artery. An endotracheal tube and nasogastric catheter are seen in satisfactory position. Right thoracostomy catheter extends to the right apex. A right pericardial drain extends towards the cardiac apex. No pneumothorax is seen. No focal infiltrate is noted. IMPRESSION: Postoperative changes with tubes and lines as described above. No pneumothorax or focal infiltrate is seen. Electronically Signed   By: Inez Catalina M.D.   On: 07/29/2016 15:52   Ct Angio  Chest Aorta W &/or Wo Contrast  Result Date: 07/24/2016 CLINICAL DATA:  Preop mitral valve repair. History of breast carcinoma post left mastectomy. EXAM: CT ANGIOGRAPHY CHEST, ABDOMEN, PELVIS WITH CONTRAST TECHNIQUE: Multidetector CT imaging of the chest, abdomen, pelvis was performed using the standard protocol during bolus administration of intravenous contrast. Multiplanar CT image reconstructions and MIPs were obtained to evaluate the vascular anatomy. CONTRAST:  75 mL Isovue 370 IV COMPARISON:  CT chest 09/20/2015 FINDINGS: CHEST Vascular: Right arm IV contrast administration. SVC patent. Satisfactory opacification of pulmonary arteries noted, and there is no evidence of pulmonary emboli. Patent bilateral pulmonary veins drain into the left atrium. Mild left atrial enlargement. No left atrial appendage thrombus. Scattered coronary calcifications. Adequate contrast opacification of the thoracic aorta with no evidence of dissection, aneurysm, or stenosis. There is classic 3-vessel brachiocephalic arch anatomy without proximal stenosis. Minimal plaque at the origin of the left subclavian artery without stenosis. Otherwise no significant atheromatous irregularity. Mediastinum/Lymph Nodes: No pericardial effusion. No masses or pathologically enlarged lymph nodes identified. Lungs/Pleura: Linear scarring or subsegmental atelectasis in the inferior aspect of the lingula and right middle lobe, slightly increased since previous exam. Lungs otherwise clear. No effusion. No pneumothorax. Musculoskeletal: No chest wall mass or suspicious bone lesions identified. ABDOMEN Arterial Aorta: No aneurysm, dissection, or stenosis. Mild partially calcified plaque in the infrarenal segment. Celiac axis: Separate origins of the common hepatic artery and splenic artery, both widely patent. Left gastric arises from the splenic artery. 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 Superior mesenteric:  Patent, classic distal branch anatomy. Left renal: Single, patent, with mild nonocclusive calcified ostial plaque. Right renal:          Single, patent Inferior mesenteric:  Patent Left iliac: Minimal calcified plaque at the origin of the common iliac. No aneurysm or stenosis. No significant tortuosity. Right iliac: Scattered calcified nonocclusive plaque in the common iliac. No aneurysm or stenosis. No significant tortuosity. Venous Dedicated venous phase  imaging not obtained. Patent bilateral renal veins, splenic vein, and portal vein noted. Review of the MIP images confirms the above findings. Nonvasular Hepatobiliary: No masses or other significant abnormality. Pancreas: No mass, inflammatory changes, or other significant abnormality. Spleen: Within normal limits in size and appearance. Adrenals/Urinary Tract: No masses identified. No evidence of hydronephrosis. Mild wall thickening of the urinary bladder which is physiologically distended. Stomach/Bowel: No evidence of obstruction, inflammatory process, or abnormal fluid collections. Lymphatic: No pathologically enlarged lymph nodes. Reproductive: Coarse calcification in the anterior uterine body probably degenerated fibroid. Otherwise unremarkable. Other: No ascites.  No free air. Musculoskeletal: Early degenerative spurring in bilateral hips. Negative for fracture. IMPRESSION: 1. Mild aortoiliac plaque without aneurysm, stenosis, or significant tortuosity. 2. Coronary calcifications 3. Some minimal increase in patchy infiltrate or subsegmental atelectasis in the inferior lingula and right middle lobe. Electronically Signed   By: Lucrezia Europe M.D.   On: 07/24/2016 11:47   Ct Angio Abd/pel W/ And/or W/o  Result Date: 07/24/2016 CLINICAL DATA:  Preop mitral valve repair. History of breast carcinoma post left mastectomy. EXAM: CT ANGIOGRAPHY CHEST, ABDOMEN, PELVIS WITH CONTRAST TECHNIQUE: Multidetector CT imaging of the  chest, abdomen, pelvis was performed using the standard protocol during bolus administration of intravenous contrast. Multiplanar CT image reconstructions and MIPs were obtained to evaluate the vascular anatomy. CONTRAST:  75 mL Isovue 370 IV COMPARISON:  CT chest 09/20/2015 FINDINGS: CHEST Vascular: Right arm IV contrast administration. SVC patent. Satisfactory opacification of pulmonary arteries noted, and there is no evidence of pulmonary emboli. Patent bilateral pulmonary veins drain into the left atrium. Mild left atrial enlargement. No left atrial appendage thrombus. Scattered coronary calcifications. Adequate contrast opacification of the thoracic aorta with no evidence of dissection, aneurysm, or stenosis. There is classic 3-vessel brachiocephalic arch anatomy without proximal stenosis. Minimal plaque at the origin of the left subclavian artery without stenosis. Otherwise no significant atheromatous irregularity. Mediastinum/Lymph Nodes: No pericardial effusion. No masses or pathologically enlarged lymph nodes identified. Lungs/Pleura: Linear scarring or subsegmental atelectasis in the inferior aspect of the lingula and right middle lobe, slightly increased since previous exam. Lungs otherwise clear. No effusion. No pneumothorax. Musculoskeletal: No chest wall mass or suspicious bone lesions identified. ABDOMEN Arterial Aorta: No aneurysm, dissection, or stenosis. Mild partially calcified plaque in the infrarenal segment. Celiac axis: Separate origins of the common hepatic artery and splenic artery, both widely patent. Left gastric arises from the splenic artery. 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 Superior mesenteric:  Patent, classic distal branch anatomy. Left renal: Single, patent, with mild nonocclusive calcified ostial plaque. Right renal:          Single, patent Inferior mesenteric:  Patent Left iliac: Minimal calcified  plaque at the origin of the common iliac. No aneurysm or stenosis. No significant tortuosity. Right iliac: Scattered calcified nonocclusive plaque in the common iliac. No aneurysm or stenosis. No significant tortuosity. Venous Dedicated venous phase imaging not obtained. Patent bilateral renal veins, splenic vein, and portal vein noted. Review of the MIP images confirms the above findings. Nonvasular Hepatobiliary: No masses or other significant abnormality. Pancreas: No mass, inflammatory changes, or other significant abnormality. Spleen: Within normal limits in size and appearance. Adrenals/Urinary Tract: No masses identified. No evidence of hydronephrosis. Mild wall thickening of the urinary bladder which is physiologically distended. Stomach/Bowel: No evidence of obstruction, inflammatory process, or abnormal fluid collections. Lymphatic: No pathologically enlarged lymph nodes. Reproductive: Coarse calcification in  the anterior uterine body probably degenerated fibroid. Otherwise unremarkable. Other: No ascites.  No free air. Musculoskeletal: Early degenerative spurring in bilateral hips. Negative for fracture. IMPRESSION: 1. Mild aortoiliac plaque without aneurysm, stenosis, or significant tortuosity. 2. Coronary calcifications 3. Some minimal increase in patchy infiltrate or subsegmental atelectasis in the inferior lingula and right middle lobe. Electronically Signed   By: Lucrezia Europe M.D.   On: 07/24/2016 11:47    ASSESSMENT/PLAN:  Physical deconditioning - for outpatient care; fall precautions  MVP with MR - S/P Mitral valve repair - will follow-up with cardiothoracic surgeon on 08/13/16; Coumadin on hold today due to supratherapeutic INR   Long-term use of anticoagulant - INR 3.1, supratherapeutic, no bleeding; hold Coumadin and check INR 08/12/16, before discharge  Chest wall pain - pain is well-controlled; continue Tylenol 325 mg 2 tabs = 650 mg by mouth every 6 hours when necessary  Acute blood  loss anemia - stable Lab Results  Component Value Date   HGB 10.3 (A) 08/10/2016   Leukocytosis - trending down; no source of infection Lab Results  Component Value Date   WBC 11.6 08/10/2016   Hyperlipidemia - continue atorvastatin 10 mg 1 tab by mouth daily  Hypertension - well controlled; continue metoprolol tartrate 25 mg 1/2 tab = 12.5 mg by mouth twice a day and lisinopril 2.5 mg 1 tab by mouth daily  Allergic rhinitis - continue Zyrtec 10 mg 1 tab by mouth daily at bedtime     I have filled out patient's discharge paperwork and written prescriptions.  Patient will have outpatient care.  DME provided:  None  Total discharge time: Less than 30 minutes  Discharge time involved coordination of the discharge process with social worker, nursing staff and therapy department.      Durenda Age, NP [employer] [phone]

## 2016-08-12 ENCOUNTER — Other Ambulatory Visit: Payer: Self-pay | Admitting: Thoracic Surgery (Cardiothoracic Vascular Surgery)

## 2016-08-12 DIAGNOSIS — Z9889 Other specified postprocedural states: Secondary | ICD-10-CM

## 2016-08-13 ENCOUNTER — Ambulatory Visit (INDEPENDENT_AMBULATORY_CARE_PROVIDER_SITE_OTHER): Payer: Self-pay | Admitting: Thoracic Surgery (Cardiothoracic Vascular Surgery)

## 2016-08-13 ENCOUNTER — Encounter: Payer: Self-pay | Admitting: Thoracic Surgery (Cardiothoracic Vascular Surgery)

## 2016-08-13 ENCOUNTER — Ambulatory Visit
Admission: RE | Admit: 2016-08-13 | Discharge: 2016-08-13 | Disposition: A | Discharge status: Home / Self Care | Payer: Medicare Other | Source: Ambulatory Visit | Attending: Thoracic Surgery (Cardiothoracic Vascular Surgery) | Admitting: Thoracic Surgery (Cardiothoracic Vascular Surgery)

## 2016-08-13 VITALS — BP 106/74 | HR 85 | Resp 18 | Ht 62.5 in | Wt 103.0 lb

## 2016-08-13 DIAGNOSIS — Z9889 Other specified postprocedural states: Secondary | ICD-10-CM

## 2016-08-13 DIAGNOSIS — I341 Nonrheumatic mitral (valve) prolapse: Secondary | ICD-10-CM

## 2016-08-13 DIAGNOSIS — I340 Nonrheumatic mitral (valve) insufficiency: Secondary | ICD-10-CM

## 2016-08-13 NOTE — Patient Instructions (Signed)
Continue all previous medications without any changes at this time  Recheck INR tomorrow or Monday at the latest  You may continue to gradually increase your physical activity as tolerated.  Refrain from any heavy lifting or strenuous use of your arms and shoulders until at least 8 weeks from the time of your surgery, and avoid activities that cause increased pain in your chest on the side of your surgical incision.  Otherwise you may continue to increase activities without any particular limitations.  Increase the intensity and duration of physical activity gradually.  You may return to driving an automobile as long as you are no longer requiring oral narcotic pain relievers during the daytime.  It would be wise to start driving only short distances during the daylight and gradually increase from there as you feel comfortable.  You are encouraged to enroll and participate in the outpatient cardiac rehab program beginning as soon as practical.

## 2016-08-13 NOTE — Progress Notes (Signed)
[address]       [place], [address]             [phone]     CARDIOTHORACIC SURGERY OFFICE NOTE  Referring Provider is Larey Dresser, MD PCP is Orpah Cobb, MD   HPI:  Patient returns to the office today for routine follow-up status post minimally invasive mitral valve repair on 07/29/2016 for mitral valve prolapse with severe symptomatic mitral regurgitation. Her postoperative recovery was uneventful and she was discharged from the hospital on the seventh postoperative day to a local skilled nursing facility for rehabilitation. She returns to our office for routine follow-up. She has done remarkably well since hospital discharge. She has had her prothrombin time checked on 2 occasions. Initially she was supratherapeutic and her Coumadin was held for 2 days and restarted at a lower dose. It was rechecked yesterday and her INR was reportedly 1.8. Clinically the patient has made excellent progress. She states that she is ambulating and of fairly brisk pace and getting around quite well without any problems. She still has some soreness in her right posterior chest and shoulder related to her surgery, but this only really bothers her at night and she has been controlling it using Tylenol. She has not required any other pain medications. She has no palpitations or dizzy spells. Appetite is good. She is delighted with her progress and eager to return home.   Current Outpatient Prescriptions  Medication Sig Dispense Refill  . acetaminophen (TYLENOL) 325 MG tablet Take 2 tablets (650 mg total) by mouth every 6 (six) hours as needed for mild pain, fever or headache.    Marland Kitchen aspirin EC 81 MG EC tablet Take 1 tablet (81 mg total) by mouth daily.    Marland Kitchen atorvastatin (LIPITOR) 10 MG tablet Take 10 mg by mouth daily at 6 PM.     . Calcium Carbonate-Vitamin D (CALTRATE 600+D PO) Take 1 tablet by mouth daily.    . cetirizine (ZYRTEC) 10 MG chewable tablet Chew 10 mg by  mouth at bedtime.     Marland Kitchen lisinopril (PRINIVIL,ZESTRIL) 2.5 MG tablet Take 1 tablet (2.5 mg total) by mouth daily. 30 tablet 1  . metoprolol tartrate (LOPRESSOR) 25 MG tablet Take 0.5 tablets (12.5 mg total) by mouth 2 (two) times daily. 30 tablet 1  . Multiple Vitamin (MULTIVITAMIN WITH MINERALS) TABS tablet Take 1 tablet by mouth daily.    Marland Kitchen warfarin (COUMADIN) 4 MG tablet Take 4 mg by mouth daily at 6 PM.     No current facility-administered medications for this visit.       Physical Exam:   BP 106/74 (BP Location: Right Arm, Patient Position: Sitting, Cuff Size: Small)   Pulse 85   Resp 18   Ht 5' 2.5" (1.588 m)   Wt 103 lb (46.7 kg)   SpO2 95% Comment: RA  BMI 18.54 kg/m   General:  Well-appearing  Chest:   Clear to auscultation  CV:   Regular rate and rhythm without murmur  Incisions:  Healing nicely, chest tube sutures are removed  Abdomen:  Soft nontender  Extremities:  Warm and well-perfused  Diagnostic Tests:  CHEST  2 VIEW  COMPARISON:  08/05/2016  FINDINGS: Cardiomediastinal silhouette is stable. No acute infiltrate or pleural effusion. No pulmonary edema. Mitral valve prosthesis again noted.  IMPRESSION: No active cardiopulmonary disease.   Electronically Signed   By: Lahoma Crocker M.D.   On: 08/13/2016 13:05  Impression:  Patient is doing very well approximately 2 weeks status post minimally invasive mitral valve repair.   Plan:  I have encouraged the patient to continue to gradually increase her activity as tolerated. She has been reminded to refrain from any heavy lifting, strenuous use of her arms or shoulders, or reaching above her head with the right arm for at least a few more weeks. I have encouraged her to enroll and participate in the cardiac rehabilitation program to her local hospital. I think she may resume driving an automobile but I cautioned her to only drive short distances during the daylight hours for the time being. We have  not changed any of her medications. She will get her prothrombin time rechecked either tomorrow or Monday at the Coumadin clinic near her local hospital. All of her questions have been addressed. The patient will return to our office for routine follow-up in approximately 4-6 weeks.   Valentina Gu. Roxy Manns, MD 08/13/2016 1:32 PM

## 2016-08-14 ENCOUNTER — Encounter: Payer: Self-pay | Admitting: *Deleted

## 2016-08-27 ENCOUNTER — Encounter: Payer: Self-pay | Admitting: *Deleted

## 2016-08-27 NOTE — Progress Notes (Signed)
Laura Galvan has been referred to [facility] in [place], [place]. s/p [facility] on 07/28/16 per her request and also, Dr. Darylene Price. All requested records were faxed.

## 2016-08-31 ENCOUNTER — Ambulatory Visit: Payer: Medicare Other | Admitting: Thoracic Surgery (Cardiothoracic Vascular Surgery)

## 2016-09-09 ENCOUNTER — Other Ambulatory Visit: Payer: Self-pay | Admitting: Adult Health

## 2016-09-14 ENCOUNTER — Encounter: Payer: Self-pay | Admitting: Thoracic Surgery (Cardiothoracic Vascular Surgery)

## 2016-09-14 ENCOUNTER — Ambulatory Visit (INDEPENDENT_AMBULATORY_CARE_PROVIDER_SITE_OTHER): Payer: Self-pay | Admitting: Thoracic Surgery (Cardiothoracic Vascular Surgery)

## 2016-09-14 VITALS — HR 92 | Resp 16 | Ht 62.5 in | Wt 98.0 lb

## 2016-09-14 DIAGNOSIS — I341 Nonrheumatic mitral (valve) prolapse: Secondary | ICD-10-CM

## 2016-09-14 DIAGNOSIS — Z9889 Other specified postprocedural states: Secondary | ICD-10-CM

## 2016-09-14 DIAGNOSIS — I340 Nonrheumatic mitral (valve) insufficiency: Secondary | ICD-10-CM

## 2016-09-14 NOTE — Patient Instructions (Signed)
Continue all previous medications without any changes at this time  Stop taking warfarin 3 months after your surgery  You may resume unrestricted physical activity without any particular limitations at this time.  Endocarditis is a potentially serious infection of heart valves or inside lining of the heart.  It occurs more commonly in patients with diseased heart valves (such as patient's with aortic or mitral valve disease) and in patients who have undergone heart valve repair or replacement.  Certain surgical and dental procedures may put you at risk, such as dental cleaning, other dental procedures, or any surgery involving the respiratory, urinary, gastrointestinal tract, gallbladder or prostate gland.   To minimize your chances for develooping endocarditis, maintain good oral health and seek prompt medical attention for any infections involving the mouth, teeth, gums, skin or urinary tract.    Always notify your doctor or dentist about your underlying heart valve condition before having any invasive procedures. You will need to take antibiotics before certain procedures, including all routine dental cleanings or other dental procedures.  Your cardiologist or dentist should prescribe these antibiotics for you to be taken ahead of time.

## 2016-09-14 NOTE — Progress Notes (Signed)
[address]       [place] [address]             [phone]     CARDIOTHORACIC SURGERY OFFICE NOTE  Referring Provider is Larey Dresser, MD PCP is Orpah Cobb, MD   HPI:  Patient is a 70 year old female who returns to the office today for routine follow-up status post minimally invasive mitral valve repair on 07/29/2016 for mitral valve prolapse with severe symptomatic mitral regurgitation. Her postoperative recovery has been uneventful.  She was last seen here in our office on 08/13/2016 at which time she was doing well. Since then she has returned home to [place]. She has followed up with her primary care physician who has been managing her anticoagulation with warfarin. She states that her most recent INR was 2.2.  Overall she reports that she is doing exceptionally well. She is now back to essentially normal physical activity. She has enrolled in the outpatient cardiac rehabilitation program close to home.  She is driving a car in doing all of her typical chores around the house without any significant limitations. She has no significant pain in her chest. 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  Appetite is good and she is sleeping well at night. She is walking every day and her breathing is normal.   Current Outpatient Prescriptions  Medication Sig Dispense Refill  . acetaminophen (TYLENOL) 325 MG tablet Take 2 tablets (650 mg total) by mouth every 6 (six) hours as needed for mild pain, fever or headache.    Marland Kitchen aspirin EC 81 MG EC tablet Take 1 tablet (81 mg total) by mouth daily.    Marland Kitchen atorvastatin (LIPITOR) 10 MG tablet Take 10 mg by mouth daily at 6 PM.     . Calcium Carbonate-Vitamin D (CALTRATE 600+D PO) Take 1 tablet by mouth daily.    . cetirizine (ZYRTEC) 10 MG chewable tablet Chew 10 mg by mouth at bedtime.     Marland Kitchen lisinopril  (PRINIVIL,ZESTRIL) 2.5 MG tablet Take 1 tablet (2.5 mg total) by mouth daily. 30 tablet 1  . metoprolol tartrate (LOPRESSOR) 25 MG tablet Take 0.5 tablets (12.5 mg total) by mouth 2 (two) times daily. 30 tablet 1  . Multiple Vitamin (MULTIVITAMIN WITH MINERALS) TABS tablet Take 1 tablet by mouth daily.    Marland Kitchen warfarin (COUMADIN) 4 MG tablet Take 4 mg by mouth daily at 6 PM.     No current facility-administered medications for this visit.       Physical Exam:   Pulse 92   Resp 16   Ht 5' 2.5" (1.588 m)   Wt 98 lb (44.5 kg)   BMI 17.64 kg/m   General:  Well-appearing  Chest:   Clear to auscultation  CV:   Regular rate and rhythm without murmur  Incisions:  Healing nicely  Abdomen:  Soft nontender  Extremities:  Warm and well-perfused  Diagnostic Tests:  n/a   Impression:  Patient is doing very well proximally 6 weeks following minimally invasive mitral valve repair.   Plan:  I have encouraged the patient to continue to increase her physical activity without any particular limitations at this time. We have not recommended any changes to her current medications. I've instructed her that she may stop taking warfarin in approximately 6 weeks after a total duration of approximately 3 months from the  time of her surgery.   All of her questions have been addressed.  The patient has been reminded regarding the importance of dental hygiene and the lifelong need for antibiotic prophylaxis for all dental cleanings and other related invasive procedures.  I reassured her that the neurologic symptoms in her right arm would be expected to slowly improve over a period of several months. If this does not improve we can consider further evaluation and neurology consult at a later date.  Otherwise the patient will return to our office for routine follow-up next September, approximately 1 year following her original surgery. She will call and return sooner should specific problems or questions arise.        Valentina Gu. Roxy Manns, MD 09/14/2016 3:39 PM

## 2017-02-19 IMAGING — CR DG CHEST 1V PORT
1 series · 1 of 1 positions shown · non-contrast
Comparison: 07/30/2016 .

CLINICAL DATA: Chest tube.

EXAM:
PORTABLE CHEST 1 VIEW

[AP]
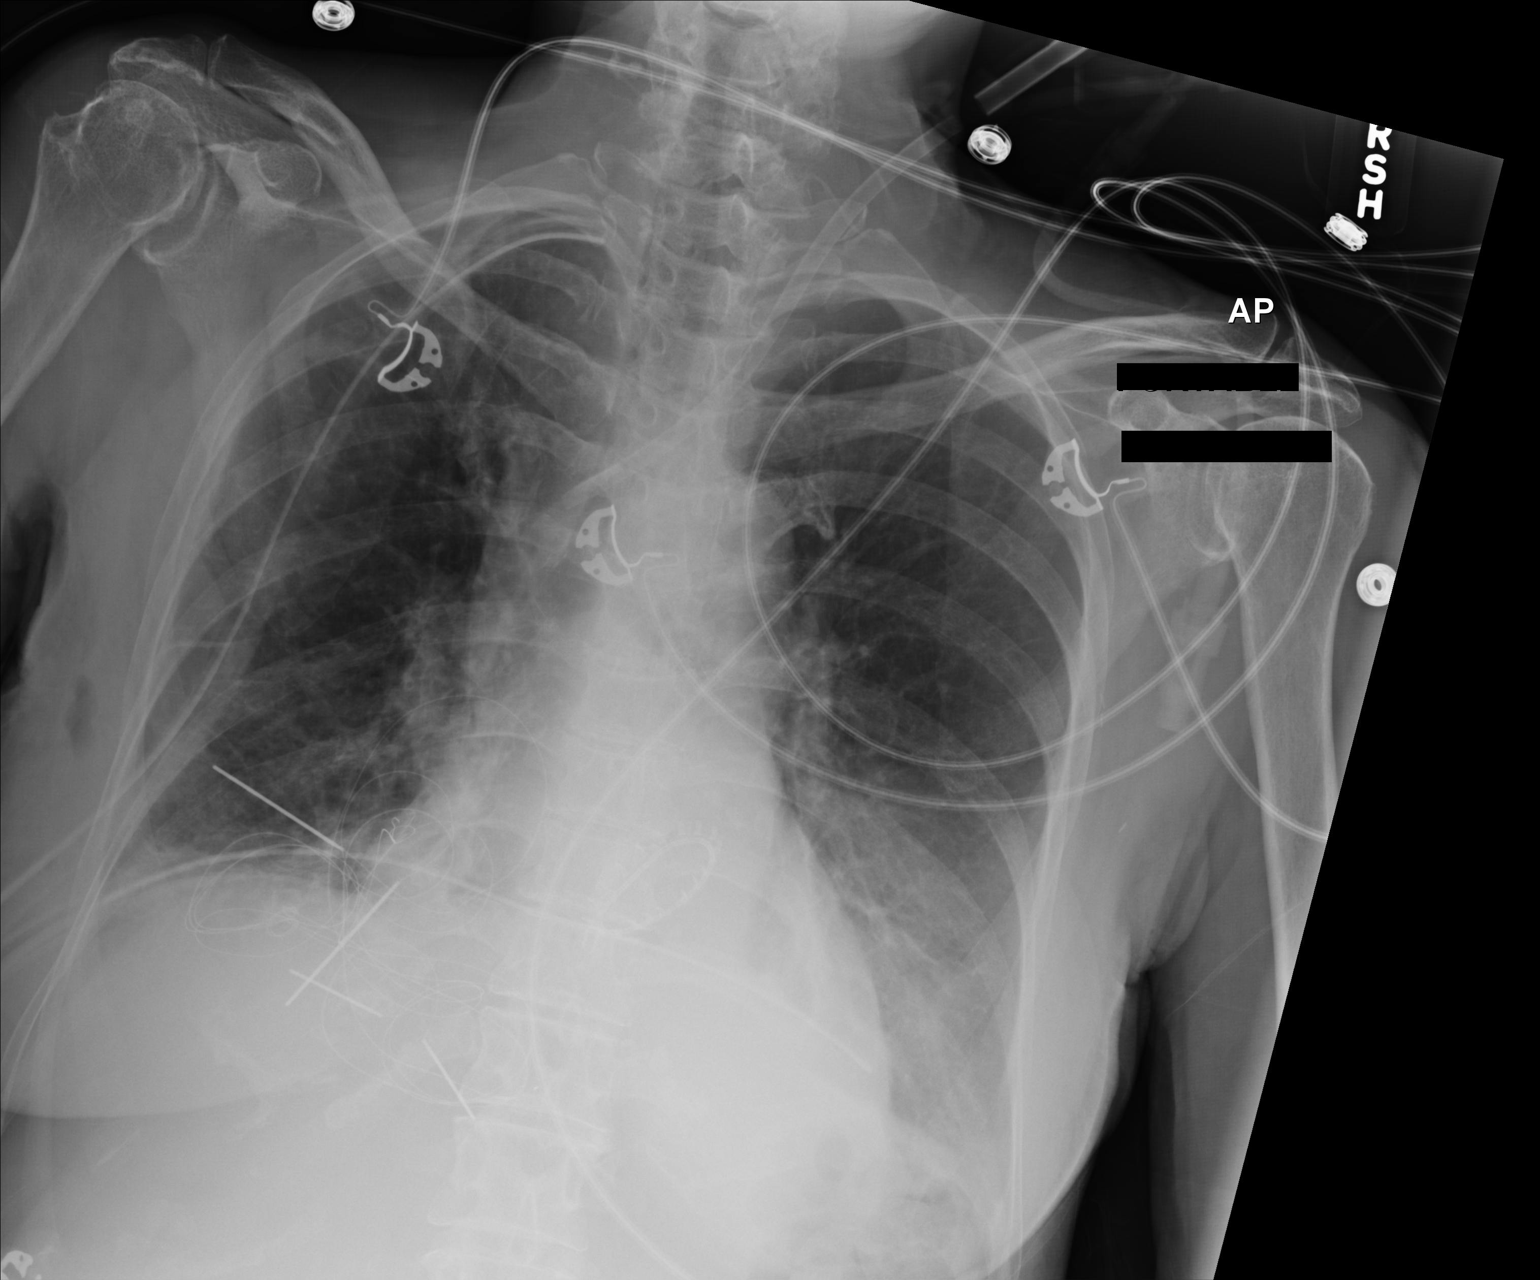

[1 of 1 positions shown; findings below may reference images not displayed]

FINDINGS: Left IJ sheath in stable position. Interim removal of Swan-Ganz
catheter. Right chest tube and mediastinal drainage catheters are in
stable position. Prior cardiac valve replacement. Stable
cardiomegaly. Persistent left base infiltrate and/or edema. Interim
partial clearing of right base infiltrate/edema. Low lung volumes
with bibasilar atelectasis again noted No prominent pleural effusion
or pneumothorax. Mild right chest wall subcutaneous emphysema noted.
IMPRESSION: 1. Interim removal Swan-Ganz catheter. Left IJ sheath, right chest
tube, and right mediastinal drainage catheter in stable position. No
pneumothorax.

2. Persistent left base infiltrate and/or edema. Interim partial
clearing of right base infiltrate/edema. Low lung volumes with
basilar atelectasis.

3. Prior cardiac valve replacement. Stable cardiomegaly . No
pulmonary venous congestion.

## 2017-03-04 IMAGING — CR DG CHEST 2V
2 series · 2 of 2 positions shown · non-contrast
Comparison: 08/05/2016

CLINICAL DATA: Status post mitral valve repair

EXAM:
CHEST  2 VIEW

[view not recorded (1 of 2)]
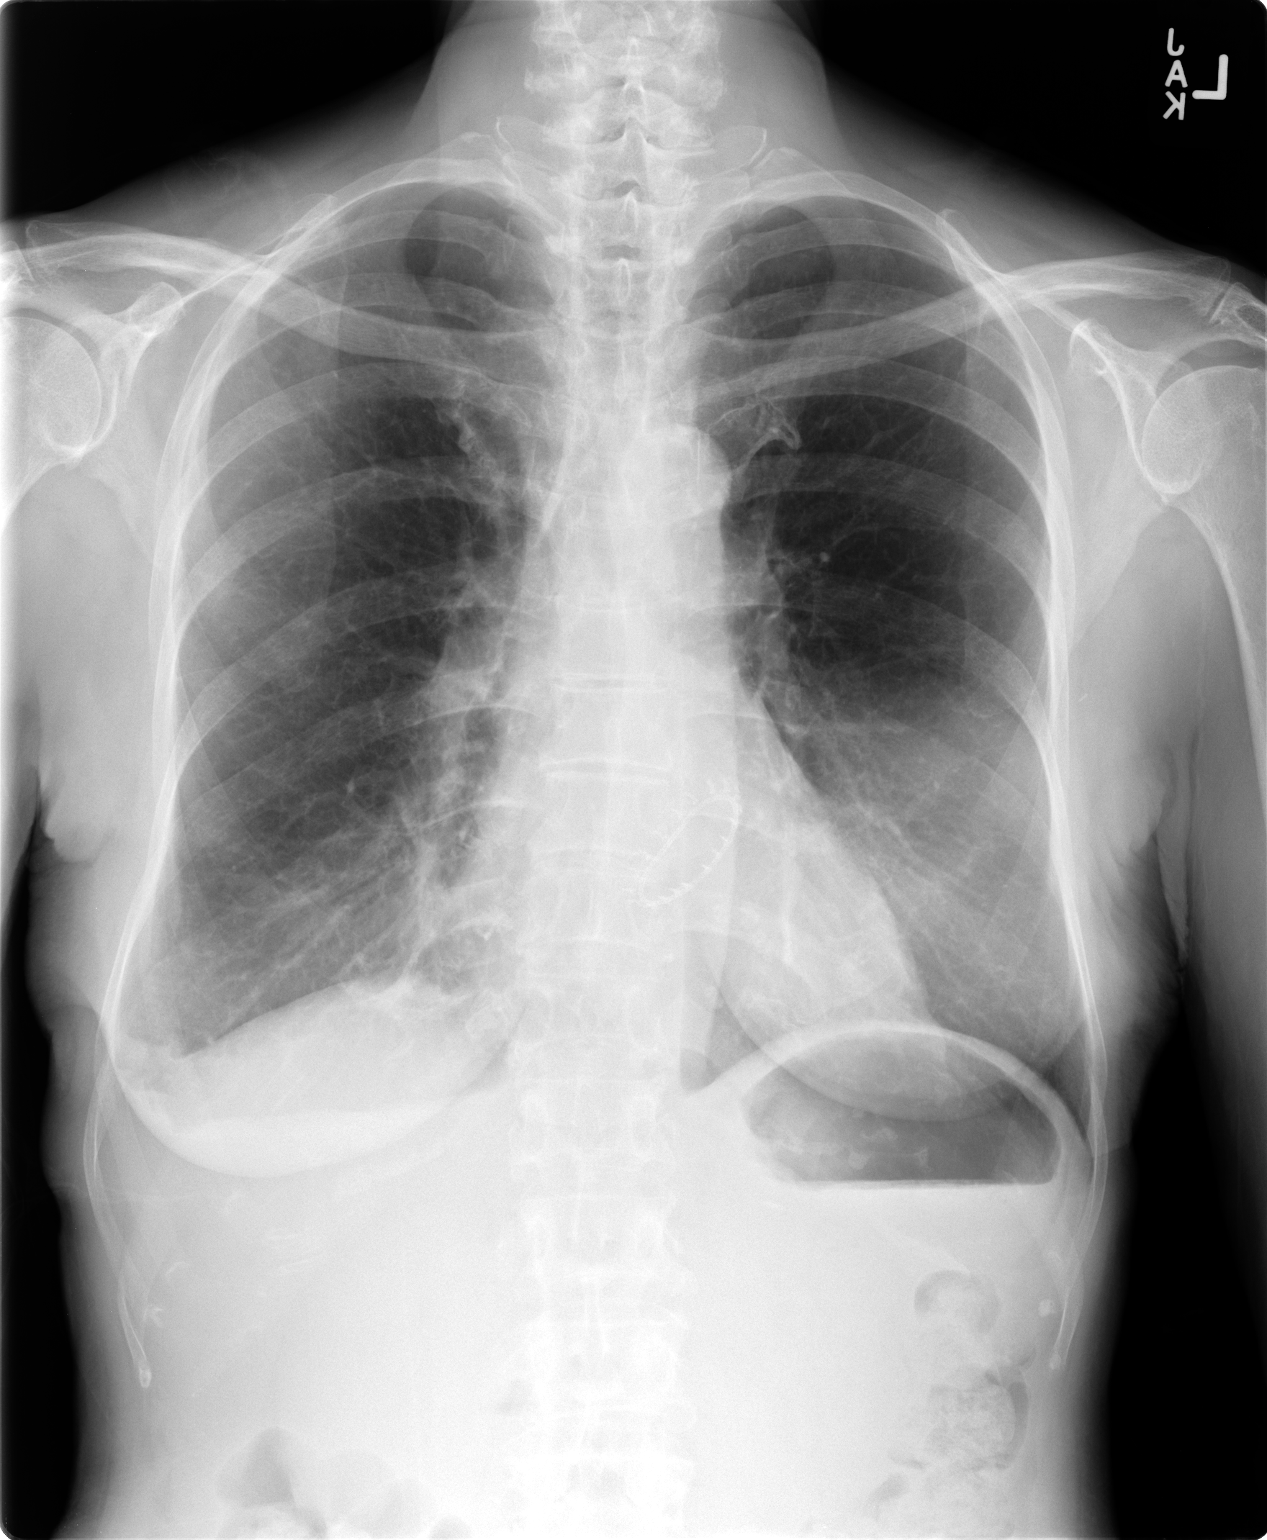

[view not recorded (2 of 2)]
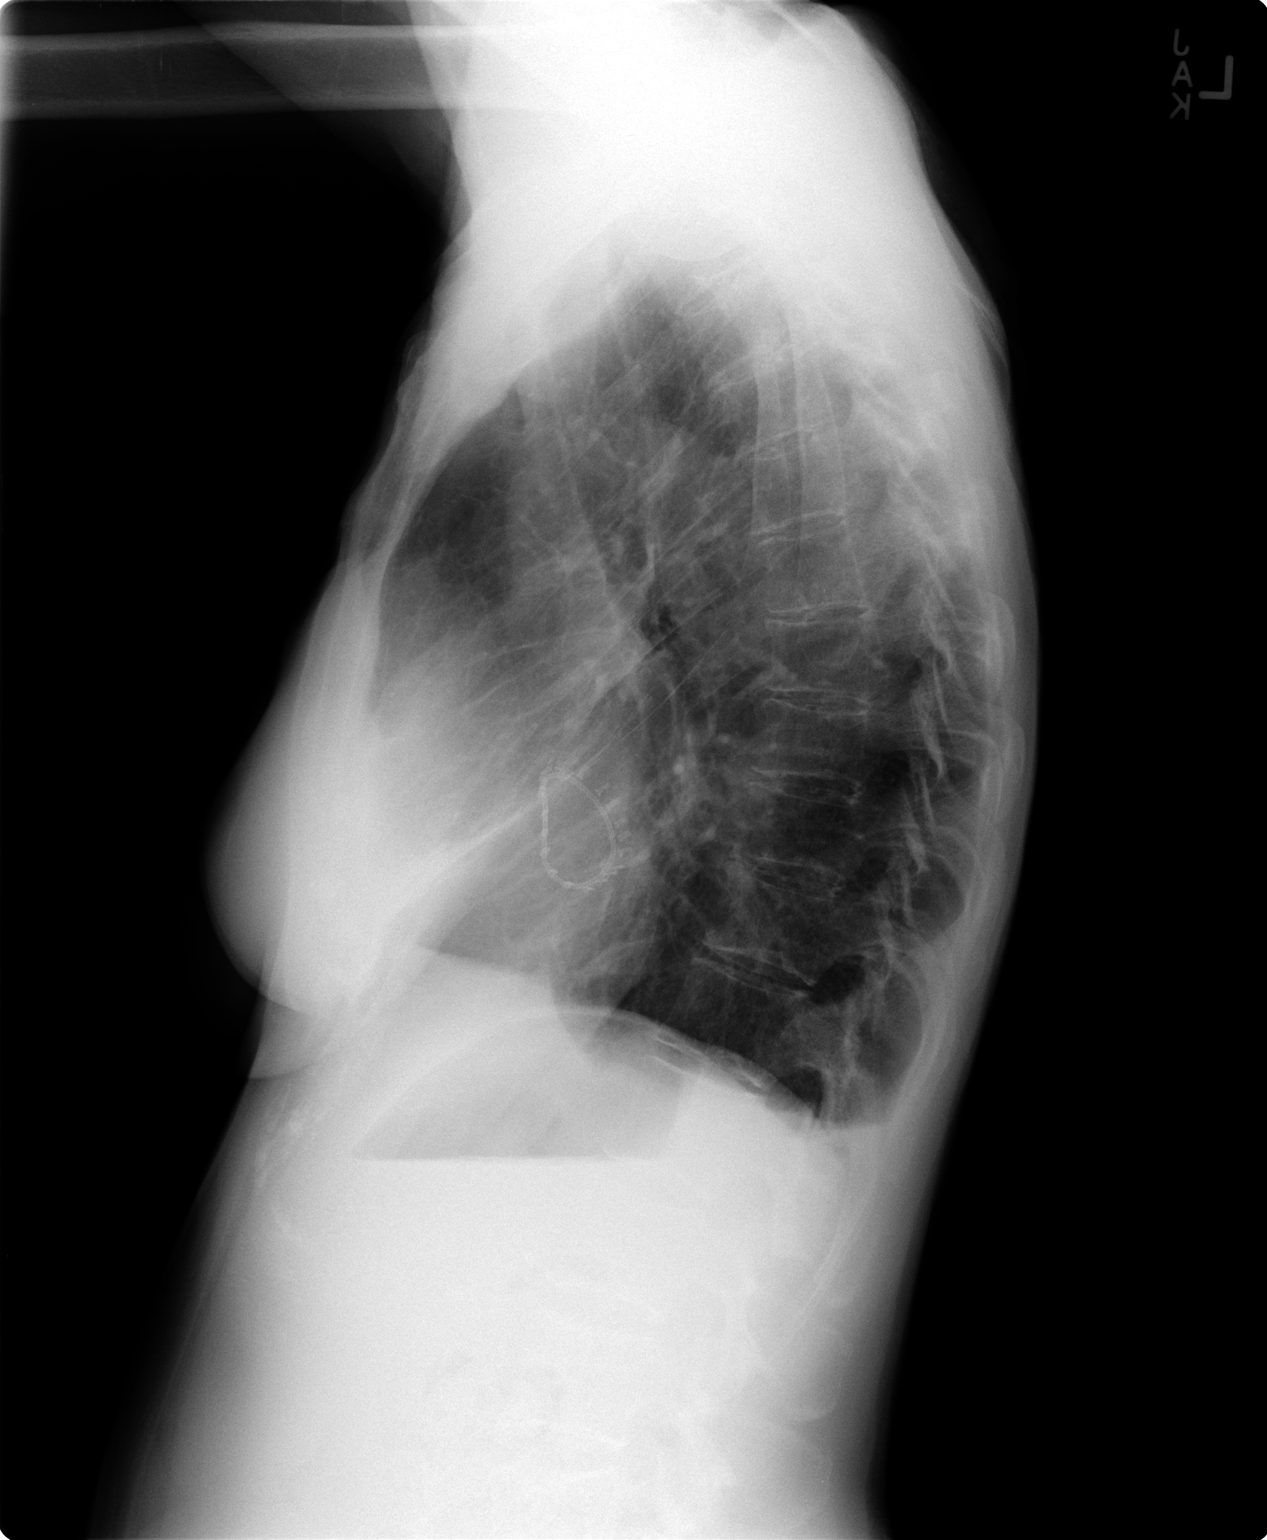

[2 of 2 positions shown; findings below may reference images not displayed]

FINDINGS: Cardiomediastinal silhouette is stable. No acute infiltrate or
pleural effusion. No pulmonary edema. Mitral valve prosthesis again
noted.
IMPRESSION: No active cardiopulmonary disease.

## 2017-03-23 DEATH — deceased

## 2017-08-16 ENCOUNTER — Ambulatory Visit (INDEPENDENT_AMBULATORY_CARE_PROVIDER_SITE_OTHER): Payer: Medicare Other | Admitting: Thoracic Surgery (Cardiothoracic Vascular Surgery)

## 2017-08-16 ENCOUNTER — Encounter: Payer: Self-pay | Admitting: Thoracic Surgery (Cardiothoracic Vascular Surgery)

## 2017-08-16 VITALS — BP 139/77 | HR 75 | Resp 18 | Ht 63.0 in | Wt 108.8 lb

## 2017-08-16 DIAGNOSIS — I340 Nonrheumatic mitral (valve) insufficiency: Secondary | ICD-10-CM

## 2017-08-16 DIAGNOSIS — Z9889 Other specified postprocedural states: Secondary | ICD-10-CM

## 2017-08-16 NOTE — Progress Notes (Signed)
      [address]       [place] [address]             [phone]     CARDIOTHORACIC SURGERY OFFICE NOTE  Referring Provider is Larey Dresser, MD  Primary Cardiologist is Dionne Ano, MD PCP is Orpah Cobb, MD   HPI:  Patient is a 71 year old female with history of mitral valve prolapse with severe mitral regurgitation who returns to the office today for routine follow-up approximately one year status post minimally invasive mitral valve repair on 07/29/2016.  Her early postoperative recovery was uneventful and routine follow-up echocardiogram prior to hospital discharge revealed normal left ventricular systolic function was intact mitral valve repair.  She was last seen here in our office on 09/14/2016. Since then she has done remarkably well. She has participated in the outpatient cardiac rehabilitation program in [place] where she lives. She continues to exercise regularly and she reports no physical limitations whatsoever. She specifically denies any symptoms of exertional shortness of breath or fatigue and her exercise tolerance is quite good. She states that prior to surgery she experienced occasional palpitations. Since her valve has been repaired she reports no longer having any symptoms of palpitations.   Current Outpatient Prescriptions  Medication Sig Dispense Refill  . acetaminophen (TYLENOL) 325 MG tablet Take 2 tablets (650 mg total) by mouth every 6 (six) hours as needed for mild pain, fever or headache.    Marland Kitchen aspirin EC 81 MG EC tablet Take 1 tablet (81 mg total) by mouth daily.    Marland Kitchen atorvastatin (LIPITOR) 10 MG tablet Take 10 mg by mouth daily at 6 PM.     . Calcium Carbonate-Vitamin D (CALTRATE 600+D PO) Take 1 tablet by mouth daily.    . cetirizine (ZYRTEC) 10 MG chewable tablet Chew 10 mg by mouth at bedtime.     . metoprolol tartrate (LOPRESSOR) 25 MG tablet Take 0.5 tablets (12.5 mg total) by mouth 2 (two) times daily. 30 tablet  1  . Multiple Vitamin (MULTIVITAMIN WITH MINERALS) TABS tablet Take 1 tablet by mouth daily.     No current facility-administered medications for this visit.       Physical Exam:   BP 139/77   Pulse 75   Resp 18   Ht 5\' 3"  (1.6 m)   Wt 108 lb 12.8 oz (49.4 kg)   SpO2 96% Comment: on RA  BMI 19.27 kg/m   General:  Well-appearing  Chest:   Clear to auscultation  CV:   Regular rate and rhythm without murmur  Incisions:  Completely healed  Abdomen:  Soft nontender  Extremities:  Warm and well-perfused  Diagnostic Tests:  n/a   Impression:  Patient is doing very well approximately one year status post minimally invasive mitral valve repair  Plan:  We have not recommended any changes to the patient's current medications.  The patient has been reminded regarding the importance of dental hygiene and the lifelong need for antibiotic prophylaxis for all dental cleanings and other related invasive procedures.  In the future she will call and return to see [place] only should specific problems or questions arise.   I spent in excess of 15 minutes during the conduct of this office consultation and >50% of this time involved direct face-to-face encounter with the patient for counseling and/or coordination of their care.   Valentina Gu. Roxy Manns, MD 08/16/2017 2:15 PM

## 2017-08-16 NOTE — Patient Instructions (Signed)

## 2017-12-01 ENCOUNTER — Other Ambulatory Visit: Payer: Self-pay | Admitting: Otolaryngology

## 2017-12-01 DIAGNOSIS — H903 Sensorineural hearing loss, bilateral: Secondary | ICD-10-CM

## 2017-12-01 DIAGNOSIS — H9313 Tinnitus, bilateral: Secondary | ICD-10-CM

## 2017-12-09 ENCOUNTER — Ambulatory Visit
Admission: RE | Admit: 2017-12-09 | Discharge: 2017-12-09 | Disposition: A | Discharge status: Home / Self Care | Payer: Medicare Other | Source: Ambulatory Visit | Attending: Otolaryngology | Admitting: Otolaryngology

## 2017-12-09 DIAGNOSIS — H903 Sensorineural hearing loss, bilateral: Secondary | ICD-10-CM

## 2017-12-09 DIAGNOSIS — H9313 Tinnitus, bilateral: Secondary | ICD-10-CM

## 2017-12-11 ENCOUNTER — Other Ambulatory Visit: Payer: Self-pay | Admitting: Otolaryngology

## 2018-01-20 ENCOUNTER — Other Ambulatory Visit: Payer: Self-pay | Admitting: Otolaryngology

## 2018-01-20 DIAGNOSIS — H9313 Tinnitus, bilateral: Secondary | ICD-10-CM

## 2018-01-20 DIAGNOSIS — H903 Sensorineural hearing loss, bilateral: Secondary | ICD-10-CM

## 2018-02-07 ENCOUNTER — Other Ambulatory Visit: Payer: Self-pay | Admitting: Otolaryngology

## 2018-02-07 ENCOUNTER — Ambulatory Visit
Admission: RE | Admit: 2018-02-07 | Discharge: 2018-02-07 | Disposition: A | Discharge status: Home / Self Care | Payer: Medicare Other | Source: Ambulatory Visit | Attending: Otolaryngology | Admitting: Otolaryngology

## 2018-02-07 DIAGNOSIS — H9313 Tinnitus, bilateral: Secondary | ICD-10-CM

## 2018-02-07 DIAGNOSIS — H903 Sensorineural hearing loss, bilateral: Secondary | ICD-10-CM

## 2018-02-07 MED ORDER — IOPAMIDOL (ISOVUE-300) INJECTION 61%
75.0000 mL | Freq: Once | INTRAVENOUS | Status: AC | PRN
  Administered 2018-02-07: 75 mL via INTRAVENOUS

## 2020-05-07 ENCOUNTER — Telehealth: Payer: Self-pay

## 2020-05-07 DIAGNOSIS — Z9889 Other specified postprocedural states: Secondary | ICD-10-CM

## 2020-05-07 DIAGNOSIS — I340 Nonrheumatic mitral (valve) insufficiency: Secondary | ICD-10-CM

## 2020-05-07 NOTE — Telephone Encounter (Signed)
Dr. Claris Gladden mother's cardiologist retired and will need to establish with Dr. Burt Knack.  Called to arrange echo and OV.  Left message to call back.

## 2020-05-08 NOTE — Telephone Encounter (Signed)
Scheduled patient for echo and NP OV with Dr. Burt Knack 7/21. She was grateful for assistance.

## 2020-06-12 ENCOUNTER — Other Ambulatory Visit: Payer: Self-pay

## 2020-06-12 ENCOUNTER — Encounter: Payer: Self-pay | Admitting: Cardiovascular Disease

## 2020-06-12 ENCOUNTER — Ambulatory Visit (INDEPENDENT_AMBULATORY_CARE_PROVIDER_SITE_OTHER): Payer: Medicare Other | Admitting: Cardiovascular Disease

## 2020-06-12 ENCOUNTER — Ambulatory Visit (HOSPITAL_COMMUNITY): Payer: Medicare Other | Attending: Cardiovascular Disease

## 2020-06-12 VITALS — BP 120/82 | HR 70 | Ht 63.0 in

## 2020-06-12 DIAGNOSIS — E782 Mixed hyperlipidemia: Secondary | ICD-10-CM | POA: Diagnosis not present

## 2020-06-12 DIAGNOSIS — I2510 Atherosclerotic heart disease of native coronary artery without angina pectoris: Secondary | ICD-10-CM

## 2020-06-12 DIAGNOSIS — Z9889 Other specified postprocedural states: Secondary | ICD-10-CM | POA: Diagnosis present

## 2020-06-12 DIAGNOSIS — I340 Nonrheumatic mitral (valve) insufficiency: Secondary | ICD-10-CM | POA: Insufficient documentation

## 2020-06-12 LAB — ECHOCARDIOGRAM COMPLETE
Area-P 1/2: 2.77 cm2
S' Lateral: 1.7 cm

## 2020-06-12 MED ORDER — PERFLUTREN LIPID MICROSPHERE
1.0000 mL | INTRAVENOUS | Status: AC | PRN
  Administered 2020-06-12: 1 mL via INTRAVENOUS
  Administered 2020-06-12: 1.5 mL via INTRAVENOUS

## 2020-06-12 MED ORDER — EZETIMIBE 10 MG PO TABS: 10.0000 mg | ORAL_TABLET | Freq: Every day | ORAL | 3 refills | Status: DC

## 2020-06-12 NOTE — Patient Instructions (Signed)
Medication Instructions:  1) START ZETIA 10 mg daily *If you need a refill on your cardiac medications before your next appointment, please call your pharmacy*  Lab Work: Please talk to your PCP about having follow-up blood work in 3 months (fasting cholesterol panel and LFTs). Have them fax the results to [place] at [phone].  Follow-Up: At [facility], you and your health needs are our priority.  As part of our continuing mission to provide you with exceptional heart care, we have created designated Provider Care Teams.  These Care Teams include your primary Cardiologist (physician) and Advanced Practice Providers (APPs -  Physician Assistants and Nurse Practitioners) who all work together to provide you with the care you need, when you need it. Your next appointment:   12 month(s) The format for your next appointment:   In Person Provider:   You may see Sherren Mocha, MD or one of the following Advanced Practice Providers on your designated Care Team:    Richardson Dopp, PA-C  Vin Haz[place], [place]

## 2020-06-12 NOTE — Progress Notes (Signed)
Cardiology Office Note:    Date:  06/12/2020   ID:  Laura Galvan, DOB 12-16-1945, MRN [id number]  PCP:  System, Pcp Not In  [facility] Cardiologist:  Sherren Mocha, MD  [place] Electrophysiologist:  None   Referring MD: No ref. provider found   Chief Complaint  Patient presents with  . Follow-up    s/p mitral valve repair    History of Present Illness:    Laura Galvan is a 74 y.o. female with a hx of mitral valve prolapse with severe symptomatic mitral regurgitation, treated with minimally invasive mitral valve repair in September 2017.  The patient lives in [place] and is a mother of Dr. Aundra Dubin.  She is here alone today.  The patient has been seen in the [place] system, but her cardiologist retired.  She is doing well with no cardiac-related complaints.  She specifically denies chest pain, chest pressure, shortness of breath, heart palpitations, orthopnea, or PND.  Has had no leg swelling.  She remains active and functionally independent.  Past Medical History:  Diagnosis Date  . Acute blood loss anemia   . Breast cancer ([place])   . Bronchiectasis ([place]) 09/13/2015   October 2016 pulmonary function testing ratio 80%, FEV1 1.93 L (88% predicted), FVC 2.43 L (43% predicted), total lung capacity 4.33 L (88% predicted), DLCO 20.52 (89% predicted). October 2016 CT chest with areas of mild bronchiectasis bilaterally, no other pulmonary parenchymal abnormality   . Cough 08/13/2015   07/23/2015 CXR > normal   . Heart murmur   . Hypercholesteremia   . Hyperglycemia   . Hypokalemia   . Hyponatremia   . Leukocytosis   . Mitral regurgitation   . Mitral valve prolapse   . MVP (mitral valve prolapse) 07/10/2016  . Other secondary hypertension   . Pneumonia    history of 2014  . Pneumothorax   . 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 Severe mitral regurgitation   . Thrombocytopenia ([place])   . 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 APPENDECTOMY  1960  . CARDIAC CATHETERIZATION N/A 07/16/2016   Procedure: Right/Left Heart Cath and Coronary Angiography;  Surgeon: Sherren Mocha, MD;  Location: [facility];  Service: Cardiovascular;  Laterality: N/A;  . MASTECTOMY Left 2010  . MITRAL VALVE REPAIR Right 07/29/2016   Procedure: MINIMALLY INVASIVE MITRAL VALVE REPAIR (MVR) USING 34 MEMO  ANNULOPLASTY RING.;  Surgeon: Rexene Alberts, MD;  Location: [facility];  Service: Open Heart Surgery;  Laterality: Right;  . PERIPHERAL VASCULAR CATHETERIZATION N/A 07/16/2016   Procedure: Abdominal Aortogram;  Surgeon: Sherren Mocha, MD;  Location: [place] [facility];  Service: Cardiovascular;  Laterality: N/A;  . ROTATOR CUFF REPAIR Right 2011  . TEE WITHOUT CARDIOVERSION N/A 07/29/2016   Procedure: TRANSESOPHAGEAL ECHOCARDIOGRAM (TEE);  Surgeon: Rexene Alberts, MD;  Location: [place];  Service: Open Heart Surgery;  Laterality: N/A;    Current Medications: Current Meds  Medication Sig  . acetaminophen (TYLENOL) 325 MG tablet Take 2 tablets (650 mg total) by mouth every 6 (six) hours as  needed for mild pain, fever or headache.  Marland Kitchen aspirin EC 81 MG EC tablet Take 1 tablet (81 mg total) by mouth daily.  Marland Kitchen atorvastatin (LIPITOR) 10 MG tablet Take 10 mg by mouth daily at 6 PM.   . Calcium Carbonate-Vitamin D (CALTRATE 600+D PO) Take 1 tablet by mouth daily.  . cetirizine (ZYRTEC) 10 MG chewable tablet Chew 10 mg by mouth daily as needed.   . Multiple Vitamin (MULTIVITAMIN WITH  MINERALS) TABS tablet Take 1 tablet by mouth daily.     Allergies:   Desmopressin, Metoprolol, Tramadol, Other, and Sulfa antibiotics   Social History   Socioeconomic History  . Marital status: Married    Spouse name: Not on file  . Number of children: Not on file  . Years of education: Not on file  . Highest education level: Not on file  Occupational History  . Not on file  Tobacco Use  . Smoking status: Never Smoker  . Smokeless tobacco: Never Used  Substance and Sexual Activity  . Alcohol use: Yes    Alcohol/week: 0.0 standard drinks    Comment: moderately  . Drug use: No  . Sexual activity: Not on file  Other Topics Concern  . Not on file  Social History Narrative  . Not on file   Social Determinants of Health   Financial Resource Strain:   . Difficulty of Paying Living Expenses:   Food Insecurity:   . Worried About Charity fundraiser in the Last Year:   . Arboriculturist in the Last Year:   Transportation Needs:   . Film/video editor (Medical):   Marland Kitchen Lack of Transportation (Non-Medical):   Physical Activity:   . Days of Exercise per Week:   . Minutes of Exercise per Session:   Stress:   . Feeling of Stress :   Social Connections:   . Frequency of Communication with Friends and Family:   . Frequency of Social Gatherings with Friends and Family:   . Attends Religious Services:   . Active Member of Clubs or Organizations:   . Attends Archivist Meetings:   Marland Kitchen Marital Status:      Family History: The patient's family history includes Clotting disorder in her unknown relative; Heart disease in her father; Lung cancer in her father and mother.  ROS:   Please see the history of present illness.    All other systems reviewed and are negative.  EKGs/Labs/Other Studies Reviewed:    The following studies were reviewed today: 2D echo images were personally reviewed.  This shows vigorous LV function with LVEF greater than 65%.  The patient's mitral  valve repair is intact with no residual MR and a mean transmitral gradient of 3 mmHg.  There is no other significant valve disease noted.  Formal interpretation is currently pending.  EKG:  EKG is ordered today.  The ekg ordered today demonstrates normal sinus rhythm 72 bpm.  Within normal limits.  Recent Labs: No results found for requested labs within last 8760 hours.  Recent Lipid Panel No results found for: CHOL, TRIG, HDL, CHOLHDL, VLDL, LDLCALC, LDLDIRECT  Physical Exam:    VS:  BP 120/82   Pulse 70   Ht 5\' 3"  (1.6 m)   BMI 19.27 kg/m     Wt Readings from Last 3 Encounters:  08/16/17 108 lb 12.8 oz (49.4 kg)  09/14/16 98 lb (44.5 kg)  08/13/16 103 lb (46.7 kg)     GEN:  Well  nourished, well developed in no acute distress HEENT: Normal NECK: No JVD; No carotid bruits LYMPHATICS: No lymphadenopathy CARDIAC: RRR, no murmurs, rubs, gallops RESPIRATORY:  Clear to auscultation without rales, wheezing or rhonchi  ABDOMEN: Soft, non-tender, non-distended MUSCULOSKELETAL:  No edema; No deformity  SKIN: Warm and dry NEUROLOGIC:  Alert and oriented x 3 PSYCHIATRIC:  Normal affect   ASSESSMENT:    1. S/P minimally invasive mitral valve repair   2. Mixed hyperlipidemia   3. Coronary artery disease involving native coronary artery of native heart without angina pectoris    PLAN:    In order of problems listed above:  1. The patient's echo is reviewed as above.  She appears to have normal function of her mitral valve now 4 years out following minimally invasive mitral valve repair.  The patient has New York Heart Association functional class I symptoms with no cardiac-related limitation.  She remains on aspirin 81 mg daily.  She follows SBE prophylaxis per guidelines. 2. Reviewed lipids.  While she has a good overall ratio, her LDL cholesterol is 105 mg/dL.  She does have some muscle cramping that might be associated with atorvastatin.  Will add Zetia 10 mg daily and repeat  lipids and LFTs in 3 months. 3. I reviewed her coronary angiogram from 2017.  There is nonobstructive plaquing throughout the proximal LAD and minimal plaquing in the mid RCA.  Lipids as outlined above with addition of Zetia today.  Otherwise continue current medicines without change.   Medication Adjustments/Labs and Tests Ordered: Current medicines are reviewed at length with the patient today.  Concerns regarding medicines are outlined above.  No orders of the defined types were placed in this encounter.  Meds ordered this encounter  Medications  . ezetimibe (ZETIA) 10 MG tablet    Sig: Take 1 tablet (10 mg total) by mouth daily.    Dispense:  90 tablet    Refill:  3    Patient Instructions  Medication Instructions:  1) START ZETIA 10 mg daily *If you need a refill on your cardiac medications before your next appointment, please call your pharmacy*  Lab Work: Please talk to your PCP about having follow-up blood work in 3 months (fasting cholesterol panel and LFTs). Have them fax the results to [place] at [phone].  Follow-Up: At [facility], you and your health needs are our priority.  As part of our continuing mission to provide you with exceptional heart care, we have created designated Provider Care Teams.  These Care Teams include your primary Cardiologist (physician) and Advanced Practice Providers (APPs -  Physician Assistants and Nurse Practitioners) who all work together to provide you with the care you need, when you need it. Your next appointment:   12 month(s) The format for your next appointment:   In Person Provider:   You may see Sherren Mocha, MD or one of the following Advanced Practice Providers on your designated Care Team:    Richardson Dopp, PA-C  Robbie Lis, [place]      Signed, Sherren Mocha, MD  06/12/2020 3:25 PM    [place]

## 2020-06-18 NOTE — Addendum Note (Signed)
Addended by: Jacinta Shoe on: 06/18/2020 10:42 AM   Modules accepted: Orders

## 2021-05-27 ENCOUNTER — Other Ambulatory Visit: Payer: Self-pay | Admitting: Cardiovascular Disease

## 2021-06-21 ENCOUNTER — Other Ambulatory Visit: Payer: Self-pay | Admitting: Cardiovascular Disease

## 2021-09-11 ENCOUNTER — Telehealth: Payer: Self-pay

## 2021-09-11 NOTE — Telephone Encounter (Signed)
Primary [place], MD  Chart reviewed as part of pre-operative protocol coverage. Because of Laura Galvan's past medical history and time since last visit, he/she will require a follow-up visit in order to better assess preoperative cardiovascular risk.  Pre-op covering staff: - Please schedule appointment and call patient to inform them. - Please contact requesting surgeon's office via preferred method (i.e, phone, fax) to inform them of need for appointment prior to surgery.  If applicable, this message will also be routed to pharmacy pool and/or primary cardiologist for input on holding anticoagulant/antiplatelet agent as requested below so that this information is available at time of patient's appointment.   Deberah Pelton, NP  09/11/2021, 2:50 PM

## 2021-09-11 NOTE — Telephone Encounter (Signed)
   [facility] Pre-operative Risk Assessment    Patient Name: Kollyns Mickelson  DOB: 12-15-1945 MRN: [id number]  HEARTCARE STAFF:  - IMPORTANT!!!!!! Under Visit Info/Reason for Call, type in Other and utilize the format Clearance MM/DD/YY or Clearance TBD. Do not use dashes or single digits. - Please review there is not already an duplicate clearance open for this procedure. - If request is for dental extraction, please clarify the # of teeth to be extracted. - If the patient is currently at the dentist's office, call Pre-Op Callback Staff (MA/nurse) to input urgent request.  - If the patient is not currently in the dentist office, please route to the Pre-Op pool.  Request for surgical clearance:  What type of surgery is being performed? Right Total Hip Arthroplasty - Anterior  When is this surgery scheduled? TBD  What type of clearance is required (medical clearance vs. Pharmacy clearance to hold med vs. Both)? Pharmacy  Are there any medications that need to be held prior to surgery and how long? Aspirin  Practice name and name of physician performing surgery? Gaynelle Arabian, MD  What is the office phone number? [phone] ATTN: KELLY HANCOCK   7.   What is the office fax number? [phone]  8.   Anesthesia type (None, local, MAC, general) ? [place]   [place] 09/11/2021, 2:25 PM  _________________________________________________________________   (provider comments below)

## 2021-09-22 ENCOUNTER — Other Ambulatory Visit: Payer: Self-pay

## 2021-09-22 ENCOUNTER — Encounter: Payer: Self-pay | Admitting: Cardiovascular Disease

## 2021-09-22 ENCOUNTER — Ambulatory Visit (INDEPENDENT_AMBULATORY_CARE_PROVIDER_SITE_OTHER): Payer: Medicare Other | Admitting: Cardiovascular Disease

## 2021-09-22 VITALS — BP 110/70 | HR 67 | Ht 62.0 in | Wt 99.2 lb

## 2021-09-22 DIAGNOSIS — Z01818 Encounter for other preprocedural examination: Secondary | ICD-10-CM

## 2021-09-22 DIAGNOSIS — Z01810 Encounter for preprocedural cardiovascular examination: Secondary | ICD-10-CM

## 2021-09-22 DIAGNOSIS — I2510 Atherosclerotic heart disease of native coronary artery without angina pectoris: Secondary | ICD-10-CM

## 2021-09-22 DIAGNOSIS — I340 Nonrheumatic mitral (valve) insufficiency: Secondary | ICD-10-CM

## 2021-09-22 DIAGNOSIS — E782 Mixed hyperlipidemia: Secondary | ICD-10-CM | POA: Diagnosis not present

## 2021-09-22 NOTE — Patient Instructions (Addendum)
Medication Instructions:  *If you need a refill on your cardiac medications before your next appointment, please call your pharmacy*  Lab Work: If you have labs (blood work) drawn today and your tests are completely normal, you will receive your results only by: [place] (if you have MyChart) OR A paper copy in the mail If you have any lab test that is abnormal or we need to change your treatment, we will call you to review the results.  Testing/Procedures: None ordered today.  Follow-Up: At [facility], you and your health needs are our priority.  As part of our continuing mission to provide you with exceptional heart care, we have created designated Provider Care Teams.  These Care Teams include your primary Cardiologist (physician) and Advanced Practice Providers (APPs -  Physician Assistants and Nurse Practitioners) who all work together to provide you with the care you need, when you need it.  We recommend signing up for the patient portal called "MyChart".  Sign up information is provided on this After Visit Summary.  MyChart is used to connect with patients for Virtual Visits (Telemedicine).  Patients are able to view lab/test results, encounter notes, upcoming appointments, etc.  Non-urgent messages can be sent to your provider as well.   To learn more about what you can do with MyChart, go to [email or web address].    Your next appointment:   12 month(s)  The format for your next appointment:   In Person  Provider:   You may see Sherren Mocha, MD or one of the following Advanced Practice Providers on your designated Care Team:   Richardson Dopp, PA-C [place], [place]

## 2021-09-22 NOTE — Progress Notes (Signed)
Cardiology Office Note:    Date:  09/22/2021   ID:  Laura Galvan, DOB 08/12/1946, MRN [id number]  PCP:  Dionne Ano, MD   [facility] Cardiologist:  Sherren Mocha, MD     Referring MD: No ref. provider found   Chief Complaint  Patient presents with   Pre-op Exam    History of Present Illness:    Laura Galvan is a 75 y.o. female with a hx of mitral valve prolapse with severe symptomatic mitral regurgitation, treated with minimally invasive mitral valve repair in 2017.  The patient lives in [place] and is a mother of Dr. Aundra Dubin.  She is here alone today.  She is scheduled for hip replacement surgery November 12, 2021.  She is significantly limited from her hip and has failed conservative therapy.  From a cardiac perspective she is doing fine.  She has had a normal functional capacity and has previously been able to exercise without any cardiopulmonary symptoms.  She specifically denies chest pain, chest pressure, shortness of breath, heart palpitations, lightheadedness, or leg swelling.  She reports no previous difficulty with anesthesia.  She has had no severe reactions at the time of her past surgical procedures.  She is to use DDAVP in the remote past but has not required this for recent surgeries.  Past Medical History:  Diagnosis Date   Acute blood loss anemia    Breast cancer ([place])    Bronchiectasis ([place]) 09/13/2015   October 2016 pulmonary function testing ratio 80%, FEV1 1.93 L (88% predicted), FVC 2.43 L (43% predicted), total lung capacity 4.33 L (88% predicted), DLCO 20.52 (89% predicted). 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   Complex valvuloplasty including quadrangular resection of posterior leaflet x2, sliding leaflet plasty, artificial Gore-tex neochord placement x12 and 34 mm Sorin Memo 3D ring annuloplasty via right mini thoracotomy approach   Severe mitral regurgitation    Thrombocytopenia ([place])    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 ([place])    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 [facility])     Past Surgical History:  Procedure Laterality Date   APPENDECTOMY  1960   CARDIAC CATHETERIZATION N/A 07/16/2016   Procedure: Right/Left Heart Cath and Coronary Angiography;  Surgeon: Sherren Mocha, MD;  Location: [facility];  Service: Cardiovascular;  Laterality: N/A;   MASTECTOMY Left 2010   MITRAL VALVE REPAIR Right 07/29/2016   Procedure: MINIMALLY INVASIVE MITRAL VALVE REPAIR (MVR) USING 34 MEMO  ANNULOPLASTY RING.;  Surgeon: Rexene Alberts, MD;  Location: [facility];  Service: Open Heart Surgery;  Laterality: Right;   PERIPHERAL VASCULAR CATHETERIZATION N/A 07/16/2016   Procedure: Abdominal Aortogram;  Surgeon: Sherren Mocha, MD;  Location: [facility];  Service: Cardiovascular;  Laterality: N/A;   ROTATOR CUFF REPAIR Right 2011   TEE WITHOUT CARDIOVERSION N/A 07/29/2016   Procedure: TRANSESOPHAGEAL ECHOCARDIOGRAM (  TEE);  Surgeon: Rexene Alberts, MD;  Location: [facility];  Service: Open Heart Surgery;  Laterality: N/A;    Current Medications: Current Meds  Medication Sig   acetaminophen (TYLENOL) 325 MG tablet Take 2 tablets (650 mg total) by mouth every 6 (six) hours as needed for mild pain, fever or headache.   aspirin EC 81 MG EC tablet Take 1 tablet (81 mg total) by mouth daily.   atorvastatin (LIPITOR) 10 MG tablet Take 10 mg  by mouth daily at 6 PM.    Calcium Carbonate-Vitamin D (CALTRATE 600+D PO) Take 1 tablet by mouth daily.   EPINEPHrine 0.3 mg/0.3 mL IJ SOAJ injection Inject into the muscle.   ezetimibe (ZETIA) 10 MG tablet Take 1 tablet (10 mg total) by mouth daily. Keep upcoming appointment for future refills   Multiple Vitamin (MULTIVITAMIN WITH MINERALS) TABS tablet Take 1 tablet by mouth daily.   Olopatadine HCl 0.2 % SOLN ONE DROP IN EACH EYE DAILY OPHTHALMIC 30 DAYS AS NEEDED   Pediatric Multivitamins-Iron (FLINTSTONES COMPLETE) 10 MG CHEW Chew by mouth.     Allergies:   Desmopressin, Metoprolol, Sulfur, Tramadol, Other, and Sulfa antibiotics   Social History   Socioeconomic History   Marital status: Married    Spouse name: Not on file   Number of children: Not on file   Years of education: Not on file   Highest education level: Not on file  Occupational History   Not on file  Tobacco Use   Smoking status: Never   Smokeless tobacco: Never  Substance and Sexual Activity   Alcohol use: Yes    Alcohol/week: 0.0 standard drinks    Comment: moderately   Drug use: No   Sexual activity: Not on file  Other Topics Concern   Not on file  Social History Narrative   Not on file   Social Determinants of Health   Financial Resource Strain: Not on file  Food Insecurity: Not on file  Transportation Needs: Not on file  Physical Activity: Not on file  Stress: Not on file  Social Connections: Not on file     Family History: The patient's family history includes Clotting disorder in her unknown relative; Heart disease in her father; Lung cancer in her father and mother.  ROS:   Please see the history of present illness.    All other systems reviewed and are negative.  EKGs/Labs/Other Studies Reviewed:    The following studies were reviewed today: Echo 06/12/2020: FINDINGS   Left Ventricle: Left ventricular ejection fraction, by estimation, is 60  to 65%. The left ventricle has normal  function. The left ventricle has no  regional wall motion abnormalities. Definity contrast agent was given IV  to delineate the left ventricular   endocardial borders. The left ventricular internal cavity size was normal  in size. There is mild left ventricular hypertrophy. Left ventricular  diastolic function could not be evaluated due to mitral valve repair. Left  ventricular diastolic function  could not be evaluated.   Right Ventricle: The right ventricular size is normal. No increase in  right ventricular wall thickness. Right ventricular systolic function is  normal. Tricuspid regurgitation signal is inadequate for assessing PA  pressure.   Left Atrium: Left atrial size was mildly dilated.   Right Atrium: Right atrial size was normal in size.   Pericardium: Trivial pericardial effusion is present.   Mitral Valve: The mitral valve has been repaired/replaced. No evidence of  mitral valve regurgitation. There is a  34 mm prosthetic annuloplasty ring  present in the mitral position. Procedure Date: 07/29/2016. MV peak  gradient, 6.6 mmHg. The mean mitral  valve gradient is 3.0 mmHg with average heart rate of 71 bpm.   Tricuspid Valve: The tricuspid valve is grossly normal. Tricuspid valve  regurgitation is not demonstrated. No evidence of tricuspid stenosis.   Aortic Valve: The aortic valve is tricuspid. . There is mild thickening of  the aortic valve. Aortic valve regurgitation is not visualized. No aortic  stenosis is present. There is mild thickening of the aortic valve.   Pulmonic Valve: The pulmonic valve was grossly normal. Pulmonic valve  regurgitation is not visualized. No evidence of pulmonic stenosis.   Aorta: The aortic root and ascending aorta are structurally normal, with  no evidence of dilitation.   Venous: The inferior vena cava was not well visualized.   IAS/Shunts: The atrial septum is grossly normal.   EKG:  EKG is ordered today.  The ekg ordered today  demonstrates NSR 67 bpm, within normal limits  Recent Labs: No results found for requested labs within last 8760 hours.  Recent Lipid Panel No results found for: CHOL, TRIG, HDL, CHOLHDL, VLDL, LDLCALC, LDLDIRECT   Risk Assessment/Calculations:           Physical Exam:    VS:  BP 110/70   Pulse 67   Ht 5\' 2"  (1.575 m)   Wt 99 lb 3.2 oz (45 kg)   SpO2 99%   BMI 18.14 kg/m     Wt Readings from Last 3 Encounters:  09/22/21 99 lb 3.2 oz (45 kg)  08/16/17 108 lb 12.8 oz (49.4 kg)  09/14/16 98 lb (44.5 kg)     GEN:  Well nourished, well developed in no acute distress HEENT: Normal NECK: No JVD; No carotid bruits LYMPHATICS: No lymphadenopathy CARDIAC: RRR, no murmurs, rubs, gallops RESPIRATORY:  Clear to auscultation without rales, wheezing or rhonchi  ABDOMEN: Soft, non-tender, non-distended MUSCULOSKELETAL:  No edema; No deformity  SKIN: Warm and dry NEUROLOGIC:  Alert and oriented x 3 PSYCHIATRIC:  Normal affect   ASSESSMENT:    1. Mixed hyperlipidemia   2. Coronary artery disease involving native coronary artery of native heart without angina pectoris   3. Pre-op evaluation   4. Mitral valve insufficiency, unspecified etiology    PLAN:    In order of problems listed above:  The patient is treated with Zetia and atorvastatin.  Lipids from August 15, 2021 reviewed with cholesterol 131, HDL 59, LDL 78.  Transaminases are within normal limits with an AST of 23 and an ALT of 26. Nonobstructive CAD at time of cardiac catheterization.  No anginal symptoms. The patient is at low risk of hip replacement surgery.  She has had echo assessment after undergoing mitral valve repair, demonstrating normal function of her mitral valve.  She has good functional capacity with no cardiopulmonary symptoms. Last year's echo reviewed with normal function of her mitral valve following minimally invasive mitral valve repair.  Transvalvular gradients are normal and there is no residual  mitral regurgitation.        Medication Adjustments/Labs and Tests Ordered: Current medicines are reviewed at length with the patient today.  Concerns regarding medicines are outlined above.  Orders Placed This Encounter  Procedures   EKG 12-Lead    No orders of the defined types were placed in this encounter.   Patient Instructions  Medication Instructions:  *If you need a refill on your cardiac medications before your  next appointment, please call your pharmacy*  Lab Work: If you have labs (blood work) drawn today and your tests are completely normal, you will receive your results only by: [place] (if you have MyChart) OR A paper copy in the mail If you have any lab test that is abnormal or we need to change your treatment, we will call you to review the results.  Testing/Procedures: None ordered today.  Follow-Up: At [facility], you and your health needs are our priority.  As part of our continuing mission to provide you with exceptional heart care, we have created designated Provider Care Teams.  These Care Teams include your primary Cardiologist (physician) and Advanced Practice Providers (APPs -  Physician Assistants and Nurse Practitioners) who all work together to provide you with the care you need, when you need it.  We recommend signing up for the patient portal called "MyChart".  Sign up information is provided on this After Visit Summary.  MyChart is used to connect with patients for Virtual Visits (Telemedicine).  Patients are able to view lab/test results, encounter notes, upcoming appointments, etc.  Non-urgent messages can be sent to your provider as well.   To learn more about what you can do with MyChart, go to [email or web address].    Your next appointment:   12 month(s)  The format for your next appointment:   In Person  Provider:   You may see Sherren Mocha, MD or one of the following Advanced Practice Providers on your designated Care  Team:   Richardson Dopp, PA-C Robbie Lis, [place]   Signed, Sherren Mocha, MD  09/22/2021 12:26 PM    [place]

## 2021-11-03 NOTE — Patient Instructions (Addendum)
DUE TO COVID-19 ONLY ONE VISITOR IS ALLOWED TO COME WITH YOU AND STAY IN THE WAITING ROOM ONLY DURING PRE OP AND PROCEDURE DAY OF SURGERY IF YOU ARE GOING HOME AFTER SURGERY. IF YOU ARE SPENDING THE NIGHT 2 PEOPLE MAY VISIT WITH YOU IN YOUR PRIVATE ROOM AFTER SURGERY UNTIL VISITING  HOURS ARE OVER AT 800 PM AND 1  VISITOR  MAY  SPEND THE NIGHT.                Laura Galvan     Your procedure is scheduled on: 11/12/21   Report to [facility] Main  Entrance   Report to Short stay at 5:15 AM     Call this number if you have problems the morning of surgery [phone]   No food after midnight.    You may have clear liquid until 4:30 AM.    At 4:00 AM drink pre surgery drink.   Nothing by mouth after 4:30 AM.    CLEAR LIQUID DIET   Foods Allowed                                                                     Foods Excluded  Coffee and tea, regular and decaf                             liquids that you cannot  Plain Jell-O any favor except red or purple                                           see through such as: Fruit ices (not with fruit pulp)                                     milk, soups, orange juice  Iced Popsicles                                    All solid food Carbonated beverages, regular and diet                                    Cranberry, grape and apple juices Sports drinks like Gatorade Lightly seasoned clear broth or consume(fat free) Sugar     BRUSH YOUR TEETH MORNING OF SURGERY AND RINSE YOUR MOUTH OUT, NO CHEWING GUM CANDY OR MINTS.     Take these medicines the morning of surgery with A SIP OF WATER: none  Stop taking _ASA__________on _12/13_________as instructed by _____________.                                   You may not have any metal on your body including hair pins and              piercings  Do not wear jewelry, make-up, lotions,  powders or perfumes, deodorant             Do not wear nail polish on your fingernails.  Do not  shave  48 hours prior to surgery.               Do not bring valuables to the hospital. Day.  Contacts, dentures or bridgework may not be worn into surgery.  Leave suitcase in the car. After surgery it may be brought to your room.                 [facility] - Preparing for Surgery Before surgery, you can play an important role.  Because skin is not sterile, your skin needs to be as free of germs as possible.  You can reduce the number of germs on your skin by washing with CHG (chlorahexidine gluconate) soap before surgery.  CHG is an antiseptic cleaner which kills germs and bonds with the skin to continue killing germs even after washing. Please DO NOT use if you have an allergy to CHG or antibacterial soaps.  If your skin becomes reddened/irritated stop using the CHG and inform your nurse when you arrive at Short Stay. Do not shave (including legs and underarms) for at least 48 hours prior to the first CHG shower.   Please follow these instructions carefully:  1.  Shower with CHG Soap the night before surgery and the  morning of Surgery.  2.  If you choose to wash your hair, wash your hair first as usual with your  normal  shampoo.  3.  After you shampoo, rinse your hair and body thoroughly to remove the  shampoo.                            4.  Use CHG as you would any other liquid soap.  You can apply chg directly  to the skin and wash                       Gently with a scrungie or clean washcloth.  5.  Apply the CHG Soap to your body ONLY FROM THE NECK DOWN.   Do not use on face/ open                           Wound or open sores. Avoid contact with eyes, ears mouth and genitals (private parts).                       Wash face,  Genitals (private parts) with your normal soap.             6.  Wash thoroughly, paying special attention to the area where your surgery  will be performed.  7.  Thoroughly rinse your body with warm water  from the neck down.  8.  DO NOT shower/wash with your normal soap after using and rinsing off  the CHG Soap.                9.  Pat yourself dry with a clean towel.            10.  Wear clean pajamas.            11.  Place clean sheets on  your bed the night of your first shower and do not  sleep with pets. Day of Surgery : Do not apply any lotions/deodorants the morning of surgery.  Please wear clean clothes to the hospital/surgery center.  FAILURE TO FOLLOW THESE INSTRUCTIONS MAY RESULT IN THE CANCELLATION OF YOUR SURGERY PATIENT SIGNATURE_________________________________  NURSE SIGNATURE__________________________________  ________________________________________________________________________   Laura Galvan  An incentive spirometer is a tool that can help keep your lungs clear and active. This tool measures how well you are filling your lungs with each breath. Taking long deep breaths may help reverse or decrease the chance of developing breathing (pulmonary) problems (especially infection) following: A long period of time when you are unable to move or be active. BEFORE THE PROCEDURE  If the spirometer includes an indicator to show your best effort, your nurse or respiratory therapist will set it to a desired goal. If possible, sit up straight or lean slightly forward. Try not to slouch. Hold the incentive spirometer in an upright position. INSTRUCTIONS FOR USE  Sit on the edge of your bed if possible, or sit up as far as you can in bed or on a chair. Hold the incentive spirometer in an upright position. Breathe out normally. Place the mouthpiece in your mouth and seal your lips tightly around it. Breathe in slowly and as deeply as possible, raising the piston or the ball toward the top of the column. Hold your breath for 3-5 seconds or for as long as possible. Allow the piston or ball to fall to the bottom of the column. Remove the mouthpiece from your mouth and breathe out  normally. Rest for a few seconds and repeat Steps 1 through 7 at least 10 times every 1-2 hours when you are awake. Take your time and take a few normal breaths between deep breaths. The spirometer may include an indicator to show your best effort. Use the indicator as a goal to work toward during each repetition. After each set of 10 deep breaths, practice coughing to be sure your lungs are clear. If you have an incision (the cut made at the time of surgery), support your incision when coughing by placing a pillow or rolled up towels firmly against it. Once you are able to get out of bed, walk around indoors and cough well. You may stop using the incentive spirometer when instructed by your caregiver.  RISKS AND COMPLICATIONS Take your time so you do not get dizzy or light-headed. If you are in pain, you may need to take or ask for pain medication before doing incentive spirometry. It is harder to take a deep breath if you are having pain. AFTER USE Rest and breathe slowly and easily. It can be helpful to keep track of a log of your progress. Your caregiver can provide you with a simple table to help with this. If you are using the spirometer at home, follow these instructions: [place] IF:  You are having difficultly using the spirometer. You have trouble using the spirometer as often as instructed. Your pain medication is not giving enough relief while using the spirometer. You develop fever of 100.5 F (38.1 C) or higher. SEEK IMMEDIATE MEDICAL CARE IF:  You cough up bloody sputum that had not been present before. You develop fever of 102 F (38.9 C) or greater. You develop worsening pain at or near the incision site. MAKE SURE YOU:  Understand these instructions. Will watch your condition. Will get help right away if you are  not doing well or get worse. Document Released: 03/22/2007 Document Revised: 02/01/2012 Document Reviewed: 05/23/2007 [facility] Patient Information  2014 [place], [place].   ________________________________________________________________________

## 2021-11-04 ENCOUNTER — Encounter (HOSPITAL_COMMUNITY)
Admission: RE | Admit: 2021-11-04 | Discharge: 2021-11-04 | Disposition: A | Discharge status: Home / Self Care | Payer: Medicare Other | Source: Ambulatory Visit | Attending: Orthopedic Surgery | Admitting: Orthopedic Surgery

## 2021-11-04 ENCOUNTER — Encounter (HOSPITAL_COMMUNITY): Payer: Self-pay

## 2021-11-04 ENCOUNTER — Other Ambulatory Visit: Payer: Self-pay

## 2021-11-04 VITALS — BP 127/67 | HR 74 | Temp 98.4°F | Resp 18 | Ht 62.5 in | Wt 100.0 lb

## 2021-11-04 DIAGNOSIS — Z01812 Encounter for preprocedural laboratory examination: Secondary | ICD-10-CM | POA: Insufficient documentation

## 2021-11-04 DIAGNOSIS — Z853 Personal history of malignant neoplasm of breast: Secondary | ICD-10-CM | POA: Diagnosis not present

## 2021-11-04 DIAGNOSIS — M1611 Unilateral primary osteoarthritis, right hip: Secondary | ICD-10-CM | POA: Diagnosis not present

## 2021-11-04 DIAGNOSIS — Z79899 Other long term (current) drug therapy: Secondary | ICD-10-CM | POA: Diagnosis not present

## 2021-11-04 DIAGNOSIS — Z01818 Encounter for other preprocedural examination: Secondary | ICD-10-CM

## 2021-11-04 DIAGNOSIS — I2510 Atherosclerotic heart disease of native coronary artery without angina pectoris: Secondary | ICD-10-CM | POA: Insufficient documentation

## 2021-11-04 LAB — CBC
HCT: 40.1 % (ref 36.0–46.0)
Hemoglobin: 13 g/dL (ref 12.0–15.0)
MCH: 30.7 pg (ref 26.0–34.0)
MCHC: 32.4 g/dL (ref 30.0–36.0)
MCV: 94.6 fL (ref 80.0–100.0)
Platelets: 227 10*3/uL (ref 150–400)
RBC: 4.24 MIL/uL (ref 3.87–5.11)
RDW: 13.4 % (ref 11.5–15.5)
WBC: 7 10*3/uL (ref 4.0–10.5)
nRBC: 0 % (ref 0.0–0.2)

## 2021-11-04 LAB — COMPREHENSIVE METABOLIC PANEL
ALT: 27 U/L (ref 0–44)
AST: 27 U/L (ref 15–41)
Albumin: 3.9 g/dL (ref 3.5–5.0)
Alkaline Phosphatase: 80 U/L (ref 38–126)
Anion gap: 5 (ref 5–15)
BUN: 21 mg/dL (ref 8–23)
CO2: 31 mmol/L (ref 22–32)
Calcium: 8.8 mg/dL — ABNORMAL LOW (ref 8.9–10.3)
Chloride: 103 mmol/L (ref 98–111)
Creatinine, Ser: 0.52 mg/dL (ref 0.44–1.00)
GFR, Estimated: >60 mL/min (ref >60)
Glucose, Bld: 78 mg/dL (ref 70–99)
Potassium: 4.4 mmol/L (ref 3.5–5.1)
Sodium: 139 mmol/L (ref 135–145)
Total Bilirubin: 0.6 mg/dL (ref 0.3–1.2)
Total Protein: 6.8 g/dL (ref 6.5–8.1)

## 2021-11-04 LAB — PROTIME-INR
INR: 1 (ref 0.8–1.2)
Prothrombin Time: 13.5 seconds (ref 11.4–15.2)

## 2021-11-04 LAB — SURGICAL PCR SCREEN
MRSA, PCR: NEGATIVE
Staphylococcus aureus: NEGATIVE

## 2021-11-04 NOTE — Progress Notes (Addendum)
COVID test- DOS pt from out of town   PCP - Dr. Marianna Payment Cardiologist - Dr. Ezzie Dural  Chest x-ray - no EKG - 09/22/21-epic Stress Test - no ECHO - 06/12/20-epic Cardiac Cath - 07/16/16 Mitral valve repair 2017 Pacemaker/ICD device last checked:NA  Sleep Study - no CPAP -   Fasting Blood Sugar - NA Checks Blood Sugar _____ times a day  Blood Thinner Instructions:ASA 81/ Dr. Burt Knack Aspirin Instructions:stop 7 days prior to DOS Last Dose:11/04/21  Anesthesia review: yes  Patient denies shortness of breath, fever, cough and chest pain at PAT appointment Pt reports no SOB with any activities. Pt has factor V leiden  Patient verbalized understanding of instructions that were given to them at the PAT appointment. Patient was also instructed that they will need to review over the PAT instructions again at home before surgery. yes

## 2021-11-05 NOTE — Progress Notes (Signed)
Anesthesia Chart Review   Case: [id number] Date/Time: 11/12/21 0815   Procedure: TOTAL HIP ARTHROPLASTY ANTERIOR APPROACH (Right: Hip)   Anesthesia type: Choice   Pre-op diagnosis: right hip osteoarthritis   Location: [place] [facility] / [facility]   Surgeons: Gaynelle Arabian, MD       DISCUSSION:75 y.o. never smoker with h/o right breast cancer, minimally invasive mitral valve repair 2017, right hip OA scheduled for above procedure 11/12/2021 with Dr. Gaynelle Arabian.   Pt seen by cardiology 09/22/2021 for preoperative evaluation. Per OV note,  "The patient is treated with Zetia and atorvastatin.  Lipids from August 15, 2021 reviewed with cholesterol 131, HDL 59, LDL 78.  Transaminases are within normal limits with an AST of 23 and an ALT of 26. Nonobstructive CAD at time of cardiac catheterization.  No anginal symptoms. The patient is at low risk of hip replacement surgery.  She has had echo assessment after undergoing mitral valve repair, demonstrating normal function of her mitral valve.  She has good functional capacity with no cardiopulmonary symptoms. Last year's echo reviewed with normal function of her mitral valve following minimally invasive mitral valve repair.  Transvalvular gradients are normal and there is no residual mitral regurgitation."  Anticipate pt can proceed with planned procedure barring acute status change.   VS: BP 127/67    Pulse 74    Temp 36.9 C (Oral)    Resp 18    Ht 5' 2.5" (1.588 m)    Wt 45.4 kg    SpO2 99%    BMI 18.00 kg/m   PROVIDERS: Dionne Ano, MD is PCP   Sherren Mocha, MD is Cardiologist  LABS: Labs reviewed: Acceptable for surgery. (all labs ordered are listed, but only abnormal results are displayed)  Labs Reviewed  COMPREHENSIVE METABOLIC PANEL - Abnormal; Notable for the following components:      Result Value   Calcium 8.8 (*)    All other components within normal limits  SURGICAL PCR SCREEN  CBC  PROTIME-INR  TYPE AND SCREEN      IMAGES:   EKG: 09/22/2021 Rate 67 bpm    CV: Echo 06/12/2020  1. Left ventricular ejection fraction, by estimation, is 60 to 65%. The  left ventricle has normal function. The left ventricle has no regional  wall motion abnormalities. There is mild left ventricular hypertrophy.  Left ventricular diastolic function  could not be evaluated.   2. Right ventricular systolic function is normal. The right ventricular  size is normal. Tricuspid regurgitation signal is inadequate for assessing  PA pressure.   3. Left atrial size was mildly dilated.   4. The mitral valve has been repaired/replaced. No evidence of mitral  valve regurgitation. The mean mitral valve gradient is 3.0 mmHg with  average heart rate of 71 bpm. There is a 34 mm prosthetic annuloplasty  ring present in the mitral position.  Procedure Date: 07/29/2016.   5. The aortic valve is tricuspid. Aortic valve regurgitation is not  visualized. No aortic stenosis is present.  Cardiac Cath 07/16/2016 Prox LAD to Mid LAD lesion, 30 %stenosed. LV end diastolic pressure is low. LV end diastolic pressure is normal.   1. Mild diffuse nonobstructive proximal LAD stenosis 2. Widely patent left main, left circumflex, and RCA 3. Normal intracardiac hemodynamics 4. Patent aortoiliac vessels and patent renal arteries bilaterally   Continue evaluation for minimally invasive mitral valve surgery. Medical therapy for nonobstructive CAD. Past Medical History:  Diagnosis Date   Breast cancer ([place])  2012   Right   Bronchiectasis ([place]) 09/13/2015   October 2016 pulmonary function testing ratio 80%, FEV1 1.93 L (88% predicted), FVC 2.43 L (43% predicted), total lung capacity 4.33 L (88% predicted), DLCO 20.52 (89% predicted). October 2016 CT chest with areas of mild bronchiectasis bilaterally, no other pulmonary parenchymal abnormality    Cough 08/13/2015   07/23/2015 CXR > normal    Heart murmur    Hypercholesteremia    Leukocytosis     MVP (mitral valve prolapse) 07/10/2016   Other secondary hypertension    Pneumonia    history of 2014   Pneumothorax 2011   S/P minimally invasive mitral valve repair 07/29/2016   Complex valvuloplasty including quadrangular resection of posterior leaflet x2, sliding leaflet plasty, artificial Gore-tex neochord placement x12 and 34 mm Sorin Memo 3D ring annuloplasty via right mini thoracotomy approach   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 [facility])     Past Surgical History:  Procedure Laterality Date   APPENDECTOMY  11/23/1958   CARDIAC CATHETERIZATION N/A 07/16/2016   Procedure: Right/Left Heart Cath and Coronary Angiography;  Surgeon: Sherren Mocha, MD;  Location: [place] [facility];  Service: Cardiovascular;  Laterality: N/A;   MASTECTOMY Right 11/23/2008   MITRAL VALVE REPAIR Right 07/29/2016   Procedure: MINIMALLY INVASIVE MITRAL VALVE REPAIR (MVR) USING 34 MEMO  ANNULOPLASTY RING.;  Surgeon: Rexene Alberts, MD;  Location: [place];  Service: Open Heart Surgery;  Laterality: Right;   PERIPHERAL VASCULAR CATHETERIZATION N/A 07/16/2016   Procedure: Abdominal Aortogram;  Surgeon: Sherren Mocha, MD;  Location: [place] [facility];  Service: Cardiovascular;  Laterality: N/A;   ROTATOR CUFF REPAIR Right 11/23/2009   low K after surgery pt had a seizure   TEE WITHOUT CARDIOVERSION N/A 07/29/2016   Procedure: TRANSESOPHAGEAL ECHOCARDIOGRAM (TEE);  Surgeon: Rexene Alberts, MD;  Location: [place];  Service: Open Heart Surgery;  Laterality: N/A;    MEDICATIONS:  acetaminophen (TYLENOL) 325 MG tablet   aspirin EC 81 MG EC tablet   atorvastatin (LIPITOR) 10 MG tablet   Calcium Carbonate-Vitamin D (CALTRATE 600+D PO)   EPINEPHrine 0.3 mg/0.3 mL IJ SOAJ injection   ezetimibe (ZETIA) 10 MG tablet   Multiple Vitamin (MULTIVITAMIN WITH MINERALS) TABS tablet   Polyethyl Glyc-Propyl Glyc PF (SYSTANE ULTRA PF) 0.4-0.3 % SOLN    No current facility-administered medications for this encounter.    Konrad Felix Ward, PA-C [facility] Pre-Surgical Testing [phone]

## 2021-11-05 NOTE — Anesthesia Preprocedure Evaluation (Addendum)
Anesthesia Evaluation  Patient identified by MRN, date of birth, ID band Patient awake    Reviewed: Allergy & Precautions, H&P , NPO status , Patient's Chart, lab work & pertinent test results  Airway Mallampati: I  TM Distance: >3 FB Neck ROM: Full    Dental no notable dental hx. (+) Teeth Intact, Dental Advisory Given, Implants   Pulmonary neg pulmonary ROS,  October 2016 pulmonary function testing ratio 80%, FEV1 1.93 L (88% predicted), FVC 2.43 L (43% predicted), total lung capacity 4.33 L (88% predicted), DLCO 20.52 (89% predicted). October 2016 CT chest with areas of mild bronchiectasis bilaterally, no other pulmonary parenchymal abnormality    Pulmonary exam normal breath sounds clear to auscultation       Cardiovascular Exercise Tolerance: Good hypertension, Pt. 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    Anesthesia Other Findings   Reproductive/Obstetrics negative OB ROS                           Anesthesia Physical Anesthesia Plan  ASA: 3  Anesthesia Plan: Spinal and MAC   Post-op Pain Management:    Induction: Intravenous  PONV Risk Score and Plan: 2 and Propofol infusion  Airway Management Planned: Natural Airway and Nasal Cannula  Additional Equipment: None  Intra-op Plan:   Post-operative Plan:   Informed Consent: I have reviewed the patients History and Physical, chart, labs  and discussed the procedure including the risks, benefits and alternatives for the proposed anesthesia with the patient or authorized representative who has indicated his/her understanding and acceptance.       Plan Discussed with: Anesthesiologist and CRNA  Anesthesia Plan Comments: (See PAT note 11/04/2021, Konrad Felix Ward, PA-C 75 y.o. never smoker with h/o right breast cancer, minimally invasive mitral valve repair 2017, right hip OA scheduled for above procedure 11/12/2021 with Dr. Gaynelle Arabian.   Pt seen by cardiology 09/22/2021 for preoperative evaluation. Per OV note,  1. "The patient is treated with Zetia and atorvastatin. Lipids from August 15, 2021 reviewed with cholesterol 131, HDL 59, LDL 78. Transaminases are within normal limits with an AST of 23 and an ALT of 26. 2. Nonobstructive CAD at time of cardiac catheterization. No anginal symptoms. 3. The patient is at low risk of hip replacement surgery. She has had echo assessment after undergoing mitral valve repair, demonstrating normal function of her mitral valve. She has good functional capacity with no cardiopulmonary symptoms. 4. Last year's echo reviewed with normal function of her mitral valve following minimally invasive mitral valve repair. Transvalvular gradients are normal and there is no residual mitral regurgitation." )      Anesthesia Quick Evaluation

## 2021-11-11 NOTE — H&P (Signed)
TOTAL HIP ADMISSION H&P  Patient is admitted for right total hip arthroplasty.  Subjective:  Chief Complaint: Right hip pain  HPI: Laura Galvan, 75 y.o. female, has a history of pain and functional disability in the right hip due to arthritis and patient has failed non-surgical conservative treatments for greater than 12 weeks to include NSAID's and/or analgesics, flexibility and strengthening excercises, and activity modification. Onset of symptoms was gradual, starting  several  years ago with gradually worsening course since that time. The patient noted no past surgery on the right hip. Patient currently rates pain in the right hip at 7 out of 10 with activity. Patient has worsening of pain with activity and weight bearing, pain that interfers with activities of daily living, and pain with passive range of motion. Patient has evidence of periarticular osteophytes and joint space narrowing by imaging studies. This condition presents safety issues increasing the risk of falls. There is no current active infection.  Patient Active Problem List   Diagnosis Date Noted   Pneumothorax    Surgery, elective    Other secondary hypertension    Hyperglycemia    Post-operative pain    Hyponatremia    Hypokalemia    Leukocytosis    Acute blood loss anemia    Thrombocytopenia (H[facility])    S/P minimally invasive mitral valve repair 07/29/2016   MVP (mitral valve prolapse) 07/10/2016   Mitral regurgitation    Bronchiectasis ([place]) 09/13/2015   Cough 08/13/2015    Past Medical History:  Diagnosis Date   Breast cancer ([place]) 2012   Right   Bronchiectasis ([place]) 09/13/2015   October 2016 pulmonary function testing ratio 80%, FEV1 1.93 L (88% predicted), FVC 2.43 L (43% predicted), total lung capacity 4.33 L (88% predicted), DLCO 20.52 (89% predicted). October 2016 CT chest with areas of mild bronchiectasis bilaterally, no other pulmonary parenchymal abnormality    Cough 08/13/2015   07/23/2015 CXR > normal     Heart murmur    Hypercholesteremia    Leukocytosis    MVP (mitral valve prolapse) 07/10/2016   Other secondary hypertension    Pneumonia    history of 2014   Pneumothorax 2011   S/P minimally invasive mitral valve repair 07/29/2016   Complex valvuloplasty including quadrangular resection of posterior leaflet x2, sliding leaflet plasty, artificial Gore-tex neochord placement x12 and 34 mm Sorin Memo 3D ring annuloplasty via right mini thoracotomy approach   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 [facility])     Past Surgical History:  Procedure Laterality Date   APPENDECTOMY  11/23/1958   CARDIAC CATHETERIZATION N/A 07/16/2016   Procedure: Right/Left Heart Cath and Coronary Angiography;  Surgeon: Sherren Mocha, MD;  Location: [place] [facility];  Service: Cardiovascular;  Laterality: N/A;   MASTECTOMY Right 11/23/2008   MITRAL VALVE REPAIR Right 07/29/2016   Procedure: MINIMALLY INVASIVE MITRAL VALVE REPAIR (MVR) USING 34 MEMO  ANNULOPLASTY RING.;  Surgeon: Rexene Alberts, MD;  Location: [place];  Service: Open Heart Surgery;  Laterality: Right;   PERIPHERAL VASCULAR CATHETERIZATION N/A 07/16/2016   Procedure: Abdominal Aortogram;  Surgeon: Sherren Mocha, MD;  Location: [place] [facility];  Service: Cardiovascular;  Laterality: N/A;   ROTATOR CUFF REPAIR Right 11/23/2009   low K after surgery pt had a seizure   TEE WITHOUT CARDIOVERSION N/A 07/29/2016   Procedure: TRANSESOPHAGEAL ECHOCARDIOGRAM (TEE);  Surgeon: Rexene Alberts, MD;  Location: [place];  Service: Open Heart Surgery;  Laterality: N/A;  Prior to Admission medications   Medication Sig Start Date End Date Taking? Authorizing Provider  acetaminophen (TYLENOL) 325 MG tablet Take 2 tablets (650 mg total) by mouth every 6 (six) hours as needed for mild pain, fever or headache. 08/05/16  Yes Lars Pinks M, PA-C  aspirin EC 81 MG EC tablet Take 1 tablet  (81 mg total) by mouth daily. Patient taking differently: Take 81 mg by mouth at bedtime. 08/05/16  Yes Lars Pinks M, PA-C  atorvastatin (LIPITOR) 10 MG tablet Take 10 mg by mouth daily at 6 PM.    Yes [provider]  Calcium Carbonate-Vitamin D (CALTRATE 600+D PO) Take 1 tablet by mouth daily.   Yes [provider]  EPINEPHrine 0.3 mg/0.3 mL IJ SOAJ injection Inject 0.3 mg into the muscle as needed for anaphylaxis. 06/04/16  Yes [provider]  ezetimibe (ZETIA) 10 MG tablet Take 1 tablet (10 mg total) by mouth daily. Keep upcoming appointment for future refills 06/23/21  Yes Sherren Mocha, MD  Multiple Vitamin (MULTIVITAMIN WITH MINERALS) TABS tablet Take 1 tablet by mouth daily.   Yes [provider]  Polyethyl Glyc-Propyl Glyc PF (SYSTANE ULTRA PF) 0.4-0.3 % SOLN Place 1 drop into both eyes daily as needed (dry eyes).   Yes [provider]    Allergies  Allergen Reactions   Desmopressin Other (See Comments)    HYPONATREMIA    Metoprolol Other (See Comments)    fatique   Tramadol Other (See Comments)    Pt had hallucination while on it    Other Other (See Comments)    Runny nose, post nasal drip, coughing Other Reaction: in childhood  Roaches, dust, grass/pollen, other environmental triggers: causes runny nose, post nasal drip, coughing   Sulfa Antibiotics Other (See Comments)    UNSPECIFIED REACTION FROM CHILDHOOD    Social History   Socioeconomic History   Marital status: Married    Spouse name: Not on file   Number of children: Not on file   Years of education: Not on file   Highest education level: Not on file  Occupational History   Not on file  Tobacco Use   Smoking status: Never   Smokeless tobacco: Never  Vaping Use   Vaping Use: Never used  Substance and Sexual Activity   Alcohol use: Yes   Drug use: No   Sexual activity: Not on file  Other Topics Concern   Not on file  Social History Narrative   Not  on file   Social Determinants of Health   Financial Resource Strain: Not on file  Food Insecurity: Not on file  Transportation Needs: Not on file  Physical Activity: Not on file  Stress: Not on file  Social Connections: Not on file  Intimate Partner Violence: Not on file    Tobacco Use: Low Risk    Smoking Tobacco Use: Never   Smokeless Tobacco Use: Never   Passive Exposure: Not on file   Social History   Substance and Sexual Activity  Alcohol Use Yes    Family History  Problem Relation Age of Onset   Heart disease Father    Lung cancer Father    Clotting disorder Unknown        "whole family" per pt.    Lung cancer Mother     ROS: Constitutional: no fever, no chills, no night sweats, no significant weight loss Cardiovascular: no chest pain, no palpitations Respiratory: no cough, no shortness of breath, No COPD Gastrointestinal: no  vomiting, no nausea Musculoskeletal: no swelling in Joints, Joint Pain Neurologic: no numbness, no tingling, no difficulty with balance    Objective:  Physical Exam: Well nourished and well developed.  General: Alert and oriented x3, cooperative and pleasant, no acute distress.  Head: normocephalic, atraumatic, neck supple.  Eyes: EOMI.  Respiratory: breath sounds clear in all fields, no wheezing, rales, or rhonchi. Cardiovascular: Regular rate and rhythm, no murmurs, gallops or rubs.  Abdomen: non-tender to palpation and soft, normoactive bowel sounds. Musculoskeletal:  Right Hip Exam:   ROM: Flexion to 110, Internal Rotation is minimal, External Rotation 20, and abduction 20 without discomfort.   There is no tenderness over the greater trochanter bursa.       Left Hip Exam:   ROM: Normal without discomfort.   There is no tenderness over the greater trochanter bursa.  Calves soft and nontender. Motor function intact in LE. Strength 5/5 LE bilaterally. Neuro: Distal pulses 2+. Sensation to light touch intact in  LE.    Vital signs in last 24 hours:    Imaging Review Radiographs- AP pelvis, AP and lateral of the right hip dated 09/05/2021 demonstrate severe end-stage arthritis of the right hip, bone-on-bone, with large osteophyte formation and subchondral cystic formation. The left hip shows some mild degenerative changes.   Assessment/Plan:  End stage arthritis, right hip  The patient history, physical examination, clinical judgement of the provider and imaging studies are consistent with end stage degenerative joint disease of the right hip and total hip arthroplasty is deemed medically necessary. The treatment options including medical management, injection therapy, arthroscopy and arthroplasty were discussed at length. The risks and benefits of total hip arthroplasty were presented and reviewed. The risks due to aseptic loosening, infection, stiffness, dislocation/subluxation, thromboembolic complications and other imponderables were discussed. The patient acknowledged the explanation, agreed to proceed with the plan and consent was signed. Patient is being admitted for inpatient treatment for surgery, pain control, PT, OT, prophylactic antibiotics, VTE prophylaxis, progressive ambulation and ADLs and discharge planning.The patient is planning to be discharged  home .   Patient's anticipated LOS is less than 2 midnights, meeting these requirements: - Younger than 34 - Lives within 1 hour of care - Has a competent adult at home to recover with post-op recover - NO history of  - Chronic pain requiring opiods  - Diabetes  - Coronary Artery Disease  - Heart failure  - Heart attack  - Stroke  - DVT/VTE  - Cardiac arrhythmia  - Respiratory Failure/COPD  - Renal failure  - Anemia  - Advanced Liver disease    Therapy Plans: HEP Disposition: Home with Son  Planned DVT Prophylaxis: Xarelto 10mg  DME Needed: None PCP: Dionne Ano Cardiologist: Sherren Mocha, MD (clearance  received) TXA: IV Allergies: Sulfa (unknown reaction), Tramadol caused hallucinations Anesthesia Concerns: None BMI: 18.1 Last HgbA1c: n/a  [facility] [place]  Other: - Brother had hx of DVT - genetic predisposition to blood clots  - Patient was instructed on what medications to stop prior to surgery. - Follow-up visit in 2 weeks with Dr. Wynelle Link - Begin physical therapy following surgery - Pre-operative lab work as pre-surgical testing - Prescriptions will be provided in hospital at time of discharge  Fenton Foy, [facility], PA-C Orthopedic Surgery [facility] [place]

## 2021-11-12 ENCOUNTER — Ambulatory Visit (HOSPITAL_COMMUNITY): Payer: Medicare Other | Admitting: Certified Registered Nurse Anesthetist

## 2021-11-12 ENCOUNTER — Ambulatory Visit (HOSPITAL_COMMUNITY): Payer: Medicare Other

## 2021-11-12 ENCOUNTER — Encounter (HOSPITAL_COMMUNITY)
Admission: RE | Disposition: A | Discharge status: Home / Self Care | Payer: Self-pay | Source: Ambulatory Visit | Attending: Orthopedic Surgery

## 2021-11-12 ENCOUNTER — Observation Stay (HOSPITAL_COMMUNITY)
Admission: RE | Admit: 2021-11-12 | Discharge: 2021-11-13 | Disposition: A | Discharge status: Home / Self Care | Payer: Medicare Other | Source: Ambulatory Visit | Attending: Orthopedic Surgery | Admitting: Orthopedic Surgery

## 2021-11-12 ENCOUNTER — Ambulatory Visit (HOSPITAL_COMMUNITY): Payer: Medicare Other | Admitting: Physician Assistant

## 2021-11-12 ENCOUNTER — Other Ambulatory Visit: Payer: Self-pay

## 2021-11-12 ENCOUNTER — Observation Stay (HOSPITAL_COMMUNITY): Payer: Medicare Other

## 2021-11-12 ENCOUNTER — Encounter (HOSPITAL_COMMUNITY): Payer: Self-pay | Admitting: Orthopedic Surgery

## 2021-11-12 DIAGNOSIS — M1611 Unilateral primary osteoarthritis, right hip: Principal | ICD-10-CM | POA: Insufficient documentation

## 2021-11-12 DIAGNOSIS — Z96649 Presence of unspecified artificial hip joint: Secondary | ICD-10-CM

## 2021-11-12 DIAGNOSIS — I341 Nonrheumatic mitral (valve) prolapse: Secondary | ICD-10-CM | POA: Diagnosis not present

## 2021-11-12 DIAGNOSIS — Z853 Personal history of malignant neoplasm of breast: Secondary | ICD-10-CM | POA: Insufficient documentation

## 2021-11-12 DIAGNOSIS — Z79899 Other long term (current) drug therapy: Secondary | ICD-10-CM | POA: Insufficient documentation

## 2021-11-12 DIAGNOSIS — Z96641 Presence of right artificial hip joint: Secondary | ICD-10-CM

## 2021-11-12 DIAGNOSIS — D6851 Activated protein C resistance: Secondary | ICD-10-CM | POA: Diagnosis not present

## 2021-11-12 DIAGNOSIS — Z20822 Contact with and (suspected) exposure to covid-19: Secondary | ICD-10-CM | POA: Insufficient documentation

## 2021-11-12 DIAGNOSIS — Z419 Encounter for procedure for purposes other than remedying health state, unspecified: Secondary | ICD-10-CM

## 2021-11-12 HISTORY — PX: TOTAL HIP ARTHROPLASTY: SHX124

## 2021-11-12 LAB — SARS CORONAVIRUS 2 BY RT PCR (HOSPITAL ORDER, PERFORMED IN ~~LOC~~ HOSPITAL LAB): SARS Coronavirus 2: NEGATIVE

## 2021-11-12 LAB — TYPE AND SCREEN
ABO/RH(D): A POS
Antibody Screen: NEGATIVE

## 2021-11-12 SURGERY — ARTHROPLASTY, HIP, TOTAL, ANTERIOR APPROACH
Anesthesia: Monitor Anesthesia Care | Site: Hip | Laterality: Right

## 2021-11-12 MED ORDER — LACTATED RINGERS IV SOLN: INTRAVENOUS | Status: DC

## 2021-11-12 MED ORDER — HYDROCODONE-ACETAMINOPHEN 5-325 MG PO TABS
1.0000 | ORAL_TABLET | ORAL | Status: DC | PRN
  Administered 2021-11-12 – 2021-11-13 (×4): 1 via ORAL
  Filled 2021-11-12 (×4): qty 1

## 2021-11-12 MED ORDER — 0.9 % SODIUM CHLORIDE (POUR BTL) OPTIME
TOPICAL | Status: DC | PRN
  Administered 2021-11-12: 09:00:00 1000 mL

## 2021-11-12 MED ORDER — FENTANYL CITRATE (PF) 100 MCG/2ML IJ SOLN
INTRAMUSCULAR | Status: DC | PRN
  Administered 2021-11-12: 50 ug via INTRAVENOUS

## 2021-11-12 MED ORDER — CHLORHEXIDINE GLUCONATE 0.12 % MT SOLN
15.0000 mL | Freq: Once | OROMUCOSAL | Status: AC
  Administered 2021-11-12: 07:00:00 15 mL via OROMUCOSAL

## 2021-11-12 MED ORDER — RIVAROXABAN 10 MG PO TABS
10.0000 mg | ORAL_TABLET | Freq: Every day | ORAL | Status: DC
  Administered 2021-11-13: 09:00:00 10 mg via ORAL
  Filled 2021-11-12: qty 1

## 2021-11-12 MED ORDER — EPHEDRINE SULFATE-NACL 50-0.9 MG/10ML-% IV SOSY
PREFILLED_SYRINGE | INTRAVENOUS | Status: DC | PRN
  Administered 2021-11-12 (×2): 5 mg via INTRAVENOUS
  Administered 2021-11-12: 10 mg via INTRAVENOUS

## 2021-11-12 MED ORDER — ACETAMINOPHEN 160 MG/5ML PO SOLN: 325.0000 mg | ORAL | Status: DC | PRN

## 2021-11-12 MED ORDER — POVIDONE-IODINE 10 % EX SWAB
2.0000 "application " | Freq: Once | CUTANEOUS | Status: AC
  Administered 2021-11-12: 2 via TOPICAL

## 2021-11-12 MED ORDER — PHENYLEPHRINE HCL-NACL 20-0.9 MG/250ML-% IV SOLN
INTRAVENOUS | Status: AC
  Filled 2021-11-12: qty 250

## 2021-11-12 MED ORDER — ONDANSETRON HCL 4 MG/2ML IJ SOLN
INTRAMUSCULAR | Status: DC | PRN
  Administered 2021-11-12: 4 mg via INTRAVENOUS

## 2021-11-12 MED ORDER — ONDANSETRON HCL 4 MG/2ML IJ SOLN: 4.0000 mg | Freq: Once | INTRAMUSCULAR | Status: DC | PRN

## 2021-11-12 MED ORDER — PHENOL 1.4 % MT LIQD: 1.0000 | OROMUCOSAL | Status: DC | PRN

## 2021-11-12 MED ORDER — ONDANSETRON HCL 4 MG/2ML IJ SOLN
INTRAMUSCULAR | Status: AC
  Filled 2021-11-12: qty 2

## 2021-11-12 MED ORDER — OXYCODONE HCL 5 MG PO TABS: 5.0000 mg | ORAL_TABLET | Freq: Once | ORAL | Status: DC | PRN

## 2021-11-12 MED ORDER — PROPOFOL 10 MG/ML IV BOLUS
INTRAVENOUS | Status: DC | PRN
  Administered 2021-11-12: 10 mg via INTRAVENOUS
  Administered 2021-11-12: 30 mg via INTRAVENOUS
  Administered 2021-11-12: 20 mg via INTRAVENOUS

## 2021-11-12 MED ORDER — ACETAMINOPHEN 10 MG/ML IV SOLN
1000.0000 mg | Freq: Four times a day (QID) | INTRAVENOUS | Status: DC
Start: 2021-11-12 — End: 2021-11-12
  Administered 2021-11-12: 09:00:00 1000 mg via INTRAVENOUS
  Filled 2021-11-12: qty 100

## 2021-11-12 MED ORDER — TRANEXAMIC ACID-NACL 1000-0.7 MG/100ML-% IV SOLN
1000.0000 mg | INTRAVENOUS | Status: AC
  Administered 2021-11-12: 09:00:00 1000 mg via INTRAVENOUS
  Filled 2021-11-12: qty 100

## 2021-11-12 MED ORDER — ONDANSETRON HCL 4 MG/2ML IJ SOLN: 4.0000 mg | Freq: Four times a day (QID) | INTRAMUSCULAR | Status: DC | PRN

## 2021-11-12 MED ORDER — METHOCARBAMOL 500 MG IVPB - SIMPLE MED
500.0000 mg | Freq: Four times a day (QID) | INTRAVENOUS | Status: DC | PRN
  Filled 2021-11-12: qty 50

## 2021-11-12 MED ORDER — MORPHINE SULFATE (PF) 2 MG/ML IV SOLN: 0.5000 mg | INTRAVENOUS | Status: DC | PRN

## 2021-11-12 MED ORDER — FENTANYL CITRATE (PF) 100 MCG/2ML IJ SOLN
INTRAMUSCULAR | Status: AC
  Filled 2021-11-12: qty 2

## 2021-11-12 MED ORDER — CEFAZOLIN SODIUM-DEXTROSE 2-4 GM/100ML-% IV SOLN
2.0000 g | INTRAVENOUS | Status: AC
  Administered 2021-11-12: 08:00:00 2 g via INTRAVENOUS
  Filled 2021-11-12: qty 100

## 2021-11-12 MED ORDER — ORAL CARE MOUTH RINSE: 15.0000 mL | Freq: Once | OROMUCOSAL | Status: AC

## 2021-11-12 MED ORDER — METOCLOPRAMIDE HCL 5 MG/ML IJ SOLN: 5.0000 mg | Freq: Three times a day (TID) | INTRAMUSCULAR | Status: DC | PRN

## 2021-11-12 MED ORDER — BUPIVACAINE-EPINEPHRINE (PF) 0.25% -1:200000 IJ SOLN
INTRAMUSCULAR | Status: AC
  Filled 2021-11-12: qty 30

## 2021-11-12 MED ORDER — ACETAMINOPHEN 325 MG PO TABS
325.0000 mg | ORAL_TABLET | Freq: Four times a day (QID) | ORAL | Status: DC | PRN
  Administered 2021-11-13: 07:00:00 650 mg via ORAL
  Filled 2021-11-12 (×2): qty 2

## 2021-11-12 MED ORDER — METHOCARBAMOL 500 MG PO TABS
500.0000 mg | ORAL_TABLET | Freq: Four times a day (QID) | ORAL | Status: DC | PRN
  Administered 2021-11-12 – 2021-11-13 (×2): 500 mg via ORAL
  Filled 2021-11-12 (×2): qty 1

## 2021-11-12 MED ORDER — DOCUSATE SODIUM 100 MG PO CAPS
100.0000 mg | ORAL_CAPSULE | Freq: Two times a day (BID) | ORAL | Status: DC
  Administered 2021-11-12 – 2021-11-13 (×3): 100 mg via ORAL
  Filled 2021-11-12 (×3): qty 1

## 2021-11-12 MED ORDER — ACETAMINOPHEN 325 MG PO TABS: 325.0000 mg | ORAL_TABLET | ORAL | Status: DC | PRN

## 2021-11-12 MED ORDER — EZETIMIBE 10 MG PO TABS
10.0000 mg | ORAL_TABLET | Freq: Every day | ORAL | Status: DC
  Administered 2021-11-12 – 2021-11-13 (×2): 10 mg via ORAL
  Filled 2021-11-12 (×2): qty 1

## 2021-11-12 MED ORDER — WATER FOR IRRIGATION, STERILE IR SOLN
Status: DC | PRN
  Administered 2021-11-12: 2000 mL

## 2021-11-12 MED ORDER — PHENYLEPHRINE HCL-NACL 20-0.9 MG/250ML-% IV SOLN
INTRAVENOUS | Status: DC | PRN
  Administered 2021-11-12: 25 ug/min via INTRAVENOUS

## 2021-11-12 MED ORDER — DEXAMETHASONE SODIUM PHOSPHATE 10 MG/ML IJ SOLN
INTRAMUSCULAR | Status: AC
  Filled 2021-11-12: qty 1

## 2021-11-12 MED ORDER — HYDROCODONE-ACETAMINOPHEN 7.5-325 MG PO TABS: 1.0000 | ORAL_TABLET | ORAL | Status: DC | PRN

## 2021-11-12 MED ORDER — ATORVASTATIN CALCIUM 10 MG PO TABS
10.0000 mg | ORAL_TABLET | Freq: Every day | ORAL | Status: DC
  Administered 2021-11-12: 17:00:00 10 mg via ORAL
  Filled 2021-11-12: qty 1

## 2021-11-12 MED ORDER — SODIUM CHLORIDE 0.9 % IV SOLN: INTRAVENOUS | Status: DC

## 2021-11-12 MED ORDER — BUPIVACAINE IN DEXTROSE 0.75-8.25 % IT SOLN
INTRATHECAL | Status: DC | PRN
  Administered 2021-11-12: 1.6 mL via INTRATHECAL

## 2021-11-12 MED ORDER — POLYETHYLENE GLYCOL 3350 17 G PO PACK: 17.0000 g | PACK | Freq: Every day | ORAL | Status: DC | PRN

## 2021-11-12 MED ORDER — BUPIVACAINE HCL 0.25 % IJ SOLN: INTRAMUSCULAR | Status: DC | PRN

## 2021-11-12 MED ORDER — OXYCODONE HCL 5 MG/5ML PO SOLN: 5.0000 mg | Freq: Once | ORAL | Status: DC | PRN

## 2021-11-12 MED ORDER — DEXAMETHASONE SODIUM PHOSPHATE 10 MG/ML IJ SOLN
8.0000 mg | Freq: Once | INTRAMUSCULAR | Status: AC
  Administered 2021-11-12: 09:00:00 5 mg via INTRAVENOUS

## 2021-11-12 MED ORDER — DEXAMETHASONE SODIUM PHOSPHATE 10 MG/ML IJ SOLN
10.0000 mg | Freq: Once | INTRAMUSCULAR | Status: AC
  Administered 2021-11-13: 09:00:00 10 mg via INTRAVENOUS
  Filled 2021-11-12: qty 1

## 2021-11-12 MED ORDER — PROPOFOL 1000 MG/100ML IV EMUL
INTRAVENOUS | Status: AC
  Filled 2021-11-12: qty 100

## 2021-11-12 MED ORDER — BUPIVACAINE-EPINEPHRINE (PF) 0.25% -1:200000 IJ SOLN
INTRAMUSCULAR | Status: DC | PRN
  Administered 2021-11-12: 30 mL via PERINEURAL

## 2021-11-12 MED ORDER — MENTHOL 3 MG MT LOZG: 1.0000 | LOZENGE | OROMUCOSAL | Status: DC | PRN

## 2021-11-12 MED ORDER — ONDANSETRON HCL 4 MG PO TABS: 4.0000 mg | ORAL_TABLET | Freq: Four times a day (QID) | ORAL | Status: DC | PRN

## 2021-11-12 MED ORDER — BISACODYL 10 MG RE SUPP: 10.0000 mg | Freq: Every day | RECTAL | Status: DC | PRN

## 2021-11-12 MED ORDER — PHENYLEPHRINE 40 MCG/ML (10ML) SYRINGE FOR IV PUSH (FOR BLOOD PRESSURE SUPPORT)
PREFILLED_SYRINGE | INTRAVENOUS | Status: DC | PRN
  Administered 2021-11-12: 40 ug via INTRAVENOUS

## 2021-11-12 MED ORDER — FENTANYL CITRATE PF 50 MCG/ML IJ SOSY: 25.0000 ug | PREFILLED_SYRINGE | INTRAMUSCULAR | Status: DC | PRN

## 2021-11-12 MED ORDER — PROPOFOL 500 MG/50ML IV EMUL
INTRAVENOUS | Status: DC | PRN
Start: 2021-11-12 — End: 2021-11-12
  Administered 2021-11-12: 50 ug/kg/min via INTRAVENOUS

## 2021-11-12 MED ORDER — CEFAZOLIN SODIUM-DEXTROSE 2-4 GM/100ML-% IV SOLN
2.0000 g | Freq: Four times a day (QID) | INTRAVENOUS | Status: AC
  Administered 2021-11-12 (×2): 2 g via INTRAVENOUS
  Filled 2021-11-12 (×2): qty 100

## 2021-11-12 MED ORDER — METOCLOPRAMIDE HCL 5 MG PO TABS: 5.0000 mg | ORAL_TABLET | Freq: Three times a day (TID) | ORAL | Status: DC | PRN

## 2021-11-12 MED ORDER — MEPERIDINE HCL 50 MG/ML IJ SOLN: 6.2500 mg | INTRAMUSCULAR | Status: DC | PRN

## 2021-11-12 SURGICAL SUPPLY — 47 items
BAG COUNTER SPONGE SURGICOUNT (BAG) IMPLANT
BAG DECANTER FOR FLEXI CONT (MISCELLANEOUS) IMPLANT
BAG SPEC THK2 15X12 ZIP CLS (MISCELLANEOUS)
BAG SPNG CNTER NS LX DISP (BAG)
BAG SURGICOUNT SPONGE COUNTING (BAG)
BAG ZIPLOCK 12X15 (MISCELLANEOUS) IMPLANT
BLADE SAG 18X100X1.27 (BLADE) ×3 IMPLANT
CLOSURE WOUND 1/2 X4 (GAUZE/BANDAGES/DRESSINGS) ×2
COVER PERINEAL POST (MISCELLANEOUS) ×3 IMPLANT
COVER SURGICAL LIGHT HANDLE (MISCELLANEOUS) ×3 IMPLANT
CUP ACETBLR 48 OD SECTOR II (Hips) ×2 IMPLANT
DECANTER SPIKE VIAL GLASS SM (MISCELLANEOUS) ×3 IMPLANT
DRAPE FOOT SWITCH (DRAPES) ×3 IMPLANT
DRAPE STERI IOBAN 125X83 (DRAPES) ×3 IMPLANT
DRAPE U-SHAPE 47X51 STRL (DRAPES) ×6 IMPLANT
DRESSING AQUACEL AG SP 3.5X10 (GAUZE/BANDAGES/DRESSINGS) IMPLANT
DRSG AQUACEL AG ADV 3.5X10 (GAUZE/BANDAGES/DRESSINGS) ×3 IMPLANT
DRSG AQUACEL AG SP 3.5X10 (GAUZE/BANDAGES/DRESSINGS) ×3
DURAPREP 26ML APPLICATOR (WOUND CARE) ×3 IMPLANT
ELECT REM PT RETURN 15FT ADLT (MISCELLANEOUS) ×3 IMPLANT
GLOVE SRG 8 PF TXTR STRL LF DI (GLOVE) ×1 IMPLANT
GLOVE SURG ENC MOIS LTX SZ6.5 (GLOVE) ×3 IMPLANT
GLOVE SURG ENC MOIS LTX SZ7 (GLOVE) ×3 IMPLANT
GLOVE SURG ENC MOIS LTX SZ8 (GLOVE) ×6 IMPLANT
GLOVE SURG UNDER POLY LF SZ7 (GLOVE) ×3 IMPLANT
GLOVE SURG UNDER POLY LF SZ8 (GLOVE) ×3
GLOVE SURG UNDER POLY LF SZ8.5 (GLOVE) IMPLANT
GOWN STRL REUS W/TWL LRG LVL3 (GOWN DISPOSABLE) ×6 IMPLANT
GOWN STRL REUS W/TWL XL LVL3 (GOWN DISPOSABLE) IMPLANT
HEAD FEM STD 28X+8.5 STRL (Hips) ×2 IMPLANT
HOLDER FOLEY CATH W/STRAP (MISCELLANEOUS) ×3 IMPLANT
KIT TURNOVER KIT A (KITS) IMPLANT
MANIFOLD NEPTUNE II (INSTRUMENTS) ×3 IMPLANT
PACK ANTERIOR HIP CUSTOM (KITS) ×3 IMPLANT
PENCIL SMOKE EVACUATOR COATED (MISCELLANEOUS) ×3 IMPLANT
PINNACLE ALTRX NEUT IX OD28X48 ×2 IMPLANT
STEM FEMORAL SZ5 HIGH ACTIS (Stem) ×2 IMPLANT
STRIP CLOSURE SKIN 1/2X4 (GAUZE/BANDAGES/DRESSINGS) ×3 IMPLANT
SUT ETHIBOND NAB CT1 #1 30IN (SUTURE) ×3 IMPLANT
SUT MNCRL AB 4-0 PS2 18 (SUTURE) ×3 IMPLANT
SUT STRATAFIX 0 PDS 27 VIOLET (SUTURE) ×3
SUT VIC AB 2-0 CT1 27 (SUTURE) ×6
SUT VIC AB 2-0 CT1 TAPERPNT 27 (SUTURE) ×2 IMPLANT
SUTURE STRATFX 0 PDS 27 VIOLET (SUTURE) ×1 IMPLANT
SYR 50ML LL SCALE MARK (SYRINGE) IMPLANT
TRAY FOLEY MTR SLVR 16FR STAT (SET/KITS/TRAYS/PACK) ×3 IMPLANT
TUBE SUCTION HIGH CAP CLEAR NV (SUCTIONS) ×3 IMPLANT

## 2021-11-12 NOTE — Interval H&P Note (Signed)
History and Physical Interval Note:  11/12/2021 8:11 AM  Terressa Koyanagi  has presented today for surgery, with the diagnosis of right hip osteoarthritis.  The various methods of treatment have been discussed with the patient and family. After consideration of risks, benefits and other options for treatment, the patient has consented to  Procedure(s): TOTAL HIP ARTHROPLASTY ANTERIOR APPROACH (Right) as a surgical intervention.  The patient's history has been reviewed, patient examined, no change in status, stable for surgery.  I have reviewed the patient's chart and labs.  Questions were answered to the patient's satisfaction.     Pilar Plate Dametria Tuzzolino

## 2021-11-12 NOTE — Op Note (Signed)
OPERATIVE REPORT- TOTAL HIP ARTHROPLASTY   PREOPERATIVE DIAGNOSIS: Osteoarthritis of the Right hip.   POSTOPERATIVE DIAGNOSIS: Osteoarthritis of the Right  hip.   PROCEDURE: Right total hip arthroplasty, anterior approach.   SURGEON: Gaynelle Arabian, MD   ASSISTANT: Fenton Foy, PA-C  ANESTHESIA:  Spinal  ESTIMATED BLOOD LOSS:-300 mL    DRAINS: Hemovac x1.   COMPLICATIONS: None   CONDITION: PACU - hemodynamically stable.   BRIEF CLINICAL NOTE: Laura Galvan is a 75 y.o. female who has advanced end-  stage arthritis of their Right  hip with progressively worsening pain and  dysfunction.The patient has failed nonoperative management and presents for  total hip arthroplasty.   PROCEDURE IN DETAIL: After successful administration of spinal  anesthetic, the traction boots for the [facility] bed were placed on both  feet and the patient was placed onto the [facility] bed, boots placed into the leg  holders. The Right hip was then isolated from the perineum with plastic  drapes and prepped and draped in the usual sterile fashion. ASIS and  greater trochanter were marked and a oblique incision was made, starting  at about 1 cm lateral and 2 cm distal to the ASIS and coursing towards  the anterior cortex of the femur. The skin was cut with a 10 blade  through subcutaneous tissue to the level of the fascia overlying the  tensor fascia lata muscle. The fascia was then incised in line with the  incision at the junction of the anterior third and posterior 2/3rd. The  muscle was teased off the fascia and then the interval between the TFL  and the rectus was developed. The Hohmann retractor was then placed at  the top of the femoral neck over the capsule. The vessels overlying the  capsule were cauterized and the fat on top of the capsule was removed.  A Hohmann retractor was then placed anterior underneath the rectus  femoris to give exposure to the entire anterior capsule. A T-shaped   capsulotomy was performed. The edges were tagged and the femoral head  was identified.       Osteophytes are removed off the superior acetabulum.  The femoral neck was then cut in situ with an oscillating saw. Traction  was then applied to the left lower extremity utilizing the [facility]  traction. The femoral head was then removed. Retractors were placed  around the acetabulum and then circumferential removal of the labrum was  performed. Osteophytes were also removed. Reaming starts at 45 mm to  medialize and  Increased in 2 mm increments to 47 mm. We reamed in  approximately 40 degrees of abduction, 20 degrees anteversion. A 48 mm  pinnacle acetabular shell was then impacted in anatomic position under  fluoroscopic guidance with excellent purchase. We did not need to place  any additional dome screws. A 28 mm neutral marathon liner was then  placed into the acetabular shell.       The femoral lift was then placed along the lateral aspect of the femur  just distal to the vastus ridge. The leg was  externally rotated and capsule  was stripped off the inferior aspect of the femoral neck down to the  level of the lesser trochanter, this was done with electrocautery. The femur was lifted after this was performed. The  leg was then placed in an extended and adducted position essentially delivering the femur. We also removed the capsule superiorly and the piriformis from the piriformis fossa to gain  excellent exposure of the  proximal femur. Rongeur was used to remove some cancellous bone to get  into the lateral portion of the proximal femur for placement of the  initial starter reamer. The starter broaches was placed  the starter broach  and was shown to go down the center of the canal. Broaching  with the Actis system was then performed starting at size 0  coursing  Up to size 5. A size 5 had excellent torsional and rotational  and axial stability. The trial high offset neck was then placed   with a 28 + 8.5 trial head. The hip was then reduced. We confirmed that  the stem was in the canal both on AP and lateral x-rays. It also has excellent sizing. The hip was reduced with outstanding stability through full extension and full external rotation.. AP pelvis was taken and the leg lengths were measured and found to be equal. Hip was then dislocated again and the femoral head and neck removed. The  femoral broach was removed. Size 5 Actis stem with a high offset  neck was then impacted into the femur following native anteversion. Has  excellent purchase in the canal. Excellent torsional and rotational and  axial stability. It is confirmed to be in the canal on AP and lateral  fluoroscopic views. The 28 + 8.5 metal head was placed and the hip  reduced with outstanding stability. Again AP pelvis was taken and it  confirmed that the leg lengths were equal. The wound was then copiously  irrigated with saline solution and the capsule reattached and repaired  with Ethibond suture. 30 ml of .25% Bupivicaine was  injected into the capsule and into the edge of the tensor fascia lata as well as subcutaneous tissue. The fascia overlying the tensor fascia lata was then closed with a running #1 V-Loc. Subcu was closed with interrupted 2-0 Vicryl and subcuticular running 4-0 Monocryl. Incision was cleaned  and dried. Steri-Strips and a bulky sterile dressing applied. The patient was awakened and transported to  recovery in stable condition.        Please note that a surgical assistant was a medical necessity for this procedure to perform it in a safe and expeditious manner. Assistant was necessary to provide appropriate retraction of vital neurovascular structures and to prevent femoral fracture and allow for anatomic placement of the prosthesis.  Gaynelle Arabian, M.D.

## 2021-11-12 NOTE — Plan of Care (Signed)

## 2021-11-12 NOTE — Transfer of Care (Signed)
Immediate Anesthesia Transfer of Care Note  Patient: Laura Galvan  Procedure(s) Performed: TOTAL HIP ARTHROPLASTY ANTERIOR APPROACH (Right: Hip)  Patient Location: PACU  Anesthesia Type:MAC and Spinal  Level of Consciousness: awake, alert  and patient cooperative  Airway & Oxygen Therapy: Patient Spontanous Breathing and Patient connected to face mask oxygen  Post-op Assessment: Report given to RN and Post -op Vital signs reviewed and stable  Post vital signs: Reviewed and stable  Last Vitals:  Vitals Value Taken Time  BP 104/58 11/12/21 0956  Temp    Pulse 69 11/12/21 0957  Resp 15 11/12/21 0957  SpO2 100 % 11/12/21 0957  Vitals shown include unvalidated device data.  Last Pain:  Vitals:   11/12/21 0708  TempSrc: Oral  PainSc:          Complications: No notable events documented.

## 2021-11-12 NOTE — Anesthesia Procedure Notes (Signed)
Procedure Name: MAC Date/Time: 11/12/2021 8:19 AM Performed by: West Pugh, CRNA Pre-anesthesia Checklist: Patient identified, Emergency Drugs available, Suction available, Patient being monitored and Timeout performed Patient Re-evaluated:Patient Re-evaluated prior to induction Oxygen Delivery Method: Simple face mask Preoxygenation: Pre-oxygenation with 100% oxygen Placement Confirmation: positive ETCO2 Dental Injury: Teeth and Oropharynx as per pre-operative assessment

## 2021-11-12 NOTE — Anesthesia Procedure Notes (Signed)
Spinal  Patient location during procedure: OR Start time: 11/12/2021 8:19 AM End time: 11/12/2021 8:23 AM Reason for block: surgical anesthesia Staffing Performed: resident/CRNA  Anesthesiologist: Janeece Riggers, MD Resident/CRNA: West Pugh, CRNA Preanesthetic Checklist Completed: patient identified, IV checked, site marked, risks and benefits discussed, surgical consent, monitors and equipment checked, pre-op evaluation and timeout performed Spinal Block Patient position: sitting Prep: DuraPrep and site prepped and draped Patient monitoring: heart rate, continuous pulse ox and blood pressure Approach: midline Location: L3-4 Injection technique: single-shot Needle Needle type: Pencan and Introducer  Needle gauge: 24 G Needle length: 10 cm Assessment Sensory level: T4 Events: CSF return Additional Notes IV functioning, monitors applied to pt. Expiration date of kit checked and confirmed to be in date. Sterile prep and drape, hand hygiene and sterile gloved used. Pt was positioned and spine was prepped in sterile fashion. Skin was anesthetized with lidocaine. Free flow of clear CSF obtained prior to injecting local anesthetic into CSF x 1 attempt. Spinal needle aspirated freely following injection. Needle was carefully withdrawn, and pt tolerated procedure well. Loss of motor and sensory on exam post injection. Dr Ambrose Pancoast present for procedure.

## 2021-11-12 NOTE — Evaluation (Signed)
Physical Therapy Evaluation Patient Details Name: Laura Galvan MRN: [id number] DOB: 02-06-1946 Today's Date: 11/12/2021  History of Present Illness  Patient is 75 y.o. female s/p Rt THA anterior approach on 11/12/21 with PMH significant for breast cancer, HTN, thrombophiollia, Rt RCR.    Clinical Impression  Nastacia Raybuck is a 75 y.o. female POD 0 s/p Rt THA. Patient reports independence with mobility at baseline. Patient is now limited by functional impairments (see PT problem list below) and requires min assist for transfers and gait with RW. Patient was able to ambulate ~40 feet with RW and  min assist. Patient instructed in exercise to facilitate circulation to manage edema and reduce risk of DVT. Patient will benefit from continued skilled PT interventions to address impairments and progress towards PLOF. Acute PT will follow to progress mobility and stair training in preparation for safe discharge home.        Recommendations for follow up therapy are one component of a multi-disciplinary discharge planning process, led by the attending physician.  Recommendations may be updated based on patient status, additional functional criteria and insurance authorization.  Follow Up Recommendations Follow physician's recommendations for discharge plan and follow up therapies    Assistance Recommended at Discharge Frequent or constant Supervision/Assistance  Functional Status Assessment Patient has had a recent decline in their functional status and demonstrates the ability to make significant improvements in function in a reasonable and predictable amount of time.  Equipment Recommendations  Rolling walker (2 wheels) (youth)    Recommendations for Other Services       Precautions / Restrictions Precautions Precautions: Fall Restrictions Weight Bearing Restrictions: No RLE Weight Bearing: Weight bearing as tolerated      Mobility  Bed Mobility Overal bed mobility: Needs Assistance Bed  Mobility: Supine to Sit     Supine to sit: Min assist;HOB elevated     General bed mobility comments: cues to use bed rail to raise trunk and assist with bed pad to pivot hips and scoot to edge.    Transfers Overall transfer level: Needs assistance Equipment used: Rolling walker (2 wheels) Transfers: Sit to/from Stand Sit to Stand: Min assist;From elevated surface           General transfer comment: cues for hand placement for power up and assist to steady with rise to walker. EOB elevated slightly.    Ambulation/Gait Ambulation/Gait assistance: Min assist Gait Distance (Feet): 40 Feet Assistive device: Rolling walker (2 wheels) Gait Pattern/deviations: Step-to pattern;Decreased stride length;Decreased weight shift to right Gait velocity: decr     General Gait Details: cues for safe step to pattern, assist to manage walker position and steady due to slight hip weakness.  Stairs            Wheelchair Mobility    Modified Rankin (Stroke Patients Only)       Balance Overall balance assessment: Needs assistance Sitting-balance support: Feet supported Sitting balance-Leahy Scale: Good     Standing balance support: Reliant on assistive device for balance;During functional activity;Bilateral upper extremity supported Standing balance-Leahy Scale: Poor                               Pertinent Vitals/Pain Pain Assessment: 0-10 Pain Score: 1  Pain Location: Rt hip Pain Descriptors / Indicators: Aching;Discomfort Pain Intervention(s): Limited activity within patient's tolerance;Monitored during session;Repositioned;Ice applied;Premedicated before session    Home Living Family/patient expects to be discharged to:: Private residence Living Arrangements: Spouse/significant  other Available Help at Discharge: Family Type of Home: House Home Access: Stairs to enter Entrance Stairs-Rails: Right Entrance Stairs-Number of Steps: 4 Alternate Level  Stairs-Number of Steps: does not go upstairs Home Layout: Two level;Full bath on main level;Able to live on main level with bedroom/bathroom Home Equipment: Rolling Walker (2 wheels);Cane - single point;BSC/3in1;Shower seat Additional Comments: son staying until the 26th and then someone else will come the 30th to New years day. pt's husband is independent with mobility but she does all the homemkaing    Prior Function Prior Level of Function : Independent/Modified Independent                     Hand Dominance   Dominant Hand: Right    Extremity/Trunk Assessment   Upper Extremity Assessment Upper Extremity Assessment: Overall WFL for tasks assessed    Lower Extremity Assessment Lower Extremity Assessment: Overall WFL for tasks assessed    Cervical / Trunk Assessment Cervical / Trunk Assessment: Normal  Communication   Communication: No difficulties  Cognition Arousal/Alertness: Awake/alert Behavior During Therapy: WFL for tasks assessed/performed Overall Cognitive Status: Within Functional Limits for tasks assessed                                          General Comments      Exercises Total Joint Exercises Ankle Circles/Pumps: AROM;Both;20 reps;Seated   Assessment/Plan    PT Assessment Patient needs continued PT services  PT Problem List Decreased strength;Decreased range of motion;Decreased activity tolerance;Decreased balance;Decreased mobility;Decreased knowledge of use of DME;Decreased knowledge of precautions;Pain       PT Treatment Interventions DME instruction;Gait training;Stair training;Functional mobility training;Therapeutic activities;Therapeutic exercise;Balance training;Patient/family education    PT Goals (Current goals can be found in the Care Plan section)  Acute Rehab PT Goals Patient Stated Goal: get recovered and back to independence with mobility PT Goal Formulation: With patient Time For Goal Achievement:  11/19/21 Potential to Achieve Goals: Good    Frequency 7X/week   Barriers to discharge        Co-evaluation               AM-PAC PT "6 Clicks" Mobility  Outcome Measure Help needed turning from your back to your side while in a flat bed without using bedrails?: A Little Help needed moving from lying on your back to sitting on the side of a flat bed without using bedrails?: A Little Help needed moving to and from a bed to a chair (including a wheelchair)?: A Little Help needed standing up from a chair using your arms (e.g., wheelchair or bedside chair)?: A Little Help needed to walk in hospital room?: A Little Help needed climbing 3-5 steps with a railing? : A Little 6 Click Score: 18    End of Session Equipment Utilized During Treatment: Gait belt Activity Tolerance: Patient tolerated treatment well Patient left: in chair;with call bell/phone within reach;with chair alarm set Nurse Communication: Mobility status PT Visit Diagnosis: Muscle weakness (generalized) (M62.81);Difficulty in walking, not elsewhere classified (R26.2)    Time: 6578-4696 PT Time Calculation (min) (ACUTE ONLY): 25 min   Charges:   PT Evaluation $PT Eval Low Complexity: 1 Low PT Treatments $Gait Training: 8-22 mins        Verner Mould, DPT Acute Rehabilitation Services Office [phone] Pager [phone]   Jacques Navy 11/12/2021, 3:52 PM

## 2021-11-12 NOTE — Discharge Instructions (Addendum)
Gaynelle Arabian, MD Total Joint Specialist [facility] [place] [address] [place], [place] [address] [phone]  ANTERIOR APPROACH TOTAL HIP REPLACEMENT POSTOPERATIVE DIRECTIONS     Hip Rehabilitation, Guidelines Following Surgery  The results of a hip operation are greatly improved after range of motion and muscle strengthening exercises. Follow all safety measures which are given to protect your hip. If any of these exercises cause increased pain or swelling in your joint, decrease the amount until you are comfortable again. Then slowly increase the exercises. Call your caregiver if you have problems or questions.   BLOOD CLOT PREVENTION Take a 10 mg Xarelto once a day for three weeks following surgery. Then resume one 81 mg aspirin once a day. You may resume your vitamins/supplements once you have discontinued the Xarelto. Do not take any NSAIDs (Advil, Aleve, Ibuprofen, Meloxicam, etc.) until you have discontinued the Xarelto.   HOME CARE INSTRUCTIONS  Remove items at home which could result in a fall. This includes throw rugs or furniture in walking pathways.  ICE to the affected hip as frequently as 20-30 minutes an hour and then as needed for pain and swelling. Continue to use ice on the hip for pain and swelling from surgery. You may notice swelling that will progress down to the foot and ankle. This is normal after surgery. Elevate the leg when you are not up walking on it.   Continue to use the breathing machine which will help keep your temperature down.  It is common for your temperature to cycle up and down following surgery, especially at night when you are not up moving around and exerting yourself.  The breathing machine keeps your lungs expanded and your temperature down.  DIET You may resume your previous home diet once your are discharged from the hospital.  DRESSING / WOUND CARE / SHOWERING You have an adhesive waterproof bandage over the  incision. Leave this in place until your first follow-up appointment. Once you remove this you will not need to place another bandage.  You may begin showering 3 days following surgery, but do not submerge the incision under water.  ACTIVITY For the first 3-5 days, it is important to rest and keep the operative leg elevated. You should, as a general rule, rest for 50 minutes and walk/stretch for 10 minutes per hour. After 5 days, you may slowly increase activity as tolerated.  Perform the exercises you were provided twice a day for about 15-20 minutes each session. Begin these 2 days following surgery. Walk with your walker as instructed. Use the walker until you are comfortable transitioning to a cane. Walk with the cane in the opposite hand of the operative leg. You may discontinue the cane once you are comfortable and walking steadily. Avoid periods of inactivity such as sitting longer than an hour when not asleep. This helps prevent blood clots.  Do not drive a car for 6 weeks or until released by your surgeon.  Do not drive while taking narcotics.  TED HOSE STOCKINGS Wear the elastic stockings on both legs for three weeks following surgery during the day. You may remove them at night while sleeping.  WEIGHT BEARING Weight bearing as tolerated with assist device (walker, cane, etc) as directed, use it as long as suggested by your surgeon or therapist, typically at least 4-6 weeks.  POSTOPERATIVE CONSTIPATION PROTOCOL Constipation - defined medically as fewer than three stools per week and severe constipation as less than one stool per week.  One of the most common issues patients have following surgery is constipation.  Even if you have a regular bowel pattern at home, your normal regimen is likely to be disrupted due to multiple reasons following surgery.  Combination of anesthesia, postoperative narcotics, change in appetite and fluid intake all can affect your bowels.  In order to avoid  complications following surgery, here are some recommendations in order to help you during your recovery period.  Colace (docusate) - Pick up an over-the-counter form of Colace or another stool softener and take twice a day as long as you are requiring postoperative pain medications.  Take with a full glass of water daily.  If you experience loose stools or diarrhea, hold the colace until you stool forms back up.  If your symptoms do not get better within 1 week or if they get worse, check with your doctor. Dulcolax (bisacodyl) - Pick up over-the-counter and take as directed by the product packaging as needed to assist with the movement of your bowels.  Take with a full glass of water.  Use this product as needed if not relieved by Colace only.  MiraLax (polyethylene glycol) - Pick up over-the-counter to have on hand.  MiraLax is a solution that will increase the amount of water in your bowels to assist with bowel movements.  Take as directed and can mix with a glass of water, juice, soda, coffee, or tea.  Take if you go more than two days without a movement.Do not use MiraLax more than once per day. Call your doctor if you are still constipated or irregular after using this medication for 7 days in a row.  If you continue to have problems with postoperative constipation, please contact the office for further assistance and recommendations.  If you experience "the worst abdominal pain ever" or develop nausea or vomiting, please contact the office immediatly for further recommendations for treatment.  ITCHING  If you experience itching with your medications, try taking only a single pain pill, or even half a pain pill at a time.  You can also use Benadryl over the counter for itching or also to help with sleep.   MEDICATIONS See your medication summary on the After Visit Summary that the nursing staff will review with you prior to discharge.  You may have some home medications which will be placed on  hold until you complete the course of blood thinner medication.  It is important for you to complete the blood thinner medication as prescribed by your surgeon.  Continue your approved medications as instructed at time of discharge.  PRECAUTIONS If you experience chest pain or shortness of breath - call 911 immediately for transfer to the hospital emergency department.  If you develop a fever greater that 101 F, purulent drainage from wound, increased redness or drainage from wound, foul odor from the wound/dressing, or calf pain - CONTACT YOUR SURGEON.                                                   FOLLOW-UP APPOINTMENTS Make sure you keep all of your appointments after your operation with your surgeon and caregivers. You should call the office at the above phone number and make an appointment for approximately two weeks after the date of your surgery or on the date instructed by your surgeon outlined in  the "After Visit Summary".  RANGE OF MOTION AND STRENGTHENING EXERCISES  These exercises are designed to help you keep full movement of your hip joint. Follow your caregiver's or physical therapist's instructions. Perform all exercises about fifteen times, three times per day or as directed. Exercise both hips, even if you have had only one joint replacement. These exercises can be done on a training (exercise) mat, on the floor, on a table or on a bed. Use whatever works the best and is most comfortable for you. Use music or television while you are exercising so that the exercises are a pleasant break in your day. This will make your life better with the exercises acting as a break in routine you can look forward to.  Lying on your back, slowly slide your foot toward your buttocks, raising your knee up off the floor. Then slowly slide your foot back down until your leg is straight again.  Lying on your back spread your legs as far apart as you can without causing discomfort.  Lying on your side,  raise your upper leg and foot straight up from the floor as far as is comfortable. Slowly lower the leg and repeat.  Lying on your back, tighten up the muscle in the front of your thigh (quadriceps muscles). You can do this by keeping your leg straight and trying to raise your heel off the floor. This helps strengthen the largest muscle supporting your knee.  Lying on your back, tighten up the muscles of your buttocks both with the legs straight and with the knee bent at a comfortable angle while keeping your heel on the floor.   POST-OPERATIVE OPIOID TAPER INSTRUCTIONS: It is important to wean off of your opioid medication as soon as possible. If you do not need pain medication after your surgery it is ok to stop day one. Opioids include: Codeine, Hydrocodone(Norco, Vicodin), Oxycodone(Percocet, oxycontin) and hydromorphone amongst others.  Long term and even short term use of opiods can cause: Increased pain response Dependence Constipation Depression Respiratory depression And more.  Withdrawal symptoms can include Flu like symptoms Nausea, vomiting And more Techniques to manage these symptoms Hydrate well Eat regular healthy meals Stay active Use relaxation techniques(deep breathing, meditating, yoga) Do Not substitute Alcohol to help with tapering If you have been on opioids for less than two weeks and do not have pain than it is ok to stop all together.  Plan to wean off of opioids This plan should start within one week post op of your joint replacement. Maintain the same interval or time between taking each dose and first decrease the dose.  Cut the total daily intake of opioids by one tablet each day Next start to increase the time between doses. The last dose that should be eliminated is the evening dose.   IF YOU ARE TRANSFERRED TO A SKILLED REHAB FACILITY If the patient is transferred to a skilled rehab facility following release from the hospital, a list of the current  medications will be sent to the facility for the patient to continue.  When discharged from the skilled rehab facility, please have the facility set up the patient's [place] prior to being released. Also, the skilled facility will be responsible for providing the patient with their medications at time of release from the facility to include their pain medication, the muscle relaxants, and their blood thinner medication. If the patient is still at the rehab facility at time of the two week follow  up appointment, the skilled rehab facility will also need to assist the patient in arranging follow up appointment in our office and any transportation needs.  MAKE SURE YOU:  Understand these instructions.  Get help right away if you are not doing well or get worse.    DENTAL ANTIBIOTICS:  In most cases prophylactic antibiotics for Dental procdeures after total joint surgery are not necessary.  Exceptions are as follows:  1. History of prior total joint infection  2. Severely immunocompromised (Organ Transplant, cancer chemotherapy, Rheumatoid biologic meds such as [place])  3. Poorly controlled diabetes (A1C &gt; 8.0, blood glucose over 200)  If you have one of these conditions, contact your surgeon for an antibiotic prescription, prior to your dental procedure.    Pick up stool softner and laxative for home use following surgery while on pain medications. Do not submerge incision under water. Please use good hand washing techniques while changing dressing each day. May shower starting three days after surgery. Please use a clean towel to pat the incision dry following showers. Continue to use ice for pain and swelling after surgery. Do not use any lotions or creams on the incision until instructed by your surgeon.   Information on my medicine - XARELTO (Rivaroxaban)  Why was Xarelto prescribed for you? Xarelto was prescribed for you to reduce the risk of blood  clots forming after orthopedic surgery. The medical term for these abnormal blood clots is venous thromboembolism (VTE).  What do you need to know about xarelto ? Take your Xarelto ONCE DAILY at the same time every day. You may take it either with or without food.  If you have difficulty swallowing the tablet whole, you may crush it and mix in applesauce just prior to taking your dose.  Take Xarelto exactly as prescribed by your doctor and DO NOT stop taking Xarelto without talking to the doctor who prescribed the medication.  Stopping without other VTE prevention medication to take the place of Xarelto may increase your risk of developing a clot.  After discharge, you should have regular check-up appointments with your healthcare provider that is prescribing your Xarelto.    What do you do if you miss a dose? If you miss a dose, take it as soon as you remember on the same day then continue your regularly scheduled once daily regimen the next day. Do not take two doses of Xarelto on the same day.   Important Safety Information A possible side effect of Xarelto is bleeding. You should call your healthcare provider right away if you experience any of the following: Bleeding from an injury or your nose that does not stop. Unusual colored urine (red or dark brown) or unusual colored stools (red or black). Unusual bruising for unknown reasons. A serious fall or if you hit your head (even if there is no bleeding).  Some medicines may interact with Xarelto and might increase your risk of bleeding while on Xarelto. To help avoid this, consult your healthcare provider or pharmacist prior to using any new prescription or non-prescription medications, including herbals, vitamins, non-steroidal anti-inflammatory drugs (NSAIDs) and supplements.  This website has more information on Xarelto: [email or web address]

## 2021-11-12 NOTE — Anesthesia Postprocedure Evaluation (Signed)
Anesthesia Post Note  Patient: Laura Galvan  Procedure(s) Performed: TOTAL HIP ARTHROPLASTY ANTERIOR APPROACH (Right: Hip)     Patient location during evaluation: PACU Anesthesia Type: MAC and Spinal Level of consciousness: oriented and awake and alert Pain management: pain level controlled Vital Signs Assessment: post-procedure vital signs reviewed and stable Respiratory status: spontaneous breathing, respiratory function stable and patient connected to nasal cannula oxygen Cardiovascular status: blood pressure returned to baseline and stable Postop Assessment: no headache, no backache and no apparent nausea or vomiting Anesthetic complications: no   No notable events documented.  Last Vitals:  Vitals:   11/12/21 0708  BP: 125/77  Pulse: 66  Resp: 16  Temp: 36.8 C  SpO2: 96%    Last Pain:  Vitals:   11/12/21 0708  TempSrc: Oral  PainSc:                  [name]

## 2021-11-13 ENCOUNTER — Encounter (HOSPITAL_COMMUNITY): Payer: Self-pay | Admitting: Orthopedic Surgery

## 2021-11-13 DIAGNOSIS — M1611 Unilateral primary osteoarthritis, right hip: Secondary | ICD-10-CM | POA: Diagnosis not present

## 2021-11-13 LAB — CBC
HCT: 32.2 % — ABNORMAL LOW (ref 36.0–46.0)
Hemoglobin: 11.1 g/dL — ABNORMAL LOW (ref 12.0–15.0)
MCH: 31.8 pg (ref 26.0–34.0)
MCHC: 34.5 g/dL (ref 30.0–36.0)
MCV: 92.3 fL (ref 80.0–100.0)
Platelets: 143 10*3/uL — ABNORMAL LOW (ref 150–400)
RBC: 3.49 MIL/uL — ABNORMAL LOW (ref 3.87–5.11)
RDW: 13.3 % (ref 11.5–15.5)
WBC: 15 10*3/uL — ABNORMAL HIGH (ref 4.0–10.5)
nRBC: 0 % (ref 0.0–0.2)

## 2021-11-13 LAB — BASIC METABOLIC PANEL
Anion gap: 5 (ref 5–15)
BUN: 16 mg/dL (ref 8–23)
CO2: 28 mmol/L (ref 22–32)
Calcium: 8.3 mg/dL — ABNORMAL LOW (ref 8.9–10.3)
Chloride: 103 mmol/L (ref 98–111)
Creatinine, Ser: 0.6 mg/dL (ref 0.44–1.00)
GFR, Estimated: >60 mL/min (ref >60)
Glucose, Bld: 130 mg/dL — ABNORMAL HIGH (ref 70–99)
Potassium: 4.1 mmol/L (ref 3.5–5.1)
Sodium: 136 mmol/L (ref 135–145)

## 2021-11-13 MED ORDER — HYDROCODONE-ACETAMINOPHEN 5-325 MG PO TABS: 1.0000 | ORAL_TABLET | Freq: Four times a day (QID) | ORAL | 0 refills | Status: DC | PRN

## 2021-11-13 MED ORDER — METHOCARBAMOL 500 MG PO TABS
500.0000 mg | ORAL_TABLET | Freq: Four times a day (QID) | ORAL | 0 refills | Status: DC | PRN
Start: 2021-11-13 — End: 2024-08-15

## 2021-11-13 MED ORDER — RIVAROXABAN 10 MG PO TABS: 10.0000 mg | ORAL_TABLET | Freq: Every day | ORAL | 0 refills | Status: DC

## 2021-11-13 NOTE — TOC Transition Note (Signed)
Transition of Care [facility]) - CM/SW Discharge Note  Patient Details  Name: Zoha Spranger MRN: [id number] Date of Birth: [birth year]  [facility] of [facility]) CM/SW Contact:  Sherie Don, LCSW Phone Number: 11/13/2021, 11:00 AM  Clinical Narrative: Patient is expected to discharge home after working with PT. CSW met with patient to review discharge plan and needs. Patient will discharge home with a home exercise program (HEP). Patient has a 3N1, but will need a youth rolling walker. DME referral made to [facility] with [employer]. Walker delivered to patient's room. TOC signing off.  Final next level of care: Home/Self Care Barriers to Discharge: No Barriers Identified  Patient Goals and CMS Choice Patient states their goals for this hospitalization and ongoing recovery are:: Discharge home with HEP CMS Medicare.gov Compare Post Acute Care list provided to:: Patient Choice offered to / list presented to : Patient  Discharge Plan and Services         DME Arranged: Gilford Rile youth DME Agency: [employer] Date DME Agency Contacted: 11/13/21 Representative spoke with at DME Agency: Ovid Curd  Readmission Risk Interventions No flowsheet data found.

## 2021-11-13 NOTE — Progress Notes (Signed)
Provided discharge education/instructions, all questions answered. Youth walker delivered to room. Pt not in acute distress, discharged home with belongings accompanied by son.

## 2021-11-13 NOTE — Progress Notes (Signed)
Physical Therapy Treatment Patient Details Name: Laura Galvan MRN: [id number] DOB: [birth year] Today's Date: 11/13/2021   History of Present Illness Patient is 75 y.o. female s/p Rt THA anterior approach on 11/12/21 with PMH significant for breast cancer, HTN, thrombophiollia, Rt RCR.    PT Comments    Patient progressing well. Has met PT goals. Ready for DC.   Recommendations for follow up therapy are one component of a multi-disciplinary discharge planning process, led by the attending physician.  Recommendations may be updated based on patient status, additional functional criteria and insurance authorization.  Follow Up Recommendations  Follow physician's recommendations for discharge plan and follow up therapies     Assistance Recommended at Discharge Intermittent Supervision/Assistance  Equipment Recommendations  Rolling walker (2 wheels)    Recommendations for Other Services       Precautions / Restrictions Precautions Precautions: Fall Restrictions Weight Bearing Restrictions: Yes RLE Weight Bearing: Weight bearing as tolerated     Mobility  Bed Mobility   Bed Mobility: Sit to Supine       Sit to supine: Supervision   General bed mobility comments: use of belt to lift right leg onto bed    Transfers Overall transfer level: Needs assistance Equipment used: Rolling walker (2 wheels) Transfers: Sit to/from Stand Sit to Stand: Min guard           General transfer comment: cues for hand placement for power up and assist to steady with rise to walker. EOB elevated slightly.    Ambulation/Gait Ambulation/Gait assistance: Min guard Gait Distance (Feet): 90 Feet Assistive device: Rolling walker (2 wheels) Gait Pattern/deviations: Decreased stride length;Decreased weight shift to right;Step-through pattern       General Gait Details: cues for safe step to pattern, assist to manage walker position and steady due to slight hip weakness.   Stairs Stairs:  Yes Stairs assistance: Min assist Stair Management: One rail Right;With cane Number of Stairs: 3 (and 2) General stair comments: cues for sequence   Wheelchair Mobility    Modified Rankin (Stroke Patients Only)       Balance   Sitting-balance support: Feet supported Sitting balance-Leahy Scale: Good     Standing balance support: Reliant on assistive device for balance;During functional activity;Bilateral upper extremity supported Standing balance-Leahy Scale: Good                              Cognition Arousal/Alertness: Awake/alert                                              Exercises Total Joint Exercises Ankle Circles/Pumps: AROM;Both;20 reps;Seated Heel Slides: AAROM;Right;10 reps Hip ABduction/ADduction: AAROM;Right;10 reps    General Comments        Pertinent Vitals/Pain Pain Score: 2  Pain Location: Rt hip Pain Descriptors / Indicators: Discomfort Pain Intervention(s): Monitored during session;Premedicated before session    Home Living                          Prior Function            PT Goals (current goals can now be found in the care plan section) Progress towards PT goals: Progressing toward goals    Frequency    7X/week      PT Plan Current plan remains appropriate  Co-evaluation              AM-PAC PT "6 Clicks" Mobility   Outcome Measure  Help needed turning from your back to your side while in a flat bed without using bedrails?: A Little Help needed moving from lying on your back to sitting on the side of a flat bed without using bedrails?: A Little Help needed moving to and from a bed to a chair (including a wheelchair)?: A Little Help needed standing up from a chair using your arms (e.g., wheelchair or bedside chair)?: A Little Help needed to walk in hospital room?: A Little Help needed climbing 3-5 steps with a railing? : A Little 6 Click Score: 18    End of Session  Equipment Utilized During Treatment: Gait belt Activity Tolerance: Patient tolerated treatment well Patient left: in bed;with call bell/phone within reach;with nursing/sitter in room Nurse Communication: Mobility status PT Visit Diagnosis: Muscle weakness (generalized) (M62.81);Difficulty in walking, not elsewhere classified (R26.2)     Time: 1025-8527 PT Time Calculation (min) (ACUTE ONLY): 37 min  Charges:  $Gait Training: 8-22 mins $Therapeutic Exercise: 8-22 mins                     ,sign    Claretha Cooper 11/13/2021, 1:19 PM

## 2021-11-13 NOTE — Plan of Care (Signed)

## 2021-11-13 NOTE — Progress Notes (Signed)
° ° °  Subjective: 1 Day Post-Op Procedure(s) (LRB): TOTAL HIP ARTHROPLASTY ANTERIOR APPROACH (Right) Patient reports pain as mild.   Plan is to go Home after hospital stay.  Objective: Vital signs in last 24 hours: Temp:  [97.4 F (36.3 C)-99.8 F (37.7 C)] 99.2 F (37.3 C) (12/22 0527) Pulse Rate:  [58-76] 73 (12/22 0527) Resp:  [12-21] 16 (12/22 0527) BP: (89-131)/(53-78) 123/78 (12/22 0527) SpO2:  [93 %-100 %] 100 % (12/22 0527)  Intake/Output from previous day:  Intake/Output Summary (Last 24 hours) at 11/13/2021 0717 Last data filed at 11/13/2021 0650 Gross per 24 hour  Intake 3137.96 ml  Output 4105 ml  Net -967.04 ml    Intake/Output this shift: No intake/output data recorded.  Labs: Recent Labs    11/13/21 0329  HGB 11.1*   Recent Labs    11/13/21 0329  WBC 15.0*  RBC 3.49*  HCT 32.2*  PLT 143*   Recent Labs    11/13/21 0329  NA 136  K 4.1  CL 103  CO2 28  BUN 16  CREATININE 0.60  GLUCOSE 130*  CALCIUM 8.3*   No results for input(s): LABPT, INR in the last 72 hours.  EXAM General - Patient is Alert, Appropriate, and Disorganized Extremity - Neurologically intact Neurovascular intact Incision: dressing C/D/I No cellulitis present Dressing - dressing C/D/I Motor Function - intact, moving foot and toes well on exam.    Past Medical History:  Diagnosis Date   Breast cancer ([place]) 2012   Right   Bronchiectasis ([place]) 09/13/2015   October 2016 pulmonary function testing ratio 80%, FEV1 1.93 L (88% predicted), FVC 2.43 L (43% predicted), total lung capacity 4.33 L (88% predicted), DLCO 20.52 (89% predicted). 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   Complex valvuloplasty including quadrangular resection of posterior leaflet x2, sliding leaflet plasty, artificial Gore-tex neochord placement x12 and 34 mm Sorin Memo 3D ring annuloplasty via right mini thoracotomy approach   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

## 2021-11-26 ENCOUNTER — Ambulatory Visit: Payer: Medicare Other | Admitting: Cardiovascular Disease

## 2021-11-26 NOTE — Discharge Summary (Signed)
Physician Discharge Summary   Patient ID: Laura Galvan MRN: [id number] DOB/AGE: [birth year] 77 y.o.  Admit date: 11/12/2021 Discharge date: 11/13/2021  Primary Diagnosis: Osteoarthritis of Right Hip   Admission Diagnoses:  Past Medical History:  Diagnosis Date   Breast cancer [facility]) 2012   Right   Bronchiectasis ([place]) 09/13/2015   October 2016 pulmonary function testing ratio 80%, FEV1 1.93 L (88% predicted), FVC 2.43 L (43% predicted), total lung capacity 4.33 L (88% predicted), DLCO 20.52 (89% predicted). 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   Weight as of this encounter: 45.4 kg.  Procedure:  Procedure(s) (LRB): TOTAL HIP ARTHROPLASTY ANTERIOR APPROACH (Right)   Consults: None  HPI: Laura Galvan, 76 y.o. female, has a history of pain and functional disability in the right hip due to arthritis and patient has failed non-surgical conservative treatments for greater than 12 weeks to include NSAID's and/or analgesics, flexibility and strengthening excercises, and activity modification.  Onset of symptoms was gradual, starting  several  years ago with gradually worsening course since that time. The patient noted no past surgery on the right hip. Patient currently rates pain in the right hip at 7 out of 10 with activity. Patient has worsening of pain with activity and weight bearing, pain that interfers with activities of daily living, and pain with passive range of motion. Patient has evidence of periarticular osteophytes and joint space narrowing by imaging studies. This condition presents safety issues increasing the risk of falls. There is no current active infection.  Laboratory Data: Admission on 11/12/2021, Discharged on 11/13/2021  Component Date Value Ref Range Status   SARS Coronavirus 2 11/12/2021 NEGATIVE  NEGATIVE Final   Comment: (NOTE) SARS-CoV-2 target nucleic acids are NOT DETECTED.  The SARS-CoV-2 RNA is generally detectable in upper and lower respiratory specimens during the acute phase of infection. The lowest concentration of SARS-CoV-2 viral copies this assay can detect is 250 copies / mL. A negative result does not preclude SARS-CoV-2 infection and should not be used as the sole basis for treatment or other patient management decisions.  A negative result may occur with improper specimen collection / handling, submission of specimen other than nasopharyngeal swab, presence of viral mutation(s) within the areas targeted by this assay, and inadequate number of viral copies (<250 copies / mL). A negative result must be combined with clinical observations, patient history, and epidemiological information.  Fact Sheet for Patients:   StrictlyIdeas.no  Fact Sheet for Healthcare Providers: BankingDealers.co.za  This Galvan is not yet approved or  cleared by the [place] and has been authorized for detection and/or diagnosis of SARS-CoV-2 by FDA under an Emergency Use  Authorization (EUA).  This EUA will remain in effect (meaning this Galvan can be used) for the duration of the COVID-19 declaration under Section 564(b)(1) of the Act, 21 U.S.C. section 360bbb-3(b)(1), unless the authorization is terminated or revoked sooner.  Performed at [facility], [place] [address] [place], [place] [address]    WBC 11/13/2021 15.0 (H)  4.0 - 10.5 K/uL Final   RBC 11/13/2021 3.49 (L)  3.87 - 5.11 MIL/uL Final   Hemoglobin 11/13/2021 11.1 (L)  12.0 - 15.0 g/dL Final   HCT 11/13/2021 32.2 (L)  36.0 - 46.0 % Final   MCV 11/13/2021 92.3  80.0 - 100.0 fL Final   MCH 11/13/2021 31.8  26.0 - 34.0 pg Final   MCHC 11/13/2021 34.5  30.0 - 36.0 g/dL Final   RDW 11/13/2021 13.3  11.5 - 15.5 % Final   Platelets 11/13/2021 143 (L)  150 - 400 K/uL Final   nRBC 11/13/2021 0.0  0.0 - 0.2 % Final   Performed at [facility], [place] [address], [place], [place] [address]   Sodium 11/13/2021 136  135 - 145 mmol/L Final   Potassium 11/13/2021 4.1  3.5 - 5.1 mmol/L Final   Chloride 11/13/2021 103  98 - 111 mmol/L Final   CO2 11/13/2021 28  22 - 32 mmol/L Final   Glucose, Bld 11/13/2021 130 (H)  70 - 99 mg/dL Final   Glucose reference range applies only to samples taken after fasting for at least 8 hours.   BUN 11/13/2021 16  8 - 23 mg/dL Final   Creatinine, Ser 11/13/2021 0.60  0.44 - 1.00 mg/dL Final   Calcium 11/13/2021 8.3 (L)  8.9 - 10.3 mg/dL Final   GFR, Estimated 11/13/2021 >60  >60 mL/min Final   Comment: (NOTE) Calculated using the CKD-EPI Creatinine Equation (2021)    Anion gap 11/13/2021 5  5 - 15 Final   Performed at [facility], [place] [address] [place], [place] [address]  Hospital Outpatient Visit on 11/04/2021  Component Date Value Ref Range Status   WBC 11/04/2021 7.0  4.0 - 10.5 K/uL Final   RBC 11/04/2021 4.24  3.87 - 5.11 MIL/uL Final   Hemoglobin 11/04/2021 13.0  12.0 - 15.0 g/dL Final   HCT 11/04/2021 40.1   36.0 - 46.0 % Final   MCV 11/04/2021 94.6  80.0 - 100.0 fL Final   MCH 11/04/2021 30.7  26.0 - 34.0 pg Final   MCHC 11/04/2021 32.4  30.0 - 36.0 g/dL Final   RDW 11/04/2021 13.4  11.5 - 15.5 % Final   Platelets 11/04/2021 227  150 - 400 K/uL Final   nRBC 11/04/2021 0.0  0.0 - 0.2 % Final   Performed at [facility], [place] [address], [place], [place] [address]   Sodium 11/04/2021 139  135 - 145 mmol/L Final   Potassium 11/04/2021 4.4  3.5 - 5.1 mmol/L Final   Chloride 11/04/2021 103  98 - 111 mmol/L Final   CO2 11/04/2021 31  22 - 32 mmol/L Final   Glucose, Bld 11/04/2021 78  70 - 99 mg/dL Final   Glucose reference range applies only to samples taken after fasting for at least 8 hours.   BUN 11/04/2021 21  8 - 23 mg/dL Final   Creatinine, Ser 11/04/2021 0.52  0.44 - 1.00 mg/dL Final  Calcium 11/04/2021 8.8 (L)  8.9 - 10.3 mg/dL Final   Total Protein 11/04/2021 6.8  6.5 - 8.1 g/dL Final   Albumin 11/04/2021 3.9  3.5 - 5.0 g/dL Final   AST 11/04/2021 27  15 - 41 U/L Final   ALT 11/04/2021 27  0 - 44 U/L Final   Alkaline Phosphatase 11/04/2021 80  38 - 126 U/L Final   Total Bilirubin 11/04/2021 0.6  0.3 - 1.2 mg/dL Final   GFR, Estimated 11/04/2021 >60  >60 mL/min Final   Comment: (NOTE) Calculated using the CKD-EPI Creatinine Equation (2021)    Anion gap 11/04/2021 5  5 - 15 Final   Performed at [facility], [place] [address] [place], [place] [address]   ABO/RH(D) 11/04/2021 A POS   Final   Antibody Screen 11/04/2021 NEG   Final   Sample Expiration 11/04/2021 11/15/2021,2359   Final   Extend sample reason 11/04/2021    Final                   Value:NO TRANSFUSIONS OR PREGNANCY IN THE PAST 3 MONTHS Performed at [place] [address], [place], [place] [address]    Prothrombin Time 11/04/2021 13.5  11.4 - 15.2 seconds Final   INR 11/04/2021 1.0  0.8 - 1.2 Final   Comment: (NOTE) INR goal varies based on device and disease  states. Performed at [facility], [place] [address], [place], [place] [address]    MRSA, PCR 11/04/2021 NEGATIVE  NEGATIVE Final   Staphylococcus aureus 11/04/2021 NEGATIVE  NEGATIVE Final   Comment: (NOTE) The Xpert SA Assay (FDA approved for NASAL specimens in patients 25 years of age and older), is one component of a comprehensive surveillance program. It is not intended to diagnose infection nor to guide or monitor treatment. Performed at [facility], [place] [address] [place], [place] [address]      X-Rays:DG Pelvis Portable  Result Date: 11/12/2021 CLINICAL DATA:  Postop hip replacement. EXAM: PORTABLE PELVIS 1-2 VIEWS COMPARISON:  Intraoperative radiographs 11/12/2021. FINDINGS: 0952 hours. AP view of the pelvis excludes the iliac crests. Patient is status post right total hip arthroplasty. The hardware appears well positioned. No evidence of acute fracture or dislocation on single AP view. Mild degenerative changes are present at the left hip. There is gas within the soft tissues of the proximal right thigh. IMPRESSION: No demonstrated complication on single AP view following right total hip arthroplasty. Electronically Signed   By: Richardean Sale M.D.   On: 11/12/2021 10:27   DG C-Arm 1-60 Min-No Report  Result Date: 11/12/2021 Fluoroscopy was utilized by the requesting physician.  No radiographic interpretation.   DG HIP UNILAT WITH PELVIS 1V RIGHT  Result Date: 11/12/2021 CLINICAL DATA:  Right hip replacement EXAM: DG HIP (WITH OR WITHOUT PELVIS) 1V RIGHT COMPARISON:  None. FINDINGS: Changes of right hip replacement. No hardware bony complicating feature. Normal AP alignment. IMPRESSION: Right hip replacement.  No visible complicating feature. Electronically Signed   By: Rolm Baptise M.D.   On: 11/12/2021 12:06    EKG: Orders placed or performed in visit on 09/22/21   EKG 12-Lead     Hospital Course: Laura Galvan is a 76 y.o. who was  admitted to [facility]. They were brought to the operating room on 11/12/2021 and underwent Procedure(s): [place].  Patient tolerated the procedure well and was later transferred to the recovery room and then to  the orthopaedic floor for postoperative care. They were given PO and IV analgesics for pain control following their surgery. They were given 24 hours of postoperative antibiotics of  Anti-infectives (From admission, onward)    Start     Dose/Rate Route Frequency Ordered Stop   11/12/21 1400  ceFAZolin (ANCEF) IVPB 2g/100 mL premix        2 g 200 mL/hr over 30 Minutes Intravenous Every 6 hours 11/12/21 1125 11/12/21 2102   11/12/21 0630  ceFAZolin (ANCEF) IVPB 2g/100 mL premix        2 g 200 mL/hr over 30 Minutes Intravenous On call to O.R. 11/12/21 6222 11/12/21 0854      and started on DVT prophylaxis in the form of Xarelto and TED hose.   PT and OT were ordered for total joint protocol. Discharge planning consulted to help with postop disposition and equipment needs.  Patient had an uneventful night on the evening of surgery. They started to get up OOB with therapy on 11/12/21. Pt was seen during rounds and was ready to go home pending progress with therapy. She worked with therapy on POD #1 and was meeting goals. Pt was discharged to home later that day in stable condition.  Diet: Regular diet Activity: WBAT Follow-up: in two weeks Disposition: Home Discharged Condition: good   Discharge Instructions     Call MD / Call 911   Complete by: As directed    If you experience chest pain or shortness of breath, CALL 911 and be transported to the hospital emergency room.  If you develope a fever above 101 F, pus (white drainage) or increased drainage or redness at the wound, or calf pain, call your surgeon's office.   Change dressing   Complete by: As directed    You have an adhesive waterproof bandage over the incision. Leave this in place  until your first follow-up appointment. Once you remove this you will not need to place another bandage.   Constipation Prevention   Complete by: As directed    Drink plenty of fluids.  Prune juice may be helpful.  You may use a stool softener, such as Colace (over the counter) 100 mg twice a day.  Use MiraLax (over the counter) for constipation as needed.   Diet - low sodium heart healthy   Complete by: As directed    Do not sit on low chairs, stoools or toilet seats, as it may be difficult to get up from low surfaces   Complete by: As directed    Driving restrictions   Complete by: As directed    No driving for two weeks   Post-operative opioid taper instructions:   Complete by: As directed    POST-OPERATIVE OPIOID TAPER INSTRUCTIONS: It is important to wean off of your opioid medication as soon as possible. If you do not need pain medication after your surgery it is ok to stop day one. Opioids include: Codeine, Hydrocodone(Norco, Vicodin), Oxycodone(Percocet, oxycontin) and hydromorphone amongst others.  Long term and even short term use of opiods can cause: Increased pain response Dependence Constipation Depression Respiratory depression And more.  Withdrawal symptoms can include Flu like symptoms Nausea, vomiting And more Techniques to manage these symptoms Hydrate well Eat regular healthy meals Stay active Use relaxation techniques(deep breathing, meditating, yoga) Do Not substitute Alcohol to help with tapering If you have been on opioids for less than two weeks and do not have pain than it is ok to stop all together.  Plan  to wean off of opioids This plan should start within one week post op of your joint replacement. Maintain the same interval or time between taking each dose and first decrease the dose.  Cut the total daily intake of opioids by one tablet each day Next start to increase the time between doses. The last dose that should be eliminated is the evening  dose.      TED hose   Complete by: As directed    Use stockings (TED hose) for three weeks on both leg(s).  You may remove them at night for sleeping.   Weight bearing as tolerated   Complete by: As directed       Allergies as of 11/13/2021       Reactions   Desmopressin Other (See Comments)   HYPONATREMIA   Metoprolol Other (See Comments)   fatique   Tramadol Other (See Comments)   Pt had hallucination while on it   Other Other (See Comments)   Runny nose, post nasal drip, coughing Other Reaction: in childhood Roaches, dust, grass/pollen, other environmental triggers: causes runny nose, post nasal drip, coughing   Sulfa Antibiotics Other (See Comments)   UNSPECIFIED REACTION FROM CHILDHOOD        Medication List     STOP taking these medications    acetaminophen 325 MG tablet Commonly known as: TYLENOL   CALTRATE 600+D PO   multivitamin with minerals Tabs tablet       TAKE these medications    aspirin 81 MG EC tablet Take 1 tablet (81 mg total) by mouth daily. What changed: when to take this   atorvastatin 10 MG tablet Commonly known as: LIPITOR Take 10 mg by mouth daily at 6 PM.   EPINEPHrine 0.3 mg/0.3 mL Soaj injection Commonly known as: EPI-PEN Inject 0.3 mg into the muscle as needed for anaphylaxis.   ezetimibe 10 MG tablet Commonly known as: ZETIA Take 1 tablet (10 mg total) by mouth daily. Keep upcoming appointment for future refills   HYDROcodone-acetaminophen 5-325 MG tablet Commonly known as: NORCO/VICODIN Take 1-2 tablets by mouth every 6 (six) hours as needed for severe pain. Notes to patient: Last dose given 08:44am   methocarbamol 500 MG tablet Commonly known as: ROBAXIN Take 1 tablet (500 mg total) by mouth every 6 (six) hours as needed for muscle spasms. Notes to patient: Last dose given 12/22 08:44am   rivaroxaban 10 MG Tabs tablet Commonly known as: XARELTO Take 1 tablet (10 mg total) by mouth daily with breakfast. Then  take one 81 mg aspirin once a day for three weeks. Then discontinue aspirin.   Systane Ultra PF 0.4-0.3 % Soln Generic drug: Polyethyl Glyc-Propyl Glyc PF Place 1 drop into both eyes daily as needed (dry eyes). Notes to patient: Resume home regimen               Discharge Care Instructions  (From admission, onward)           Start     Ordered   11/13/21 0000  Weight bearing as tolerated        11/13/21 0724   11/13/21 0000  Change dressing       Comments: You have an adhesive waterproof bandage over the incision. Leave this in place until your first follow-up appointment. Once you remove this you will not need to place another bandage.   11/13/21 0724            Follow-up Information     Aluisio,  Pilar Plate, MD. Schedule an appointment as soon as possible for a visit in 2 week(s).   Specialty: Orthopedic Surgery Contact information: [address] [place] [address] [phone]                 Signed: Fenton Foy, MBA, PA-C Orthopedic Surgery 11/26/2021, 12:18 AM

## 2021-12-22 ENCOUNTER — Other Ambulatory Visit: Payer: Self-pay | Admitting: Cardiovascular Disease

## 2023-02-22 ENCOUNTER — Telehealth: Payer: Self-pay | Admitting: Cardiovascular Disease

## 2023-02-22 DIAGNOSIS — I340 Nonrheumatic mitral (valve) insufficiency: Secondary | ICD-10-CM

## 2023-02-22 DIAGNOSIS — Z9889 Other specified postprocedural states: Secondary | ICD-10-CM

## 2023-02-22 NOTE — Telephone Encounter (Signed)
Patient called and stated that she would like to have an echo done on same day as appt on 03/11/23. Said to consult with Dr. Aundra Dubin with any questions. Please call patient back

## 2023-02-22 NOTE — Telephone Encounter (Signed)
Returned call to patient to inform her that I am contacting Dr Burt Knack for ECHO order, then will send to Edmon Crape to see when we can get it done. No openings in our office currently for 03/11/23.  Spoke with Dr Burt Knack via phone who gave verbal order for ECHO. Order entered at this time and routed to [place].

## 2023-02-26 ENCOUNTER — Ambulatory Visit (HOSPITAL_COMMUNITY): Payer: Medicare Other | Attending: Cardiovascular Disease

## 2023-02-26 DIAGNOSIS — I340 Nonrheumatic mitral (valve) insufficiency: Secondary | ICD-10-CM | POA: Diagnosis present

## 2023-02-26 DIAGNOSIS — Z9889 Other specified postprocedural states: Secondary | ICD-10-CM | POA: Diagnosis present

## 2023-02-27 LAB — ECHOCARDIOGRAM COMPLETE
AV Vena cont: 0.1 cm
Area-P 1/2: 3.6 cm2
MV VTI: 1.56 cm2
S' Lateral: 2.6 cm

## 2023-03-11 ENCOUNTER — Encounter: Payer: Self-pay | Admitting: Cardiovascular Disease

## 2023-03-11 ENCOUNTER — Ambulatory Visit: Payer: Medicare Other | Attending: Cardiovascular Disease | Admitting: Cardiovascular Disease

## 2023-03-11 VITALS — BP 122/68 | HR 75 | Ht 62.5 in | Wt 96.0 lb

## 2023-03-11 DIAGNOSIS — Z959 Presence of cardiac and vascular implant and graft, unspecified: Secondary | ICD-10-CM

## 2023-03-11 DIAGNOSIS — Z9889 Other specified postprocedural states: Secondary | ICD-10-CM | POA: Insufficient documentation

## 2023-03-11 DIAGNOSIS — I340 Nonrheumatic mitral (valve) insufficiency: Secondary | ICD-10-CM | POA: Insufficient documentation

## 2023-03-11 NOTE — Patient Instructions (Signed)
Medication Instructions:  Your physician recommends that you continue on your current medications as directed. Please refer to the Current Medication list given to you today.  *If you need a refill on your cardiac medications before your next appointment, please call your pharmacy*   Lab Work: NONE If you have labs (blood work) drawn today and your tests are completely normal, you will receive your results only by: MyChart Message (if you have MyChart) OR A paper copy in the mail If you have any lab test that is abnormal or we need to change your treatment, we will call you to review the results.   Testing/Procedures: ECHO (to be done in 1 year, prior to appt) Your physician has requested that you have an echocardiogram. Echocardiography is a painless test that uses sound waves to create images of your heart. It provides your doctor with information about the size and shape of your heart and how well your heart's chambers and valves are working. This procedure takes approximately one hour. There are no restrictions for this procedure. Please do NOT wear cologne, perfume, aftershave, or lotions (deodorant is allowed). Please arrive 15 minutes prior to your appointment time.  Follow-Up: At [facility], you and your health needs are our priority.  As part of our continuing mission to provide you with exceptional heart care, we have created designated Provider Care Teams.  These Care Teams include your primary Cardiologist (physician) and Advanced Practice Providers (APPs -  Physician Assistants and Nurse Practitioners) who all work together to provide you with the care you need, when you need it.  Your next appointment:   1 year(s)  Provider:   Tonny Bollman, MD

## 2023-03-11 NOTE — Progress Notes (Signed)
Cardiology Office Note:    Date:  03/11/2023   ID:  Laura Galvan, DOB Aug 02, 1946, MRN [id number]  PCP:  Layne Benton, MD   [facility] Providers Cardiologist:  Tonny Bollman, MD     Referring MD: Layne Benton, MD   Chief Complaint  Patient presents with   Mitral Valve Prolapse    History of Present Illness:    Laura Galvan is a 77 y.o. female with a history of mitral valve prolapse and severe symptomatic mitral regurgitation, presenting for follow-up evaluation.  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  From a cardiac perspective, she is doing fine.  She denies chest pain, chest pressure, or shortness of breath.  No heart palpitations, orthopnea, or PND.  She has some mild exercise intolerance related mostly to joint pain with mild fatigue.  No other symptoms reported.  Past Medical History:  Diagnosis Date   Breast cancer 2012   Right   Bronchiectasis 09/13/2015   October 2016 pulmonary function testing ratio 80%, FEV1 1.93 L (88% predicted), FVC 2.43 L (43% predicted), total lung capacity 4.33 L (88% predicted), DLCO 20.52 (89% predicted). 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   Complex valvuloplasty including quadrangular resection of posterior leaflet x2, sliding leaflet plasty, artificial Gore-tex neochord placement x12 and 34 mm Sorin Memo 3D ring annuloplasty via right mini thoracotomy approach   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     Past Surgical History:  Procedure Laterality Date    APPENDECTOMY  11/23/1958   CARDIAC CATHETERIZATION N/A 07/16/2016   Procedure: Right/Left Heart Cath and Coronary Angiography;  Surgeon: Tonny Bollman, MD;  Location: [facility] INVASIVE CV LAB;  Service: Cardiovascular;  Laterality: N/A;   MASTECTOMY Left    MITRAL VALVE REPAIR Right 07/29/2016   Procedure: MINIMALLY INVASIVE MITRAL VALVE REPAIR (MVR) USING 34 MEMO  ANNULOPLASTY RING.;  Surgeon: Purcell Nails, MD;  Location: [facility] OR;  Service: Open Heart Surgery;  Laterality: Right;   PERIPHERAL VASCULAR CATHETERIZATION N/A 07/16/2016   Procedure: Abdominal Aortogram;  Surgeon: Tonny Bollman, MD;  Location[facility] INVASIVE CV LAB;  Service: Cardiovascular;  Laterality: N/A;   ROTATOR CUFF REPAIR Right 11/23/2009   low K after surgery pt had a seizure   TEE WITHOUT CARDIOVERSION N/A 07/29/2016   Procedure: TRANSESOPHAGEAL ECHOCARDIOGRAM (TEE);  Surgeon: Purcell Nails, MD;  Location: [facility] OR;  Service: Open Heart Surgery;  Laterality: N/A;   TOTAL HIP ARTHROPLASTY Right 11/12/2021   Procedure: TOTAL HIP ARTHROPLASTY ANTERIOR APPROACH;  Surgeon: Ollen Gross, MD;  Location: [facility] ORS;  Service: Orthopedics;  Laterality: Right;    Current Medications: Current Meds  Medication Sig   aspirin EC 81 MG EC tablet Take 1 tablet (81 mg total) by mouth daily. (Patient taking differently: Take 81 mg by mouth at bedtime.)   atorvastatin (LIPITOR) 10 MG tablet Take 10 mg by mouth daily at 6 PM.    EPINEPHrine 0.3 mg/0.3 mL IJ SOAJ injection Inject 0.3 mg into the muscle as needed for anaphylaxis.  ezetimibe (ZETIA) 10 MG tablet TAKE 1 TABLET (10 MG TOTAL) BY MOUTH DAILY. KEEP UPCOMING APPOINTMENT FOR FUTURE REFILLS   letrozole (FEMARA) 2.5 MG tablet Take by mouth.   Olopatadine HCl 0.2 % SOLN ONE DROP IN EACH EYE DAILY OPHTHALMIC 30 DAYS AS NEEDED   Polyethyl Glyc-Propyl Glyc PF (SYSTANE ULTRA PF) 0.4-0.3 % SOLN Place 1 drop into both eyes daily as needed (dry eyes).     Allergies:   Aspirin, Desmopressin,  Tramadol, Metoprolol, Other, and Sulfa antibiotics   Social History   Socioeconomic History   Marital status: Married    Spouse name: Not on file   Number of children: Not on file   Years of education: Not on file   Highest education level: Not on file  Occupational History   Not on file  Tobacco Use   Smoking status: Never   Smokeless tobacco: Never  Vaping Use   Vaping Use: Never used  Substance and Sexual Activity   Alcohol use: Yes   Drug use: No   Sexual activity: Not on file  Other Topics Concern   Not on file  Social History Narrative   Not on file   Social Determinants of Health   Financial Resource Strain: Not on file  Food Insecurity: Not on file  Transportation Needs: Not on file  Physical Activity: Not on file  Stress: Not on file  Social Connections: Not on file     Family History: The patient's family history includes Clotting disorder in her unknown relative; Heart disease in her father; Lung cancer in her father and mother.  ROS:   Please see the history of present illness.    All other systems reviewed and are negative.  EKGs/Labs/Other Studies Reviewed:    The following studies were reviewed today: Cardiac Studies & Procedures   CARDIAC CATHETERIZATION  CARDIAC CATHETERIZATION 07/16/2016  Narrative  Prox LAD to Mid LAD lesion, 30 %stenosed.  LV end diastolic pressure is low.  LV end diastolic pressure is normal.  1. Mild diffuse nonobstructive proximal LAD stenosis 2. Widely patent left main, left circumflex, and RCA 3. Normal intracardiac hemodynamics 4. Patent aortoiliac vessels and patent renal arteries bilaterally  Continue evaluation for minimally invasive mitral valve surgery. Medical therapy for nonobstructive CAD.  Findings Coronary Findings Diagnostic  Dominance: Right  Left Anterior Descending The lesion is moderately calcified. The stenosis was measured by a visual reading. mild diffuse irregular nonobstructive  proximal LAD stenosis  Left Circumflex Vessel was injected. Vessel is normal in caliber. Vessel is angiographically normal.  Right Coronary Artery The vessel exhibits minimal luminal irregularities. The RCA is calcified without stenosis. This is a dominant vessel.  Right Posterior Descending Artery Vessel is normal in size. Vessel is angiographically normal. The PDA is widely patent  Intervention  No interventions have been documented.     ECHOCARDIOGRAM  ECHOCARDIOGRAM COMPLETE 02/27/2023  Narrative ECHOCARDIOGRAM REPORT    Patient Name:   AURILLA COULIBALY   Date of Exam: 02/26/2023 Medical Rec #:  [id number]     Height:       62.5 in Accession #:    [id number]    Weight:       100.1 lb Date of Birth:  1946/05/19     BSA:          1.433 m Patient Age:    76 years      BP:           127/70 mmHg Patient Gender: F  HR:           75 bpm. Exam Location:  [address]  Procedure: 2D Echo, Cardiac Doppler and Color Doppler  Indications:    S/P mitral valve repair [[id number]]  History:        Patient has prior history of Echocardiogram examinations, most recent 06/12/2020. Mitral Valve Prolapse; Risk Factors:Hypertension.  Mitral Valve: 34 prosthetic annuloplasty ring valve is present in the mitral position. Procedure Date: 07/29/16.  Sonographer:    Eulah Pont RDCS Referring Phys: [phone] Lliam Hoh  IMPRESSIONS   1. Left ventricular ejection fraction, by estimation, is 60 to 65%. The left ventricle has normal function. The left ventricle has no regional wall motion abnormalities. Left ventricular diastolic parameters are indeterminate. 2. Right ventricular systolic function is normal. The right ventricular size is normal. 3. MV repair with annuloplasty ring. The mitral valve has been repaired/replaced. No evidence of mitral valve regurgitation. No evidence of mitral stenosis. There is a 34 prosthetic annuloplasty ring present in the mitral position. Procedure  Date: 07/29/16. Echo findings are consistent with normal structure and function of the mitral valve prosthesis. 4. The aortic valve is normal in structure. Aortic valve regurgitation is trivial. No aortic stenosis is present. 5. The inferior vena cava is normal in size with greater than 50% respiratory variability, suggesting right atrial pressure of 3 mmHg.  FINDINGS Left Ventricle: Left ventricular ejection fraction, by estimation, is 60 to 65%. The left ventricle has normal function. The left ventricle has no regional wall motion abnormalities. The left ventricular internal cavity size was normal in size. There is no left ventricular hypertrophy. Left ventricular diastolic parameters are indeterminate.  Right Ventricle: The right ventricular size is normal. No increase in right ventricular wall thickness. Right ventricular systolic function is normal.  Left Atrium: Left atrial size was normal in size.  Right Atrium: Right atrial size was normal in size.  Pericardium: There is no evidence of pericardial effusion.  Mitral Valve: MV repair with annuloplasty ring. The mitral valve has been repaired/replaced. No evidence of mitral valve regurgitation. There is a 34 prosthetic annuloplasty ring present in the mitral position. Procedure Date: 07/29/16. Echo findings are consistent with normal structure and function of the mitral valve prosthesis. No evidence of mitral valve stenosis. MV peak gradient, 7.5 mmHg. The mean mitral valve gradient is 3.0 mmHg.  Tricuspid Valve: The tricuspid valve is normal in structure. Tricuspid valve regurgitation is not demonstrated. No evidence of tricuspid stenosis.  Aortic Valve: The aortic valve is normal in structure. Aortic valve regurgitation is trivial. No aortic stenosis is present.  Pulmonic Valve: The pulmonic valve was normal in structure. Pulmonic valve regurgitation is not visualized. No evidence of pulmonic stenosis.  Aorta: The aortic root is normal  in size and structure.  Venous: The inferior vena cava is normal in size with greater than 50% respiratory variability, suggesting right atrial pressure of 3 mmHg.  IAS/Shunts: No atrial level shunt detected by color flow Doppler.   LEFT VENTRICLE PLAX 2D LVIDd:         3.80 cm LVIDs:         2.60 cm LV PW:         0.80 cm LV IVS:        0.80 cm LVOT diam:     2.00 cm LV SV:         59 LV SV Index:   41 LVOT Area:     3.14 cm   RIGHT VENTRICLE RV  S prime:     13.10 cm/s TAPSE (M-mode): 1.6 cm  LEFT ATRIUM             Index        RIGHT ATRIUM          Index LA diam:        3.30 cm 2.30 cm/m   RA Area:     8.90 cm LA Vol (A2C):   27.7 ml 19.33 ml/m  RA Volume:   19.90 ml 13.89 ml/m LA Vol (A4C):   17.4 ml 12.14 ml/m LA Biplane Vol: 22.1 ml 15.42 ml/m AORTIC VALVE LVOT Vmax:         94.90 cm/s LVOT Vmean:        67.600 cm/s LVOT VTI:          0.189 m AR Vena Contracta: 0.10 cm  AORTA Ao Root diam: 2.90 cm Ao Asc diam:  3.40 cm  MITRAL VALVE MV Area (PHT): 3.60 cm     SHUNTS MV Area VTI:   1.56 cm     Systemic VTI:  0.19 m MV Peak grad:  7.5 mmHg     Systemic Diam: 2.00 cm MV Mean grad:  3.0 mmHg MV Vmax:       1.37 m/s MV Vmean:      77.2 cm/s MV Decel Time: 211 msec MV E velocity: 127.00 cm/s MV A velocity: 96.90 cm/s MV E/A ratio:  1.31  Donato Schultz MD Electronically signed by Donato Schultz MD Signature Date/Time: 02/27/2023/5:48:09 AM    Final   TEE  ECHO TEE 07/31/2016  Narrative *[place]* *[facility]* [address] [place], [place] [address] [phone]  ------------------------------------------------------------------- Intraoperative Transesophageal Echocardiography  Patient:    Dawnita, Molner MR #:       [id number] Study Date: 07/29/2016 Gender:     F Age:        69 Height: Weight: BSA: Pt. Status: Room:       4O96E  ADMITTING  Tressie Stalker, M.D. ATTENDING  Tressie Stalker, M.D. ORDERING   Tressie Stalker,  M.D. REFERRING  Tressie Stalker, M.D.  cc:  ------------------------------------------------------------------- LV EF: 60% -   65%  ------------------------------------------------------------------- Study Conclusions  - Left ventricle: The cavity size was normal. Wall thickness was normal. The estimated ejection fraction was in the range of 60% to 65%. There was no dynamic obstruction. Wall motion was normal; there were no regional wall motion abnormalities. - Aortic valve: There was trivial regurgitation directed centrally in the LVOT. - Mitral valve: Thickening. Prolapse. Although there was no diagnostic evidence for systolic anterior motion, this possibility cannot be completely excluded on the basis of this study. There was contractile dysfunction of the papillary muscles. No evidence of vegetation. There was severe regurgitation directed eccentrically and anteriorly. - Left atrium: The atrium was mildly dilated. - Atrial septum: No defect or patent foramen ovale was identified.  Intraoperative transesophageal echocardiography.  Birthdate: Patient birthdate: [birth year].  Age:  Patient is 77 yr old.  Sex: Gender: female.  Study date:  Study date: 07/29/2016. Study time: 09:21 AM.  -------------------------------------------------------------------  ------------------------------------------------------------------- Left ventricle:  Concern for SAM noted with elevated HR. Well visualized. The cavity size was normal. Wall thickness was normal. There was no hypertrophy. The estimated ejection fraction was in the range of 60% to 65%. There was no dynamic obstruction. Wall motion was normal; there were no regional wall motion abnormalities.  ------------------------------------------------------------------- Aortic valve:  Well visualized.  Normal-sized, noncalcified annulus. Structurally normal  valve. The valve appears to be grossly normal. Trileaflet; normal thickness  leaflets. Cusp separation was normal. Mobility was not restricted. No echocardiographic evidence for prolapse.  Doppler:  Transvalvular velocity was within the normal range. There was trivial regurgitation directed centrally in the LVOT.  ------------------------------------------------------------------- Aorta:   The arch was left-sided. Brachiocephalic branching was normal.   The aorta was well visualized, not dilated, non-tortuous, mildly calcified, and mildly diseased.  ------------------------------------------------------------------- Mitral valve:  P2 of posterior leaflet has considerable redundant tissue, flail segment appreciated at the P2-P3 junction. Severe eccentric MR with anterior jet. Normal velocities. Well visualized.  Severely thickened leaflets posterior. Thickening.  Prolapse. Although there was no diagnostic evidence for systolic anterior motion, this possibility cannot be completely excluded on the basis of this study. There was contractile dysfunction of the papillary muscles.  No evidence of vegetation. Doppler:  Transvalvular velocity was within the normal range. There was no evidence for stenosis. There was severe regurgitation directed eccentrically and anteriorly.  ------------------------------------------------------------------- Left atrium:  Well visualized. The atrium was mildly dilated. The atrial free wall was not hypertrophied.  ------------------------------------------------------------------- Atrial septum:  Well visualized. No defect or patent foramen ovale was identified.  ------------------------------------------------------------------- Right ventricle:  Well visualized. The cavity size was normal. Wall thickness was normal. There was no evidence of concentric hypertrophy. Systolic function was normal.  ------------------------------------------------------------------- Pulmonic valve:   Well visualized.  Normal-sized  annulus. Structurally normal valve. The valve appears to be grossly normal. Cusp separation was normal.  ------------------------------------------------------------------- Tricuspid valve:  Minimally dilated tricuspid annulus. Well visualized.  Structurally normal valve. The valve appears to be grossly normal. Normal thickness leaflets. Leaflet separation was normal.  Doppler:  Transvalvular velocity was within the normal range. There was no evidence for stenosis. There was trivial regurgitation directed centrally.  ------------------------------------------------------------------- Right atrium:  Well visualized. The atrium was normal in size.  ------------------------------------------------------------------- Pericardium:  The pericardium was normal in appearance.  ------------------------------------------------------------------- Post bypass:  - LV size was normal and unchanged from the prior stage. - The estimated LV ejection fraction was INVALID INPUT: >60%. - Normal wall motion; no LV regional wall motion abnormalities. - No evidence for new LV regional wall motion abnormalities.  Mitral valve:  No stenosis.  Trivial regurgitation, with a centrally directed regurgitant jet; severely decreased from the prior stage.  ------------------------------------------------------------------- Post procedure conclusions Ascending Aorta:  - The aorta was well visualized, not dilated, non-tortuous, mildly calcified, and mildly diseased.  ------------------------------------------------------------------- Prepared and Electronically Authenticated by  Shona Simpson, MD 2017-09-08T19:32:54             EKG:  EKG is ordered today.  The ekg ordered today demonstrates NSR 75 bpm, possible age-indeterminate septal infarct, otherwise normal  Recent Labs: No results found for requested labs within last 365 days.  Recent Lipid Panel No results found for: "CHOL", "TRIG", "HDL",  "CHOLHDL", "VLDL", "LDLCALC", "LDLDIRECT"   Risk Assessment/Calculations:                Physical Exam:    VS:  BP 122/68   Pulse 75   Ht 5' 2.5" (1.588 m)   Wt 96 lb (43.5 kg)   SpO2 94%   BMI 17.28 kg/m     Wt Readings from Last 3 Encounters:  03/11/23 96 lb (43.5 kg)  11/12/21 100 lb 1.4 oz (45.4 kg)  11/04/21 100 lb (45.4 kg)     GEN:  Well nourished, well developed in no acute distress HEENT: Normal NECK: No JVD; No carotid bruits  LYMPHATICS: No lymphadenopathy CARDIAC: RRR, no murmurs, rubs, gallops RESPIRATORY:  Clear to auscultation without rales, wheezing or rhonchi  ABDOMEN: Soft, non-tender, non-distended MUSCULOSKELETAL:  No edema; No deformity  SKIN: Warm and dry NEUROLOGIC:  Alert and oriented x 3 PSYCHIATRIC:  Normal affect   ASSESSMENT:    1. H/O mitral valve repair   2. Mitral valve insufficiency, unspecified etiology    PLAN:    In order of problems listed above:  Patient's echo is reviewed and shows normal LV systolic function with normal LVEF.  The mitral valve repair site is completely intact with no residual mitral regurgitation and a mean transmitral gradient of 3 mmHg.  Continue annual follow-up.  SBE prophylaxis as indicated.           Medication Adjustments/Labs and Tests Ordered: Current medicines are reviewed at length with the patient today.  Concerns regarding medicines are outlined above.  Orders Placed This Encounter  Procedures   ECHOCARDIOGRAM COMPLETE   No orders of the defined types were placed in this encounter.   Patient Instructions  Medication Instructions:  Your physician recommends that you continue on your current medications as directed. Please refer to the Current Medication list given to you today.  *If you need a refill on your cardiac medications before your next appointment, please call your pharmacy*   Lab Work: NONE If you have labs (blood work) drawn today and your tests are completely normal,  you will receive your results only by: MyChart Message (if you have MyChart) OR A paper copy in the mail If you have any lab test that is abnormal or we need to change your treatment, we will call you to review the results.   Testing/Procedures: ECHO (to be done in 1 year, prior to appt) Your physician has requested that you have an echocardiogram. Echocardiography is a painless test that uses sound waves to create images of your heart. It provides your doctor with information about the size and shape of your heart and how well your heart's chambers and valves are working. This procedure takes approximately one hour. There are no restrictions for this procedure. Please do NOT wear cologne, perfume, aftershave, or lotions (deodorant is allowed). Please arrive 15 minutes prior to your appointment time.  Follow-Up: At [facility], you and your health needs are our priority.  As part of our continuing mission to provide you with exceptional heart care, we have created designated Provider Care Teams.  These Care Teams include your primary Cardiologist (physician) and Advanced Practice Providers (APPs -  Physician Assistants and Nurse Practitioners) who all work together to provide you with the care you need, when you need it.  Your next appointment:   1 year(s)  Provider:   Tonny Bollman, MD       Signed, Tonny Bollman, MD  03/11/2023 4:58 PM    [facility]

## 2023-03-15 ENCOUNTER — Ambulatory Visit (HOSPITAL_COMMUNITY): Payer: Medicare Other

## 2023-08-26 ENCOUNTER — Other Ambulatory Visit (HOSPITAL_COMMUNITY): Payer: Self-pay | Admitting: Cardiology

## 2023-08-26 ENCOUNTER — Ambulatory Visit (HOSPITAL_COMMUNITY)
Admission: RE | Admit: 2023-08-26 | Discharge: 2023-08-26 | Disposition: A | Discharge status: Home / Self Care | Payer: Medicare Other | Source: Ambulatory Visit | Attending: Cardiology | Admitting: Cardiology

## 2023-08-26 ENCOUNTER — Telehealth: Payer: Self-pay

## 2023-08-26 ENCOUNTER — Ambulatory Visit: Payer: Medicare Other | Attending: Family | Admitting: Family

## 2023-08-26 ENCOUNTER — Inpatient Hospital Stay (HOSPITAL_COMMUNITY)
Admission: RE | Admit: 2023-08-26 | Discharge: 2023-08-26 | Disposition: A | Discharge status: Home / Self Care | Payer: Medicare Other | Source: Ambulatory Visit | Attending: Cardiology | Admitting: Cardiology

## 2023-08-26 DIAGNOSIS — I471 Supraventricular tachycardia, unspecified: Secondary | ICD-10-CM

## 2023-08-26 DIAGNOSIS — I472 Ventricular tachycardia, unspecified: Secondary | ICD-10-CM

## 2023-08-26 DIAGNOSIS — R9431 Abnormal electrocardiogram [ECG] [EKG]: Secondary | ICD-10-CM

## 2023-08-26 DIAGNOSIS — Z9889 Other specified postprocedural states: Secondary | ICD-10-CM

## 2023-08-26 DIAGNOSIS — I4729 Other ventricular tachycardia: Secondary | ICD-10-CM

## 2023-08-26 NOTE — Telephone Encounter (Signed)
Called and spoke with patient. Scheduled for ECHO to be done 3pm on 09/10/23.

## 2023-08-26 NOTE — Progress Notes (Signed)
Zio patch placed onto patient.  All instructions and information reviewed with patient, they verbalize understanding with no questions. 

## 2023-08-26 NOTE — Patient Instructions (Signed)
Your provider has recommended that  you wear a Zio Patch for 14 days.  This monitor will record your heart rhythm for our review.  IF you have any symptoms while wearing the monitor please press the button.  If you have any issues with the patch or you notice a red or orange light on it please call the company at [phone].  Once you remove the patch please mail it back to the company as soon as possible so we can get the results.

## 2023-08-26 NOTE — Progress Notes (Signed)
Zio monitor applied

## 2023-08-26 NOTE — Telephone Encounter (Signed)
-----   Message from Tonny Bollman sent at 08/26/2023  1:05 PM EDT ----- Please order an echo on Laura Galvan. Indication: nonsustained VT

## 2023-09-10 ENCOUNTER — Ambulatory Visit (HOSPITAL_COMMUNITY): Payer: Medicare Other | Attending: Internal Medicine

## 2023-09-10 DIAGNOSIS — I4729 Other ventricular tachycardia: Secondary | ICD-10-CM

## 2023-09-10 DIAGNOSIS — R9431 Abnormal electrocardiogram [ECG] [EKG]: Secondary | ICD-10-CM

## 2023-09-10 LAB — ECHOCARDIOGRAM COMPLETE
Area-P 1/2: 3.61 cm2
S' Lateral: 2 cm

## 2023-09-20 NOTE — Addendum Note (Signed)
Encounter addended by: Crissie Figures, RN on: 09/20/2023 1:50 PM  Actions taken: Imaging Exam ended

## 2024-02-01 ENCOUNTER — Ambulatory Visit (HOSPITAL_COMMUNITY): Payer: Medicare Other

## 2024-08-14 ENCOUNTER — Encounter: Payer: Self-pay | Admitting: *Deleted

## 2024-08-15 ENCOUNTER — Ambulatory Visit: Attending: Cardiovascular Disease | Admitting: Cardiovascular Disease

## 2024-08-15 ENCOUNTER — Encounter: Payer: Self-pay | Admitting: Cardiovascular Disease

## 2024-08-15 VITALS — BP 140/70 | HR 64 | Ht 62.2 in | Wt 99.8 lb

## 2024-08-15 DIAGNOSIS — I471 Supraventricular tachycardia, unspecified: Secondary | ICD-10-CM | POA: Diagnosis not present

## 2024-08-15 DIAGNOSIS — Z9889 Other specified postprocedural states: Secondary | ICD-10-CM | POA: Diagnosis present

## 2024-08-15 DIAGNOSIS — I340 Nonrheumatic mitral (valve) insufficiency: Secondary | ICD-10-CM | POA: Diagnosis present

## 2024-08-15 NOTE — Assessment & Plan Note (Signed)
 Her exam remains normal.  I reviewed her echo from last year which shows normal function of her mitral valve with no mitral regurgitation or stenosis.  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  Continue SBE prophylaxis when indicated.

## 2024-08-15 NOTE — Patient Instructions (Signed)
 Medication Instructions:   Your physician recommends that you continue on your current medications as directed. Please refer to the Current Medication list given to you today.   *If you need a refill on your cardiac medications before your next appointment, please call your pharmacy*  Lab Work:  None ordered.  If you have labs (blood work) drawn today and your tests are completely normal, you will receive your results only by: MyChart Message (if you have MyChart) OR A paper copy in the mail If you have any lab test that is abnormal or we need to change your treatment, we will call you to review the results.  Testing/Procedures:  Your physician has requested that you have an echocardiogram. Echocardiography is a painless test that uses sound waves to create images of your heart. It provides your doctor with information about the size and shape of your heart and how well your heart's chambers and valves are working. This procedure takes approximately one hour. There are no restrictions for this procedure. Please do NOT wear cologne, perfume or lotions (deodorant is allowed). Please arrive 15 minutes prior to your appointment time. 1 year same day as appointment.   Please note: We ask at that you not bring children with you during ultrasound (echo/ vascular) testing. Due to room size and safety concerns, children are not allowed in the ultrasound rooms during exams. Our front office staff cannot provide observation of children in our lobby area while testing is being conducted. An adult accompanying a patient to their appointment will only be allowed in the ultrasound room at the discretion of the ultrasound technician under special circumstances. We apologize for any inconvenience.   Follow-Up: At [facility], you and your health needs are our priority.  As part of our continuing mission to provide you with exceptional heart care, our providers are all part of one team.  This team  includes your primary Cardiologist (physician) and Advanced Practice Providers or APPs (Physician Assistants and Nurse Practitioners) who all work together to provide you with the care you need, when you need it.  Your next appointment:   1 year(s)  Provider:   Ozell Fell, MD    We recommend signing up for the patient portal called MyChart.  Sign up information is provided on this After Visit Summary.  MyChart is used to connect with patients for Virtual Visits (Telemedicine).  Patients are able to view lab/test results, encounter notes, upcoming appointments, etc.  Non-urgent messages can be sent to your provider as well.   To learn more about what you can do with MyChart, go to [email or web address].   Other Instructions  Your physician wants you to follow-up in: 1 year.  You will receive a reminder letter in the mail two months in advance. If you don't receive a letter, please call our office to schedule the follow-up appointment.  [employer]: Website: [email or web address]   RECOMMEND YOU PURCHASE   [employer] FROM THE  [employer]  APP PLAY STORE.  THE APP IS FREE , BUT THE  EQUIPMENT HAS A COST. IT ALLOWS YOU TO OBTAIN A RECORDING OF YOUR HEART RATE AND RHYTHM BY PROVIDING A SHORT STRIP THAT YOU CAN SHARE WITH YOUR PROVIDER.

## 2024-08-15 NOTE — Assessment & Plan Note (Signed)
 Advised K[employer] mobile device.  I asked her to send me transmissions if she captures any arrhythmia during symptomatic episodes.  She is having fairly rare palpitations at present.  Continue current medications.

## 2024-08-15 NOTE — Assessment & Plan Note (Signed)
 S/P mitral valve repair with normal mitral valve function, asymptomatic.

## 2024-08-15 NOTE — Progress Notes (Signed)
 Cardiology Office Note:    Date:  08/15/2024   ID:  Laura Galvan, DOB 1945-12-30, MRN [id number]  PCP:  Odie Lynwood BIRCH, MD   [facility] Providers Cardiologist:  Ozell Fell, MD     Referring MD: Odie Lynwood BIRCH, MD   Chief Complaint  Patient presents with   Follow-up    S/P Mitral valve repair    History of Present Illness:    Laura Galvan is a 78 y.o. female with a hx of mitral valve prolapse and severe symptomatic mitral regurgitation, presenting for follow-up evaluation. She underwent minimally invasive mitral valve repair in 2017. Last echo 08/2023 showed normal LVEF 60-65% and intact mitral repair site with trivial MR and no MS.   The patient is here alone today.  She is doing well from a cardiac perspective.  She denies any chest pain or shortness of breath.  She is under stress at home, caring for her husband.  She has some occasional heart palpitations.  The patient wore a monitor last year that showed no evidence of atrial fibrillation or flutter.  Her longest run of SVT was 10 beats.   Current Medications: Current Meds  Medication Sig   aspirin  EC 81 MG EC tablet Take 1 tablet (81 mg total) by mouth daily.   atorvastatin  (LIPITOR) 10 MG tablet Take 10 mg by mouth daily at 6 PM.    denosumab (PROLIA) 60 MG/ML SOSY injection Inject 60 mg into the skin every 6 (six) months.   EPINEPHrine  0.3 mg/0.3 mL IJ SOAJ injection Inject 0.3 mg into the muscle as needed for anaphylaxis.   ezetimibe  (ZETIA ) 10 MG tablet TAKE 1 TABLET (10 MG TOTAL) BY MOUTH DAILY. KEEP UPCOMING APPOINTMENT FOR FUTURE REFILLS   Olopatadine HCl 0.2 % SOLN ONE DROP IN EACH EYE DAILY OPHTHALMIC 30 DAYS AS NEEDED   Polyethyl Glyc-Propyl Glyc PF (SYSTANE ULTRA PF) 0.4-0.3 % SOLN Place 1 drop into both eyes daily as needed (dry eyes).     Allergies:   Aspirin , Desmopressin, Tramadol , Metoprolol , Other, and Sulfa antibiotics   Past Medical History:  Diagnosis Date   Breast cancer (HCC) 2012    Right   Bronchiectasis (HCC) 09/13/2015   October 2016 pulmonary function testing ratio 80%, FEV1 1.93 L (88% predicted), FVC 2.43 L (43% predicted), total lung capacity 4.33 L (88% predicted), DLCO 20.52 (89% predicted). 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   Complex valvuloplasty including quadrangular resection of posterior leaflet x2, sliding leaflet plasty, artificial Gore-tex neochord placement x12 and 34 mm Sorin Memo 3D ring annuloplasty via right mini thoracotomy approach   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    Past Surgical History:  Procedure Laterality Date   APPENDECTOMY  11/23/1958   CARDIAC CATHETERIZATION N/A 07/16/2016   Procedure: Right/Left Heart Cath and Coronary Angiography;  Surgeon: Ozell Fell, MD;  Location: [facility] INVASIVE CV LAB;  Service: Cardiovascular;  Laterality: N/A;   MASTECTOMY Left    MITRAL VALVE REPAIR Right 07/29/2016   Procedure: MINIMALLY INVASIVE MITRAL VALVE REPAIR (MVR) USING 34  MEMO  ANNULOPLASTY RING.;  Surgeon: Sudie VEAR Laine, MD;  Location: [facility] OR;  Service: Open Heart Surgery;  Laterality: Right;   PERIPHERAL VASCULAR CATHETERIZATION N/A 07/16/2016   Procedure: Abdominal Aortogram;  Surgeon: Ozell Fell, MD;  Location: [facility] INVASIVE CV LAB;  Service: Cardiovascular;  Laterality: N/A;   ROTATOR CUFF REPAIR Right 11/23/2009   low K after surgery pt had a seizure   TEE WITHOUT CARDIOVERSION N/A 07/29/2016   Procedure: TRANSESOPHAGEAL ECHOCARDIOGRAM (TEE);  Surgeon: Sudie VEAR Laine, MD;  Location: [facility] OR;  Service: Open  Heart Surgery;  Laterality: N/A;   TOTAL HIP ARTHROPLASTY Right 11/12/2021   Procedure: TOTAL HIP ARTHROPLASTY ANTERIOR APPROACH;  Surgeon: Melodi Lerner, MD;  Location: [facility] ORS;  Service: Orthopedics;  Laterality: Right;    ROS:   Please see the history of present illness.    All other systems reviewed and are negative.  EKGs/Labs/Other Studies Reviewed:    The following studies were reviewed today: Cardiac Studies & Procedures   ______________________________________________________________________________________________ CARDIAC CATHETERIZATION  CARDIAC CATHETERIZATION 07/16/2016  Conclusion  Prox LAD to Mid LAD lesion, 30 %stenosed.  LV end diastolic pressure is low.  LV end diastolic pressure is normal.  1. Mild diffuse nonobstructive proximal LAD stenosis 2. Widely patent left main, left circumflex, and RCA 3. Normal intracardiac hemodynamics 4. Patent aortoiliac vessels and patent renal arteries bilaterally  Continue evaluation for minimally invasive mitral valve surgery. Medical therapy for nonobstructive CAD.  Findings Coronary Findings Diagnostic  Dominance: Right  Left Anterior Descending The lesion is moderately calcified. The stenosis was measured by a visual reading. mild diffuse irregular nonobstructive proximal LAD stenosis  Left Circumflex Vessel was injected. Vessel is normal in caliber. Vessel is angiographically normal.  Right Coronary Artery The vessel exhibits minimal luminal irregularities. The RCA is calcified without stenosis. This is a dominant vessel.  Right Posterior Descending Artery Vessel is normal in size. Vessel is angiographically normal. The PDA is widely patent  Intervention  No interventions have been documented.     ECHOCARDIOGRAM  ECHOCARDIOGRAM COMPLETE 09/10/2023  Narrative ECHOCARDIOGRAM REPORT    Patient Name:   Laura Galvan   Date of Exam: 09/10/2023 Medical Rec #:  [id number]     Height:       62.5  in Accession #:    [id number]    Weight:       96.0 lb Date of Birth:  11-Oct-1946     BSA:          1.408 m Patient Age:    36 years      BP:           122/68 mmHg Patient Gender: F             HR:           68 bpm. Exam Location:  [address]  Procedure: 2D Echo, Cardiac Doppler and Color Doppler  Indications:     I47.29 Non-Sustained Ventricular Tachycardia  History:         Patient has prior history of Echocardiogram examinations, most recent 02/26/2023. Abnormal ECG and Prior Cardiac Surgery, Mitral Valve Prolapse and Mitral Valve Disease, Arrythmias:NSVT, Signs/Symptoms:Murmur; Risk Factors:Dyslipidemia and Family History of Coronary Artery Disease. 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 History of Pneumothorax, Mitral Valve Repair (2017, 34mm Sorin Memo 3D ring Annuloplasty).  Mitral Valve: valve is present in the mitral position. Procedure Date: 07/29/16.  Sonographer:     Heather  Mcmahill RDCS Referring Phys:  LYNWOOD BIRCH MCLEOD Diagnosing Phys: Morene Brownie  IMPRESSIONS   1. Left ventricular ejection fraction, by estimation, is 60 to 65%. The left ventricle has normal function. The left ventricle has no regional wall motion abnormalities. Left ventricular diastolic parameters are consistent with Grade I diastolic dysfunction (impaired relaxation). 2. Right ventricular systolic function is normal. The right ventricular size is normal. 3. MV repair with annuloplasty ring. There is a 34mm prosthetic annuloplasty ring present in the mitral position. . The mitral valve has been repaired/replaced. Trivial mitral valve regurgitation. No evidence of mitral stenosis. There is a present in the mitral position. Procedure Date: 07/29/16. 4. The aortic valve is normal in structure. Aortic valve regurgitation is not visualized. Aortic valve sclerosis is present, with no evidence of aortic valve stenosis. 5. The  inferior vena cava is normal in size with greater than 50% respiratory variability, suggesting right atrial pressure of 3 mmHg.  FINDINGS Left Ventricle: Left ventricular ejection fraction, by estimation, is 60 to 65%. The left ventricle has normal function. The left ventricle has no regional wall motion abnormalities. The left ventricular internal cavity size was normal in size. There is no left ventricular hypertrophy. Left ventricular diastolic parameters are consistent with Grade I diastolic dysfunction (impaired relaxation).  Right Ventricle: The right ventricular size is normal. No increase in right ventricular wall thickness. Right ventricular systolic function is normal.  Left Atrium: Left atrial size was normal in size.  Right Atrium: Right atrial size was normal in size.  Pericardium: There is no evidence of pericardial effusion.  Mitral Valve: MV repair with annuloplasty ring. There is a 34mm prosthetic annuloplasty ring present in the mitral position. The mitral valve has been repaired/replaced. Trivial mitral valve regurgitation. There is a present in the mitral position. Procedure Date: 07/29/16. No evidence of mitral valve stenosis.  Tricuspid Valve: The tricuspid valve is normal in structure. Tricuspid valve regurgitation is trivial. No evidence of tricuspid stenosis.  Aortic Valve: The aortic valve is normal in structure. Aortic valve regurgitation is not visualized. Aortic valve sclerosis is present, with no evidence of aortic valve stenosis.  Pulmonic Valve: The pulmonic valve was normal in structure. Pulmonic valve regurgitation is not visualized. No evidence of pulmonic stenosis.  Aorta: The aortic root is normal in size and structure.  Venous: The inferior vena cava is normal in size with greater than 50% respiratory variability, suggesting right atrial pressure of 3 mmHg.  IAS/Shunts: No atrial level shunt detected by color flow Doppler.   LEFT VENTRICLE PLAX  2D LVIDd:         3.50 cm   Diastology LVIDs:         2.00 cm   LV e' medial:    6.96 cm/s LV PW:         0.90 cm   LV E/e' medial:  19.0 LV IVS:        1.30 cm   LV e' lateral:   8.16 cm/s LVOT diam:     2.10 cm   LV E/e' lateral: 16.2 LV SV:         85 LV SV Index:   61 LVOT Area:     3.46 cm   RIGHT VENTRICLE RV Basal diam:  3.50 cm RV S prime:     16.80 cm/s TAPSE (M-mode): 2.3 cm  LEFT ATRIUM             Index        RIGHT ATRIUM  Index LA diam:        2.40 cm 1.70 cm/m   RA Pressure: 3.00 mmHg LA Vol (A2C):   42.9 ml 30.47 ml/m  RA Area:     11.60 cm LA Vol (A4C):   37.7 ml 26.78 ml/m  RA Volume:   29.90 ml  21.24 ml/m LA Biplane Vol: 40.1 ml 28.48 ml/m AORTIC VALVE LVOT Vmax:   98.15 cm/s LVOT Vmean:  65.200 cm/s LVOT VTI:    0.246 m  AORTA Ao Root diam: 3.10 cm Ao Asc diam:  3.20 cm  MITRAL VALVE                TRICUSPID VALVE MV Area (PHT): cm          Estimated RAP:  3.00 mmHg MV Decel Time: 210 msec MV E velocity: 132.50 cm/s  SHUNTS MV A velocity: 128.50 cm/s  Systemic VTI:  0.25 m MV E/A ratio:  1.03         Systemic Diam: 2.10 cm  Morene Brownie Electronically signed by Morene Brownie Signature Date/Time: 09/10/2023/4:31:20 PM    Final (Updated)   TEE  ECHO TEE 07/29/2016  Narrative *[place]* *[facility]* [address] [place], [place] [address] [phone]  ------------------------------------------------------------------- Intraoperative Transesophageal Echocardiography  Patient:    Laura Galvan, Laura Galvan MR #:       [id number] Study Date: 07/29/2016 Gender:     F Age:        69 Height: Weight: BSA: Pt. Status: Room:       [id number]  ADMITTING  Sudie Laine, M.D. ATTENDING  Sudie Laine, M.D. ORDERING   Sudie Laine, M.D. REFERRING  Sudie Laine, M.D.  cc:  ------------------------------------------------------------------- LV EF: 60% -    65%  ------------------------------------------------------------------- Study Conclusions  - Left ventricle: The cavity size was normal. Wall thickness was normal. The estimated ejection fraction was in the range of 60% to 65%. There was no dynamic obstruction. Wall motion was normal; there were no regional wall motion abnormalities. - Aortic valve: There was trivial regurgitation directed centrally in the LVOT. - Mitral valve: Thickening. Prolapse. Although there was no diagnostic evidence for systolic anterior motion, this possibility cannot be completely excluded on the basis of this study. There was contractile dysfunction of the papillary muscles. No evidence of vegetation. There was severe regurgitation directed eccentrically and anteriorly. - Left atrium: The atrium was mildly dilated. - Atrial septum: No defect or patent foramen ovale was identified.  Intraoperative transesophageal echocardiography.  Birthdate: Patient birthdate: [birth year].  Age:  Patient is 78 yr old.  Sex: Gender: female.  Study date:  Study date: 07/29/2016. Study time: 09:21 AM.  -------------------------------------------------------------------  ------------------------------------------------------------------- Left ventricle:  Concern for SAM noted with elevated HR. Well visualized. The cavity size was normal. Wall thickness was normal. There was no hypertrophy. The estimated ejection fraction was in the range of 60% to 65%. There was no dynamic obstruction. Wall motion was normal; there were no regional wall motion abnormalities.  ------------------------------------------------------------------- Aortic valve:  Well visualized.  Normal-sized, noncalcified annulus. Structurally normal valve. The valve appears to be grossly normal. Trileaflet; normal thickness leaflets. Cusp separation was normal. Mobility was not restricted. No echocardiographic evidence for prolapse.  Doppler:   Transvalvular velocity was within the normal range. There was trivial regurgitation directed centrally in the LVOT.  ------------------------------------------------------------------- Aorta:   The arch was left-sided. Brachiocephalic branching was normal.   The aorta was well visualized, not dilated, non-tortuous, mildly calcified, and mildly diseased.  -------------------------------------------------------------------  Mitral valve:  P2 of posterior leaflet has considerable redundant tissue, flail segment appreciated at the P2-P3 junction. Severe eccentric MR with anterior jet. Normal velocities. Well visualized.  Severely thickened leaflets posterior. Thickening.  Prolapse. Although there was no diagnostic evidence for systolic anterior motion, this possibility cannot be completely excluded on the basis of this study. There was contractile dysfunction of the papillary muscles.  No evidence of vegetation. Doppler:  Transvalvular velocity was within the normal range. There was no evidence for stenosis. There was severe regurgitation directed eccentrically and anteriorly.  ------------------------------------------------------------------- Left atrium:  Well visualized. The atrium was mildly dilated. The atrial free wall was not hypertrophied.  ------------------------------------------------------------------- Atrial septum:  Well visualized. No defect or patent foramen ovale was identified.  ------------------------------------------------------------------- Right ventricle:  Well visualized. The cavity size was normal. Wall thickness was normal. There was no evidence of concentric hypertrophy. Systolic function was normal.  ------------------------------------------------------------------- Pulmonic valve:   Well visualized.  Normal-sized annulus. Structurally normal valve. The valve appears to be grossly normal. Cusp separation was  normal.  ------------------------------------------------------------------- Tricuspid valve:  Minimally dilated tricuspid annulus. Well visualized.  Structurally normal valve. The valve appears to be grossly normal. Normal thickness leaflets. Leaflet separation was normal.  Doppler:  Transvalvular velocity was within the normal range. There was no evidence for stenosis. There was trivial regurgitation directed centrally.  ------------------------------------------------------------------- Right atrium:  Well visualized. The atrium was normal in size.  ------------------------------------------------------------------- Pericardium:  The pericardium was normal in appearance.  ------------------------------------------------------------------- Post bypass:  - LV size was normal and unchanged from the prior stage. - The estimated LV ejection fraction was INVALID INPUT: >60%. - Normal wall motion; no LV regional wall motion abnormalities. - No evidence for new LV regional wall motion abnormalities.  Mitral valve:  No stenosis.  Trivial regurgitation, with a centrally directed regurgitant jet; severely decreased from the prior stage.  ------------------------------------------------------------------- Post procedure conclusions Ascending Aorta:  - The aorta was well visualized, not dilated, non-tortuous, mildly calcified, and mildly diseased.  ------------------------------------------------------------------- Prepared and Electronically Authenticated by  Franky Bald, MD 2017-09-08T19:32:54  MONITORS  LONG TERM MONITOR-LIVE TELEMETRY (3-14 DAYS) 09/20/2023  Narrative Indication:intermittent wide complex tachycardia  Duration: 13.75d  Findings HR  avg 77  Min 56-Max 130 PVCs Rare, less than 1% PACs Rare, less than 1% SVT Nonsustained 22 episodes; fastest 174 bpm for 5 beats; longest for 10 beats  at 133 bpm  Symptoms: none  Triggered: single event   NSR   Conclusions: No VT NS   Recommendations Continue current course       ______________________________________________________________________________________________      EKG:   EKG Interpretation Date/Time:  Tuesday August 15 2024 08:47:32 EDT Ventricular Rate:  64 PR Interval:  154 QRS Duration:  64 QT Interval:  396 QTC Calculation: 408 R Axis:   69  Text Interpretation: Normal sinus rhythm Possible Left atrial enlargement Septal infarct , age undetermined When compared with ECG of 30-Jul-2016 07:03, PR interval has decreased Septal infarct is now Present Nonspecific T wave abnormality no longer evident in Inferior leads Confirmed by Wonda Sharper [phone]) on 08/15/2024 9:00:06 AM    Recent Labs: No results found for requested labs within last 365 days.  Recent Lipid Panel No results found for: CHOL, TRIG, HDL, CHOLHDL, VLDL, LDLCALC, LDLDIRECT        Physical Exam:    VS:  BP (!) 140/70 (BP Location: Right Arm, Patient Position: Sitting, Cuff Size: Normal)   Pulse 64   Ht 5' 2.2 (  1.58 m)   Wt 99 lb 12.8 oz (45.3 kg)   SpO2 94%   BMI 18.14 kg/m     Wt Readings from Last 3 Encounters:  08/15/24 99 lb 12.8 oz (45.3 kg)  03/11/23 96 lb (43.5 kg)  11/12/21 100 lb 1.4 oz (45.4 kg)     GEN:  Well nourished, well developed in no acute distress HEENT: Normal NECK: No JVD; No carotid bruits LYMPHATICS: No lymphadenopathy CARDIAC: RRR, no murmurs, rubs, gallops RESPIRATORY:  Clear to auscultation without rales, wheezing or rhonchi  ABDOMEN: Soft, non-tender, non-distended MUSCULOSKELETAL:  No edema; No deformity  SKIN: Warm and dry NEUROLOGIC:  Alert and oriented x 3 PSYCHIATRIC:  Normal affect   Assessment & Plan Supraventricular tachycardia Advised Kardia mobile device.  I asked her to send me transmissions if she captures any arrhythmia during symptomatic episodes.  She is having fairly rare palpitations at present.  Continue  current medications. H/O mitral valve repair Her exam remains normal.  I reviewed her echo from last year which shows normal function of her mitral valve with no mitral regurgitation or stenosis.  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  Continue SBE prophylaxis when indicated. Mitral valve insufficiency, unspecified etiology S/P mitral valve repair with normal mitral valve function, asymptomatic.      Medication Adjustments/Labs and Tests Ordered: Current medicines are reviewed at length with the patient today.  Concerns regarding medicines are outlined above.  Orders Placed This Encounter  Procedures   EKG 12-Lead   No orders of the defined types were placed in this encounter.   There are no Patient Instructions on file for this visit.   Signed, Ozell Fell, MD  08/15/2024 9:28 AM    [facility]

## 2024-09-20 ENCOUNTER — Other Ambulatory Visit (HOSPITAL_COMMUNITY)

## 2024-10-31 ENCOUNTER — Ambulatory Visit: Admitting: Cardiovascular Disease

## 2024-11-29 ENCOUNTER — Other Ambulatory Visit: Payer: Self-pay

## 2024-11-29 DIAGNOSIS — Z9889 Other specified postprocedural states: Secondary | ICD-10-CM

## 2024-11-29 DIAGNOSIS — I340 Nonrheumatic mitral (valve) insufficiency: Secondary | ICD-10-CM

## 2025-01-02 ENCOUNTER — Ambulatory Visit (HOSPITAL_COMMUNITY)

## 2025-08-09 ENCOUNTER — Other Ambulatory Visit (HOSPITAL_COMMUNITY)
